# Patient Record
Sex: Female | Born: 1940 | Race: Black or African American | Hispanic: No | Smoking: Never smoker
Health system: Southern US, Community
[De-identification: ages and names within clinical notes are randomized; demographics above are authoritative.]

## PROBLEM LIST (undated history)

## (undated) DIAGNOSIS — I10 Essential (primary) hypertension: Secondary | ICD-10-CM

## (undated) DIAGNOSIS — F0390 Unspecified dementia without behavioral disturbance: Secondary | ICD-10-CM

## (undated) DIAGNOSIS — N189 Chronic kidney disease, unspecified: Secondary | ICD-10-CM

## (undated) DIAGNOSIS — Z87442 Personal history of urinary calculi: Secondary | ICD-10-CM

## (undated) DIAGNOSIS — M1990 Unspecified osteoarthritis, unspecified site: Secondary | ICD-10-CM

## (undated) HISTORY — PX: KNEE SURGERY: SHX244

---

## 2000-04-26 ENCOUNTER — Other Ambulatory Visit: Admission: RE | Admit: 2000-04-26 | Discharge: 2000-04-26 | Payer: Self-pay | Admitting: Family Medicine

## 2000-05-09 ENCOUNTER — Ambulatory Visit (HOSPITAL_COMMUNITY): Admission: RE | Admit: 2000-05-09 | Discharge: 2000-05-09 | Payer: Self-pay | Admitting: Family Medicine

## 2000-05-30 ENCOUNTER — Encounter: Admission: RE | Admit: 2000-05-30 | Discharge: 2000-05-30 | Payer: Self-pay | Admitting: Family Medicine

## 2000-05-30 ENCOUNTER — Encounter: Payer: Self-pay | Admitting: Family Medicine

## 2001-04-28 ENCOUNTER — Emergency Department (HOSPITAL_COMMUNITY): Admission: EM | Admit: 2001-04-28 | Discharge: 2001-04-28 | Payer: Self-pay

## 2001-04-28 ENCOUNTER — Encounter: Payer: Self-pay | Admitting: Emergency Medicine

## 2001-05-14 ENCOUNTER — Inpatient Hospital Stay (HOSPITAL_COMMUNITY): Admission: RE | Admit: 2001-05-14 | Discharge: 2001-05-17 | Payer: Self-pay | Admitting: Internal Medicine

## 2001-05-15 ENCOUNTER — Encounter: Payer: Self-pay | Admitting: Internal Medicine

## 2001-05-16 ENCOUNTER — Encounter: Payer: Self-pay | Admitting: Internal Medicine

## 2001-09-23 ENCOUNTER — Encounter: Admission: RE | Admit: 2001-09-23 | Discharge: 2001-09-23 | Payer: Self-pay | Admitting: Family Medicine

## 2001-09-23 ENCOUNTER — Encounter: Payer: Self-pay | Admitting: Family Medicine

## 2001-09-25 ENCOUNTER — Other Ambulatory Visit: Admission: RE | Admit: 2001-09-25 | Discharge: 2001-09-25 | Payer: Self-pay | Admitting: Family Medicine

## 2001-10-08 ENCOUNTER — Ambulatory Visit (HOSPITAL_COMMUNITY): Admission: RE | Admit: 2001-10-08 | Discharge: 2001-10-08 | Payer: Self-pay | Admitting: Family Medicine

## 2001-10-08 ENCOUNTER — Encounter: Payer: Self-pay | Admitting: Family Medicine

## 2002-06-29 ENCOUNTER — Encounter: Payer: Self-pay | Admitting: Emergency Medicine

## 2002-06-29 ENCOUNTER — Inpatient Hospital Stay (HOSPITAL_COMMUNITY): Admission: EM | Admit: 2002-06-29 | Discharge: 2002-07-02 | Payer: Self-pay | Admitting: Emergency Medicine

## 2007-03-08 ENCOUNTER — Ambulatory Visit: Payer: Self-pay | Admitting: Oncology

## 2007-03-27 LAB — CBC WITH DIFFERENTIAL/PLATELET
BASO%: 1.7 % (ref 0.0–2.0)
Eosinophils Absolute: 0 10*3/uL (ref 0.0–0.5)
MCV: 94.6 fL (ref 81.0–101.0)
MONO%: 11.9 % (ref 0.0–13.0)
NEUT#: 1.6 10*3/uL (ref 1.5–6.5)
RBC: 4.08 10*6/uL (ref 3.70–5.32)
RDW: 15 % — ABNORMAL HIGH (ref 11.3–14.5)
WBC: 3.2 10*3/uL — ABNORMAL LOW (ref 3.9–10.0)

## 2007-03-27 LAB — COMPREHENSIVE METABOLIC PANEL
AST: 17 U/L (ref 0–37)
Alkaline Phosphatase: 46 U/L (ref 39–117)
BUN: 36 mg/dL — ABNORMAL HIGH (ref 6–23)
Glucose, Bld: 95 mg/dL (ref 70–99)
Potassium: 4.4 mEq/L (ref 3.5–5.3)
Total Bilirubin: 0.7 mg/dL (ref 0.3–1.2)

## 2007-06-18 ENCOUNTER — Ambulatory Visit: Payer: Self-pay | Admitting: Oncology

## 2007-07-22 ENCOUNTER — Ambulatory Visit: Payer: Self-pay | Admitting: Oncology

## 2007-09-11 ENCOUNTER — Encounter: Admission: RE | Admit: 2007-09-11 | Discharge: 2007-09-11 | Payer: Self-pay | Admitting: Orthopedic Surgery

## 2007-10-10 ENCOUNTER — Inpatient Hospital Stay (HOSPITAL_COMMUNITY): Admission: RE | Admit: 2007-10-10 | Discharge: 2007-10-14 | Payer: Self-pay | Admitting: Orthopedic Surgery

## 2008-05-21 ENCOUNTER — Encounter: Admission: RE | Admit: 2008-05-21 | Discharge: 2008-05-21 | Payer: Self-pay | Admitting: Orthopedic Surgery

## 2009-04-28 ENCOUNTER — Encounter: Admission: RE | Admit: 2009-04-28 | Discharge: 2009-04-28 | Payer: Self-pay | Admitting: Orthopedic Surgery

## 2009-05-18 ENCOUNTER — Inpatient Hospital Stay (HOSPITAL_COMMUNITY): Admission: RE | Admit: 2009-05-18 | Discharge: 2009-05-23 | Payer: Self-pay | Admitting: Orthopedic Surgery

## 2009-05-22 ENCOUNTER — Ambulatory Visit: Payer: Self-pay | Admitting: Vascular Surgery

## 2009-05-22 ENCOUNTER — Encounter (INDEPENDENT_AMBULATORY_CARE_PROVIDER_SITE_OTHER): Payer: Self-pay | Admitting: Internal Medicine

## 2009-06-04 ENCOUNTER — Inpatient Hospital Stay (HOSPITAL_COMMUNITY): Admission: EM | Admit: 2009-06-04 | Discharge: 2009-06-07 | Payer: Self-pay | Admitting: Emergency Medicine

## 2009-06-05 ENCOUNTER — Encounter (INDEPENDENT_AMBULATORY_CARE_PROVIDER_SITE_OTHER): Payer: Self-pay | Admitting: Internal Medicine

## 2009-06-05 ENCOUNTER — Ambulatory Visit: Payer: Self-pay | Admitting: Vascular Surgery

## 2009-06-17 ENCOUNTER — Inpatient Hospital Stay (HOSPITAL_COMMUNITY): Admission: RE | Admit: 2009-06-17 | Discharge: 2009-06-25 | Payer: Self-pay | Admitting: Orthopedic Surgery

## 2009-06-21 ENCOUNTER — Ambulatory Visit: Payer: Self-pay | Admitting: Internal Medicine

## 2010-03-20 LAB — BODY FLUID CULTURE

## 2010-03-20 LAB — CBC
HCT: 27 % — ABNORMAL LOW (ref 36.0–46.0)
Hemoglobin: 10.6 g/dL — ABNORMAL LOW (ref 12.0–15.0)
Hemoglobin: 8.9 g/dL — ABNORMAL LOW (ref 12.0–15.0)
MCHC: 33.2 g/dL (ref 30.0–36.0)
MCV: 89.3 fL (ref 78.0–100.0)
MCV: 89.3 fL (ref 78.0–100.0)
Platelets: 206 10*3/uL (ref 150–400)
RBC: 3.02 MIL/uL — ABNORMAL LOW (ref 3.87–5.11)
RBC: 3.56 MIL/uL — ABNORMAL LOW (ref 3.87–5.11)
RDW: 21.1 % — ABNORMAL HIGH (ref 11.5–15.5)
WBC: 3.8 10*3/uL — ABNORMAL LOW (ref 4.0–10.5)

## 2010-03-20 LAB — COMPREHENSIVE METABOLIC PANEL WITH GFR
ALT: <8 U/L (ref 0–35)
AST: 17 U/L (ref 0–37)
Albumin: 2.9 g/dL — ABNORMAL LOW (ref 3.5–5.2)
Alkaline Phosphatase: 65 U/L (ref 39–117)
BUN: 18 mg/dL (ref 6–23)
CO2: 32 meq/L (ref 19–32)
Calcium: 8.5 mg/dL (ref 8.4–10.5)
Chloride: 97 meq/L (ref 96–112)
Creatinine, Ser: 1.37 mg/dL — ABNORMAL HIGH (ref 0.4–1.2)
GFR calc non Af Amer: 38 mL/min — ABNORMAL LOW
Glucose, Bld: 92 mg/dL (ref 70–99)
Potassium: 3.3 meq/L — ABNORMAL LOW (ref 3.5–5.1)
Sodium: 136 meq/L (ref 135–145)
Total Bilirubin: 0.7 mg/dL (ref 0.3–1.2)
Total Protein: 6.7 g/dL (ref 6.0–8.3)

## 2010-03-20 LAB — BASIC METABOLIC PANEL WITH GFR
BUN: 17 mg/dL (ref 6–23)
BUN: 17 mg/dL (ref 6–23)
CO2: 32 meq/L (ref 19–32)
CO2: 33 meq/L — ABNORMAL HIGH (ref 19–32)
Calcium: 7.7 mg/dL — ABNORMAL LOW (ref 8.4–10.5)
Calcium: 8.3 mg/dL — ABNORMAL LOW (ref 8.4–10.5)
Chloride: 96 meq/L (ref 96–112)
Chloride: 96 meq/L (ref 96–112)
Creatinine, Ser: 1.48 mg/dL — ABNORMAL HIGH (ref 0.4–1.2)
Creatinine, Ser: 1.49 mg/dL — ABNORMAL HIGH (ref 0.4–1.2)
GFR calc non Af Amer: 35 mL/min — ABNORMAL LOW
GFR calc non Af Amer: 35 mL/min — ABNORMAL LOW
Glucose, Bld: 100 mg/dL — ABNORMAL HIGH (ref 70–99)
Glucose, Bld: 93 mg/dL (ref 70–99)
Potassium: 3.4 meq/L — ABNORMAL LOW (ref 3.5–5.1)
Potassium: 3.4 meq/L — ABNORMAL LOW (ref 3.5–5.1)
Sodium: 133 meq/L — ABNORMAL LOW (ref 135–145)
Sodium: 138 meq/L (ref 135–145)

## 2010-03-20 LAB — PROTIME-INR
INR: 1.11 (ref 0.00–1.49)
INR: 1.15 (ref 0.00–1.49)
INR: 1.21 (ref 0.00–1.49)
INR: 1.4 (ref 0.00–1.49)
Prothrombin Time: 14.2 s (ref 11.6–15.2)
Prothrombin Time: 15.2 s (ref 11.6–15.2)
Prothrombin Time: 16.5 seconds — ABNORMAL HIGH (ref 11.6–15.2)
Prothrombin Time: 17 seconds — ABNORMAL HIGH (ref 11.6–15.2)
Prothrombin Time: 17.2 seconds — ABNORMAL HIGH (ref 11.6–15.2)

## 2010-03-20 LAB — GRAM STAIN

## 2010-03-20 LAB — APTT: aPTT: 31 s (ref 24–37)

## 2010-03-21 LAB — COMPREHENSIVE METABOLIC PANEL
ALT: 8 U/L (ref 0–35)
ALT: <8 U/L (ref 0–35)
AST: 20 U/L (ref 0–37)
AST: 22 U/L (ref 0–37)
Albumin: 2 g/dL — ABNORMAL LOW (ref 3.5–5.2)
BUN: 20 mg/dL (ref 6–23)
CO2: 23 mEq/L (ref 19–32)
CO2: 25 mEq/L (ref 19–32)
Chloride: 101 mEq/L (ref 96–112)
Chloride: 102 mEq/L (ref 96–112)
Creatinine, Ser: 1.5 mg/dL — ABNORMAL HIGH (ref 0.4–1.2)
Creatinine, Ser: 1.71 mg/dL — ABNORMAL HIGH (ref 0.4–1.2)
GFR calc Af Amer: 33 mL/min — ABNORMAL LOW (ref >60)
GFR calc Af Amer: 36 mL/min — ABNORMAL LOW (ref >60)
GFR calc non Af Amer: 27 mL/min — ABNORMAL LOW (ref >60)
GFR calc non Af Amer: 30 mL/min — ABNORMAL LOW (ref >60)
Glucose, Bld: 104 mg/dL — ABNORMAL HIGH (ref 70–99)
Sodium: 132 mEq/L — ABNORMAL LOW (ref 135–145)
Total Bilirubin: 1.1 mg/dL (ref 0.3–1.2)
Total Bilirubin: 1.2 mg/dL (ref 0.3–1.2)
Total Bilirubin: 1.8 mg/dL — ABNORMAL HIGH (ref 0.3–1.2)
Total Protein: 5 g/dL — ABNORMAL LOW (ref 6.0–8.3)

## 2010-03-21 LAB — DIFFERENTIAL
Basophils Absolute: 0 K/uL (ref 0.0–0.1)
Basophils Absolute: 0 10*3/uL (ref 0.0–0.1)
Basophils Absolute: 0 10*3/uL (ref 0.0–0.1)
Basophils Absolute: 0 10*3/uL (ref 0.0–0.1)
Basophils Relative: 1 % (ref 0–1)
Basophils Relative: 0 % (ref 0–1)
Eosinophils Absolute: 0.1 10*3/uL (ref 0.0–0.7)
Eosinophils Absolute: 0 10*3/uL (ref 0.0–0.7)
Eosinophils Absolute: 0 10*3/uL (ref 0.0–0.7)
Eosinophils Absolute: 0.1 10*3/uL (ref 0.0–0.7)
Eosinophils Relative: 2 % (ref 0–5)
Eosinophils Relative: 1 % (ref 0–5)
Eosinophils Relative: 2 % (ref 0–5)
Lymphocytes Relative: 21 % (ref 12–46)
Lymphocytes Relative: 13 % (ref 12–46)
Lymphocytes Relative: 7 % — ABNORMAL LOW (ref 12–46)
Lymphs Abs: 0.8 K/uL (ref 0.7–4.0)
Lymphs Abs: 0.4 10*3/uL — ABNORMAL LOW (ref 0.7–4.0)
Monocytes Absolute: 0.5 K/uL (ref 0.1–1.0)
Monocytes Absolute: 0.7 10*3/uL (ref 0.1–1.0)
Monocytes Relative: 13 % — ABNORMAL HIGH (ref 3–12)
Monocytes Relative: 15 % — ABNORMAL HIGH (ref 3–12)
Neutro Abs: 2.6 K/uL (ref 1.7–7.7)
Neutro Abs: 3.2 10*3/uL (ref 1.7–7.7)
Neutro Abs: 4.7 10*3/uL (ref 1.7–7.7)
Neutrophils Relative %: 64 % (ref 43–77)
Neutrophils Relative %: 79 % — ABNORMAL HIGH (ref 43–77)

## 2010-03-21 LAB — CARDIAC PANEL(CRET KIN+CKTOT+MB+TROPI)
CK, MB: 1.6 ng/mL (ref 0.3–4.0)
Relative Index: 1.1 (ref 0.0–2.5)
Relative Index: 1.3 (ref 0.0–2.5)
Total CK: 76 U/L (ref 7–177)
Troponin I: 0.01 ng/mL (ref 0.00–0.06)
Troponin I: 0.02 ng/mL (ref 0.00–0.06)

## 2010-03-21 LAB — CBC
HCT: 20.8 % — ABNORMAL LOW (ref 36.0–46.0)
HCT: 22.9 % — ABNORMAL LOW (ref 36.0–46.0)
HCT: 28 % — ABNORMAL LOW (ref 36.0–46.0)
HCT: 21.9 % — ABNORMAL LOW (ref 36.0–46.0)
HCT: 22.9 % — ABNORMAL LOW (ref 36.0–46.0)
HCT: 23 % — ABNORMAL LOW (ref 36.0–46.0)
HCT: 24.8 % — ABNORMAL LOW (ref 36.0–46.0)
Hemoglobin: 6.9 g/dL — CL (ref 12.0–15.0)
Hemoglobin: 7.5 g/dL — ABNORMAL LOW (ref 12.0–15.0)
Hemoglobin: 9.3 g/dL — ABNORMAL LOW (ref 12.0–15.0)
Hemoglobin: 7.9 g/dL — ABNORMAL LOW (ref 12.0–15.0)
MCHC: 33.3 g/dL (ref 30.0–36.0)
MCHC: 34 g/dL (ref 30.0–36.0)
MCHC: 34.5 g/dL (ref 30.0–36.0)
MCV: 96.8 fL (ref 78.0–100.0)
MCV: 92.3 fL (ref 78.0–100.0)
MCV: 93.6 fL (ref 78.0–100.0)
MCV: 94.8 fL (ref 78.0–100.0)
MCV: 95.5 fL (ref 78.0–100.0)
MCV: 96.1 fL (ref 78.0–100.0)
MCV: 97.6 fL (ref 78.0–100.0)
Platelets: 363 10*3/uL (ref 150–400)
Platelets: 106 10*3/uL — ABNORMAL LOW (ref 150–400)
Platelets: 83 10*3/uL — ABNORMAL LOW (ref 150–400)
Platelets: 87 10*3/uL — ABNORMAL LOW (ref 150–400)
Platelets: 88 10*3/uL — ABNORMAL LOW (ref 150–400)
Platelets: 93 10*3/uL — ABNORMAL LOW (ref 150–400)
RBC: 2.15 MIL/uL — ABNORMAL LOW (ref 3.87–5.11)
RBC: 2.53 MIL/uL — ABNORMAL LOW (ref 3.87–5.11)
RBC: 3.06 MIL/uL — ABNORMAL LOW (ref 3.87–5.11)
RBC: 2.41 MIL/uL — ABNORMAL LOW (ref 3.87–5.11)
RBC: 2.54 MIL/uL — ABNORMAL LOW (ref 3.87–5.11)
RDW: 20.1 % — ABNORMAL HIGH (ref 11.5–15.5)
RDW: 23.8 % — ABNORMAL HIGH (ref 11.5–15.5)
RDW: 15.3 % (ref 11.5–15.5)
RDW: 16 % — ABNORMAL HIGH (ref 11.5–15.5)
RDW: 17.2 % — ABNORMAL HIGH (ref 11.5–15.5)
WBC: 3.7 10*3/uL — ABNORMAL LOW (ref 4.0–10.5)
WBC: 4 K/uL (ref 4.0–10.5)
WBC: 4.2 10*3/uL (ref 4.0–10.5)
WBC: 5 10*3/uL (ref 4.0–10.5)
WBC: 5.3 10*3/uL (ref 4.0–10.5)
WBC: 5.9 10*3/uL (ref 4.0–10.5)

## 2010-03-21 LAB — BASIC METABOLIC PANEL
BUN: 20 mg/dL (ref 6–23)
BUN: 21 mg/dL (ref 6–23)
BUN: 22 mg/dL (ref 6–23)
BUN: 22 mg/dL (ref 6–23)
CO2: 27 mEq/L (ref 19–32)
CO2: 22 mEq/L (ref 19–32)
Calcium: 8.1 mg/dL — ABNORMAL LOW (ref 8.4–10.5)
Calcium: 8.3 mg/dL — ABNORMAL LOW (ref 8.4–10.5)
Chloride: 99 mEq/L (ref 96–112)
Chloride: 100 mEq/L (ref 96–112)
Chloride: 101 mEq/L (ref 96–112)
Creatinine, Ser: 1.27 mg/dL — ABNORMAL HIGH (ref 0.4–1.2)
Creatinine, Ser: 1.68 mg/dL — ABNORMAL HIGH (ref 0.4–1.2)
Creatinine, Ser: 1.79 mg/dL — ABNORMAL HIGH (ref 0.4–1.2)
GFR calc Af Amer: 49 mL/min — ABNORMAL LOW (ref >60)
GFR calc Af Amer: 51 mL/min — ABNORMAL LOW (ref >60)
GFR calc Af Amer: 34 mL/min — ABNORMAL LOW (ref >60)
GFR calc non Af Amer: 40 mL/min — ABNORMAL LOW (ref >60)
GFR calc non Af Amer: 28 mL/min — ABNORMAL LOW (ref >60)
Glucose, Bld: 108 mg/dL — ABNORMAL HIGH (ref 70–99)
Glucose, Bld: 85 mg/dL (ref 70–99)
Glucose, Bld: 108 mg/dL — ABNORMAL HIGH (ref 70–99)
Glucose, Bld: 144 mg/dL — ABNORMAL HIGH (ref 70–99)
Potassium: 3.6 mEq/L (ref 3.5–5.1)
Potassium: 4.8 mEq/L (ref 3.5–5.1)
Potassium: 4.8 mEq/L (ref 3.5–5.1)
Potassium: 5.9 mEq/L — ABNORMAL HIGH (ref 3.5–5.1)
Sodium: 135 mEq/L (ref 135–145)

## 2010-03-21 LAB — MAGNESIUM
Magnesium: 1.8 mg/dL (ref 1.5–2.5)
Magnesium: 1.8 mg/dL (ref 1.5–2.5)

## 2010-03-21 LAB — CROSSMATCH
ABO/RH(D): O POS
ABO/RH(D): O POS
Antibody Screen: NEGATIVE

## 2010-03-21 LAB — PROTIME-INR
INR: 2.24 — ABNORMAL HIGH (ref 0.00–1.49)
INR: 2.74 — ABNORMAL HIGH (ref 0.00–1.49)
INR: 3.54 — ABNORMAL HIGH (ref 0.00–1.49)
INR: 4.08 — ABNORMAL HIGH (ref 0.00–1.49)
INR: 2.03 — ABNORMAL HIGH (ref 0.00–1.49)
Prothrombin Time: 39.3 seconds — ABNORMAL HIGH (ref 11.6–15.2)
Prothrombin Time: 22.8 seconds — ABNORMAL HIGH (ref 11.6–15.2)

## 2010-03-21 LAB — SAMPLE TO BLOOD BANK

## 2010-03-21 LAB — BLOOD GAS, ARTERIAL
Bicarbonate: 21.4 mEq/L (ref 20.0–24.0)
FIO2: 0.21 %
Patient temperature: 98.6
pCO2 arterial: 42.2 mmHg (ref 35.0–45.0)
pH, Arterial: 7.326 — ABNORMAL LOW (ref 7.350–7.400)

## 2010-03-21 LAB — BASIC METABOLIC PANEL WITH GFR
Calcium: 8.4 mg/dL (ref 8.4–10.5)
GFR calc non Af Amer: 42 mL/min — ABNORMAL LOW (ref >60)
Potassium: 4 meq/L (ref 3.5–5.1)
Sodium: 134 meq/L — ABNORMAL LOW (ref 135–145)

## 2010-03-21 LAB — PREPARE RBC (CROSSMATCH)

## 2010-03-21 LAB — HEMOCCULT GUIAC POC 1CARD (OFFICE): Fecal Occult Bld: NEGATIVE

## 2010-03-21 LAB — HEMOGLOBIN AND HEMATOCRIT, BLOOD: Hemoglobin: 8 g/dL — ABNORMAL LOW (ref 12.0–15.0)

## 2010-03-22 LAB — CBC
HCT: 34.3 % — ABNORMAL LOW (ref 36.0–46.0)
Platelets: 115 10*3/uL — ABNORMAL LOW (ref 150–400)
WBC: 2.9 10*3/uL — ABNORMAL LOW (ref 4.0–10.5)

## 2010-03-22 LAB — TYPE AND SCREEN
ABO/RH(D): O POS
Antibody Screen: NEGATIVE

## 2010-03-22 LAB — URINE MICROSCOPIC-ADD ON

## 2010-03-22 LAB — COMPREHENSIVE METABOLIC PANEL
ALT: 16 U/L (ref 0–35)
Albumin: 3.4 g/dL — ABNORMAL LOW (ref 3.5–5.2)
Alkaline Phosphatase: 53 U/L (ref 39–117)
BUN: 24 mg/dL — ABNORMAL HIGH (ref 6–23)
Chloride: 108 mEq/L (ref 96–112)
Glucose, Bld: 97 mg/dL (ref 70–99)
Potassium: 3.9 mEq/L (ref 3.5–5.1)
Sodium: 140 mEq/L (ref 135–145)
Total Bilirubin: 1.3 mg/dL — ABNORMAL HIGH (ref 0.3–1.2)

## 2010-03-22 LAB — URINALYSIS, ROUTINE W REFLEX MICROSCOPIC
Bilirubin Urine: NEGATIVE
Glucose, UA: NEGATIVE mg/dL
Specific Gravity, Urine: 1.018 (ref 1.005–1.030)
Urobilinogen, UA: 0.2 mg/dL (ref 0.0–1.0)

## 2010-03-22 LAB — PROTIME-INR
INR: 0.98 (ref 0.00–1.49)
Prothrombin Time: 12.9 seconds (ref 11.6–15.2)

## 2010-05-06 ENCOUNTER — Inpatient Hospital Stay (HOSPITAL_COMMUNITY)
Admission: AD | Admit: 2010-05-06 | Discharge: 2010-05-11 | DRG: 488 | Disposition: A | Discharge status: Home Health | Payer: Medicare Other | Source: Ambulatory Visit | Attending: Orthopedic Surgery | Admitting: Orthopedic Surgery

## 2010-05-06 DIAGNOSIS — N289 Disorder of kidney and ureter, unspecified: Secondary | ICD-10-CM | POA: Diagnosis present

## 2010-05-06 DIAGNOSIS — T84039A Mechanical loosening of unspecified internal prosthetic joint, initial encounter: Secondary | ICD-10-CM | POA: Diagnosis present

## 2010-05-06 DIAGNOSIS — M25069 Hemarthrosis, unspecified knee: Secondary | ICD-10-CM | POA: Diagnosis present

## 2010-05-06 DIAGNOSIS — T8489XA Other specified complication of internal orthopedic prosthetic devices, implants and grafts, initial encounter: Principal | ICD-10-CM | POA: Diagnosis present

## 2010-05-06 DIAGNOSIS — Z96659 Presence of unspecified artificial knee joint: Secondary | ICD-10-CM

## 2010-05-06 DIAGNOSIS — H9190 Unspecified hearing loss, unspecified ear: Secondary | ICD-10-CM | POA: Diagnosis present

## 2010-05-06 DIAGNOSIS — M129 Arthropathy, unspecified: Secondary | ICD-10-CM | POA: Diagnosis present

## 2010-05-06 DIAGNOSIS — Y831 Surgical operation with implant of artificial internal device as the cause of abnormal reaction of the patient, or of later complication, without mention of misadventure at the time of the procedure: Secondary | ICD-10-CM | POA: Diagnosis present

## 2010-05-06 DIAGNOSIS — I10 Essential (primary) hypertension: Secondary | ICD-10-CM | POA: Diagnosis present

## 2010-05-06 LAB — CBC
Hemoglobin: 10.7 g/dL — ABNORMAL LOW (ref 12.0–15.0)
MCH: 31.1 pg (ref 26.0–34.0)
MCHC: 31.6 g/dL (ref 30.0–36.0)
Platelets: 186 10*3/uL (ref 150–400)
RDW: 16.1 % — ABNORMAL HIGH (ref 11.5–15.5)

## 2010-05-06 LAB — SEDIMENTATION RATE: Sed Rate: 20 mm/hr (ref 0–22)

## 2010-05-06 LAB — COMPREHENSIVE METABOLIC PANEL
ALT: 5 U/L (ref 0–35)
BUN: 24 mg/dL — ABNORMAL HIGH (ref 6–23)
CO2: 30 mEq/L (ref 19–32)
Calcium: 9.1 mg/dL (ref 8.4–10.5)
Creatinine, Ser: 1.41 mg/dL — ABNORMAL HIGH (ref 0.4–1.2)
GFR calc non Af Amer: 37 mL/min — ABNORMAL LOW (ref >60)
Glucose, Bld: 96 mg/dL (ref 70–99)
Sodium: 141 mEq/L (ref 135–145)
Total Protein: 7 g/dL (ref 6.0–8.3)

## 2010-05-06 LAB — URIC ACID: Uric Acid, Serum: 7.6 mg/dL — ABNORMAL HIGH (ref 2.4–7.0)

## 2010-05-06 LAB — PROTIME-INR
INR: 0.97 (ref 0.00–1.49)
INR: 0.97 (ref 0.00–1.49)
Prothrombin Time: 13.1 seconds (ref 11.6–15.2)

## 2010-05-07 ENCOUNTER — Inpatient Hospital Stay (HOSPITAL_COMMUNITY): Payer: Medicare Other

## 2010-05-07 DIAGNOSIS — M79609 Pain in unspecified limb: Secondary | ICD-10-CM

## 2010-05-07 LAB — SYNOVIAL CELL COUNT + DIFF, W/ CRYSTALS
Monocyte-Macrophage-Synovial Fluid: 2 % — ABNORMAL LOW (ref 50–90)
Neutrophil, Synovial: 70 % — ABNORMAL HIGH (ref 0–25)
WBC, Synovial: 2098 /mm3 — ABNORMAL HIGH (ref 0–200)

## 2010-05-09 ENCOUNTER — Inpatient Hospital Stay (HOSPITAL_COMMUNITY): Payer: Medicare Other

## 2010-05-09 LAB — PROTIME-INR: INR: 1.05 (ref 0.00–1.49)

## 2010-05-09 LAB — BASIC METABOLIC PANEL
Calcium: 8.4 mg/dL (ref 8.4–10.5)
Creatinine, Ser: 1.42 mg/dL — ABNORMAL HIGH (ref 0.4–1.2)
GFR calc Af Amer: 44 mL/min — ABNORMAL LOW (ref >60)
GFR calc non Af Amer: 37 mL/min — ABNORMAL LOW (ref >60)
Sodium: 140 mEq/L (ref 135–145)

## 2010-05-09 MED ORDER — TECHNETIUM TC 99M MEDRONATE IV KIT
25.0000 | PACK | Freq: Once | INTRAVENOUS | Status: AC | PRN
  Administered 2010-05-09: 25 via INTRAVENOUS

## 2010-05-10 ENCOUNTER — Inpatient Hospital Stay (HOSPITAL_COMMUNITY): Payer: Medicare Other

## 2010-05-10 LAB — BODY FLUID CULTURE: Culture: NO GROWTH

## 2010-05-10 LAB — PROTIME-INR
INR: 1.04 (ref 0.00–1.49)
Prothrombin Time: 13.8 seconds (ref 11.6–15.2)

## 2010-05-11 LAB — ANAEROBIC CULTURE

## 2010-05-11 LAB — CULTURE, ROUTINE-ABSCESS

## 2010-05-13 LAB — ANAEROBIC CULTURE

## 2010-05-17 NOTE — Op Note (Signed)
Sharon Ramos, Sharon Ramos            ACCOUNT NO.:  [id number]   MEDICAL RECORD NO.:  [id number]          PATIENT TYPE:  INP   LOCATION:  5041                         FACILITY:  [facility]   PHYSICIAN:  Myrtie Neither, MD      DATE OF BIRTH:  04-11-1940   DATE OF PROCEDURE:  10/10/2007  DATE OF DISCHARGE:                               OPERATIVE REPORT   PREOPERATIVE DIAGNOSIS:  Degenerative joint disease, left knee.   POSTOPERATIVE DIAGNOSIS:  Degenerative joint disease, left knee.   ANESTHESIA:  General.   PROCEDURE:  Left total knee arthroplasty, Biomet implant.  The patient  was taken to the operating room.  After giving the adequate preop  medication, general anesthesia was intubated.  Left lower extremity was  prepped with DuraPrep and draped in sterile manner.  Tourniquet and  Bovie was used for hemostasis.  Anterior midline incision was made over  the left knee.  Going through the skin and subcutaneous tissue, a  paramedian incision was made from about the capsule.  Patella reflected  laterally.  Osteophytes about the patella, femur, and tibia were  resected.  Soft tissue resection was done.  The patient had varus  deformity.  Medial soft-tissue release was done to bring the knee into a  neutral to slight valgus position.  With the knee in a flexed position,  10 mm of the tibial plateau surface was resected with the tibial guide  followed by reaming down the femoral canal and distal femoral jig put in  place.  An appropriate cut for the distal femur was done.  Sizing was  that of 72.5 mm.  An appropriate 72.5-mm jig was put in place.  Anterior  and posterior cuts and chamfer cuts were done.  Further soft tissue  resection about the femur was also done.  Multiple loose bodies were in  the joint and were removed.  Femoral component was press fitted and  found to be a good snug fit.  Attention was then turned back to the  tibia, which was sized at 75-mm and appropriate cutting jig for  the  tibial surface was done.  Trial for the tibia and femur were put in  place.  Range of motion was full extension, full flexion with good  medial and lateral stability with the 12-mm poly.  Sizing of the patella  was then done, which was a large 37 mm and the appropriate cutting jig  was put in place, and surfacing was done.  With all 3 components in  place, there was full extension, good flexion, good medial and lateral  stability, and no subluxation of the patella was noted.  Trial  components were removed.  Irrigation was then done with the same pulse  irrigator.  Methyl methacrylate was mixed.  The patella and the tibial  components were cemented and the femoral component was press fitted.  After methyl methacrylate had set an excess methyl methacrylate was  removed.  A 12-mm trial poly was then put back in place and again full  extension, full flexion, good medial lateral stability, and no  subluxation of the patella.  Final 12-mm poly was then put in place and  locked.  A tourniquet was let down.  Hemostasis obtained.  Further  irrigation was done.  Wound closure was then done with 0 Vicryl for the  fascia, 2-0 for the subcutaneous and skin staples for the skin.  Notation of the allergic skin reaction was noted along the incision from  the iodine drape.  Triple antibiotic ointment was applied to the  incision area.  Compressive dressing was applied.  The patient tolerated  procedure quite well in the recovery room in stable and satisfactory  condition.  The patient's blood loss approximately 250 mL.     Myrtie Neither, MD  Electronically Signed    AC/MEDQ  D:  10/10/2007  T:  10/10/2007  Job:  [id number]

## 2010-05-19 ENCOUNTER — Inpatient Hospital Stay (HOSPITAL_COMMUNITY)
Admission: AD | Admit: 2010-05-19 | Discharge: 2010-05-25 | DRG: 488 | Disposition: A | Discharge status: Home / Self Care | Payer: Medicare Other | Source: Ambulatory Visit | Attending: Orthopedic Surgery | Admitting: Orthopedic Surgery

## 2010-05-19 DIAGNOSIS — I252 Old myocardial infarction: Secondary | ICD-10-CM

## 2010-05-19 DIAGNOSIS — R233 Spontaneous ecchymoses: Secondary | ICD-10-CM | POA: Diagnosis present

## 2010-05-19 DIAGNOSIS — IMO0002 Reserved for concepts with insufficient information to code with codable children: Secondary | ICD-10-CM | POA: Diagnosis present

## 2010-05-19 DIAGNOSIS — M25069 Hemarthrosis, unspecified knee: Principal | ICD-10-CM | POA: Diagnosis present

## 2010-05-19 DIAGNOSIS — T45515A Adverse effect of anticoagulants, initial encounter: Secondary | ICD-10-CM | POA: Diagnosis present

## 2010-05-19 DIAGNOSIS — H9190 Unspecified hearing loss, unspecified ear: Secondary | ICD-10-CM | POA: Diagnosis present

## 2010-05-19 DIAGNOSIS — Y998 Other external cause status: Secondary | ICD-10-CM | POA: Diagnosis present

## 2010-05-19 DIAGNOSIS — B965 Pseudomonas (aeruginosa) (mallei) (pseudomallei) as the cause of diseases classified elsewhere: Secondary | ICD-10-CM | POA: Diagnosis present

## 2010-05-19 DIAGNOSIS — I10 Essential (primary) hypertension: Secondary | ICD-10-CM | POA: Diagnosis present

## 2010-05-19 DIAGNOSIS — D62 Acute posthemorrhagic anemia: Secondary | ICD-10-CM | POA: Diagnosis not present

## 2010-05-19 NOTE — Op Note (Signed)
  NAME:  Sharon Ramos, Sharon Ramos            ACCOUNT NO.:  [id number]  MEDICAL RECORD NO.:  [id number]           PATIENT TYPE:  I  LOCATION:  5005                         FACILITY:  [facility]  PHYSICIAN:  Myrtie Neither, MD      DATE OF BIRTH:  19-Nov-1940  DATE OF PROCEDURE:  05/08/2010 DATE OF DISCHARGE:                              OPERATIVE REPORT   PREOPERATIVE DIAGNOSIS:  Hemarthrosis, right knee, rule out infection of right knee.  POSTOPERATIVE DIAGNOSIS:  Hemarthrosis, right knee, rule out infection of right knee.  ANESTHESIA:  General.  PROCEDURE:  Arthrotomy, right knee and application of Hemovac drain, right knee.  The patient was taken to the operating room after given adequate preoperative medications, given general anesthesia and intubated.  Right lower extremity was prepped with DuraPrep and draped in sterile manner. Tourniquet and Bovie used for hemostasis.  Anterior midline incision made going through old previous scar going through the skin and subcutaneous tissue and medial paramedian incision made into the capsule.  Immediately after opening the capsule, large hematoma was evacuated from the knee extending suprapatellar pouch and no evidence of any purulent material.  The subcutaneous tissue also appeared to be bruised anteriorly over the knee.  Inspection did not reveal any particular source of bleeding.  Copious irrigation with a Simpulse irrigator was done.  Aerobic and anaerobic cultures were also done after adequate evacuation of hematoma and some debridement of the synovium. Hemostasis was obtained.  Further irrigation was then done.  Wound closure was then done with 0 Vicryl for the fascia, 2-0 for the subcutaneous, and skin staples for the skin.  Large Hemovac drain was placed into the knee.  Compressive dressing was applied.  The patient tolerated the procedure quite well, went to recovery room in stable and satisfactory condition.     Myrtie Neither,  MD     AC/MEDQ  D:  05/08/2010  T:  05/08/2010  Job:  [id number]  Electronically Signed by Myrtie Neither MD on 05/19/2010 03:24:09 PM

## 2010-05-19 NOTE — Discharge Summary (Signed)
NAME:  Sharon Ramos, WINDHORST            ACCOUNT NO.:  [id number]  MEDICAL RECORD NO.:  [id number]           PATIENT TYPE:  I  LOCATION:  5005                         FACILITY:  [facility]  PHYSICIAN:  Myrtie Neither, MD      DATE OF BIRTH:  09-06-1940  DATE OF ADMISSION:  05/06/2010 DATE OF DISCHARGE:  05/11/2010                              DISCHARGE SUMMARY   ADMITTING DIAGNOSES:  Painful right total knee implant, effusion, rule out infection, rule out loosening right total knee, venous insufficiency, rule out DVT, history of gouty arthropathy.  DISCHARGE DIAGNOSIS:  Hemarthrosis, right total knee, loosening right total knee, rule out infection in the right total knee.  COMPLICATIONS:  None.  INFECTIONS:  No growth, right knee.  PROCEDURES:  Arthrocentesis, right knee; arthrotomy, right knee; gram stain cultures, aerobic and anaerobic done.  PICC line placed in the right arm for home IV antibiotics.  PERTINENT HISTORY:  This is a 70 year old female, deaf patient, who underwent right total knee arthroplasty last year.  The patient recently developed gouty arthropathy involving the right knee with pain, swelling, and effusion.  The patient was treated with Decadron with colchicine, allopurinol, the patient's symptoms subsided.  The patient had a uric acid of 9.7 two to three weeks ago.  The patient returned to the office on May 06, 2010 with severe onset of pain, swelling, inability to weightbear within a 24-hour period.  The patient was admitted to the hospital for extensive leg swelling, pain, swelling of the right total knee, for ruling out of infection right knee, loosening right total knee, rule out DVT right lower extremity.  Pertinent physical was that of the right knee of +3 effusion, markedly with tender, limited range of motion, no increased warmth or redness, tender right lower leg, trace Homans' test, swelling all the way down to the dorsal of the foot.  HOSPITAL COURSE:   The patient was admitted to hospital and underwent arthrocentesis, had 60 mL of dark non-coagulated blood, this was sent for cultures and sensitivity, Gram stain, and uric acid crystals.  The patient's laboratory revealed a uric acid of 7.6, which is down from 9.7.  Labs on her fluid revealed an elevated white blood cell count of 2098, no crystals, no organisms.  The patient's INR was 0.97, sed rate was 20.  CBC:  White blood cell count was 6.7, H and H was 10.7 and 33.9.  The patient had previous CT scan of the right knee, which did not show any evidence of loosening.  Venous Doppler revealed no evidence of DVT.  The patient was taken to the operating room on May 08, 2010, and underwent arthrotomy.  The patient had evacuation of large hematoma from the knee.  No evidence of any purulent material.  The patient had debridement, irrigation, and placement of Hemovac wound.  Cultures again with aerobic and anaerobic and gram stain.  The patients wound was closed with the use of Hemovac.  Postop course pain became uncontrolled. The patient remained afebrile.  The patient was started on IV vancomycin postoperatively.  The patient was also started on prophylactic Coumadin and started with physical therapy,  weightbearing as tolerated on the right knee.  The patient's bone scan was read as loosened total knee arthroplasty, possible infection.  Specimen and cultures did not show any growth so for.  Pain is under control.  Wound is healing quite well due to the fact that did not show what bacterium would be retreating if any.  We will discharge the patient on PICC line with continued vancomycin and also we will use rifampin presently considering 3 weeks until final cultures are seen.  The patient will be discharged on Coumadin 5 mg daily, Percocet one q.8 p.r.n., Feosol 325 mg b.i.d.  The patient's daughter Milda Smart was instructed as to care the mother.  Home health has been arranged for IV antibiotics  as well as care of the PICC line and checking of INR as well as her Coumadin dose. The patient being discharged in stable and satisfactory condition.     Myrtie Neither, MD     AC/MEDQ  D:  05/11/2010  T:  05/12/2010  Job:  [id number]  Electronically Signed by Myrtie Neither MD on 05/19/2010 03:24:04 PM

## 2010-05-20 LAB — COMPREHENSIVE METABOLIC PANEL
AST: 12 U/L (ref 0–37)
Albumin: 3 g/dL — ABNORMAL LOW (ref 3.5–5.2)
BUN: 30 mg/dL — ABNORMAL HIGH (ref 6–23)
Calcium: 8.4 mg/dL (ref 8.4–10.5)
Creatinine, Ser: 1.63 mg/dL — ABNORMAL HIGH (ref 0.4–1.2)
GFR calc Af Amer: 38 mL/min — ABNORMAL LOW (ref >60)
Total Bilirubin: 0.2 mg/dL — ABNORMAL LOW (ref 0.3–1.2)
Total Protein: 6 g/dL (ref 6.0–8.3)

## 2010-05-20 LAB — URINALYSIS, ROUTINE W REFLEX MICROSCOPIC
Glucose, UA: NEGATIVE mg/dL
Protein, ur: NEGATIVE mg/dL
Specific Gravity, Urine: 1.011 (ref 1.005–1.030)
pH: 6 (ref 5.0–8.0)

## 2010-05-20 LAB — CBC
HCT: 23.8 % — ABNORMAL LOW (ref 36.0–46.0)
MCH: 30.8 pg (ref 26.0–34.0)
MCV: 94.1 fL (ref 78.0–100.0)
Platelets: 164 10*3/uL (ref 150–400)
RBC: 2.53 MIL/uL — ABNORMAL LOW (ref 3.87–5.11)
RDW: 16.1 % — ABNORMAL HIGH (ref 11.5–15.5)
WBC: 3.4 10*3/uL — ABNORMAL LOW (ref 4.0–10.5)

## 2010-05-20 LAB — PROTIME-INR: INR: 1.08 (ref 0.00–1.49)

## 2010-05-20 LAB — HEMOGLOBIN AND HEMATOCRIT, BLOOD: Hemoglobin: 9 g/dL — ABNORMAL LOW (ref 12.0–15.0)

## 2010-05-20 LAB — SURGICAL PCR SCREEN
MRSA, PCR: NEGATIVE
Staphylococcus aureus: NEGATIVE

## 2010-05-20 LAB — APTT: aPTT: 30 seconds (ref 24–37)

## 2010-05-20 NOTE — Discharge Summary (Signed)
NAME:  Sharon Ramos, Sharon Ramos                      ACCOUNT NO.:  [id number]   MEDICAL RECORD NO.:  [id number]                   PATIENT TYPE:  INP   LOCATION:  0363                                 FACILITY:  [facility]   PHYSICIAN:  Lilyan Punt. Sydnee Levans, M.D.             DATE OF BIRTH:  January 09, 1940   DATE OF ADMISSION:  06/28/2002  DATE OF DISCHARGE:  07/02/2002                                 DISCHARGE SUMMARY   DISCHARGE DIAGNOSES:  1. Pyuria - urinary culture subsequently grew Escherichia coli.  2. Renal failure, acute, secondary to dehydration, hypertension, and     presumed thiazide diuretic treatment,  in the setting of urinary tract     infection.  3. Anemia.  4. Hypertension.  5. Diarrhea and vomiting, resolved.  6. Deafness.  7. Sciatica.   PROCEDURE/DATE/FINDINGS:  1. CT of the abdomen on June 29, 2002, indications of ARF, hypertension,     vomiting and diarrhea.  The findings include cholelithiasis, small     hepatic cyst and a 1.0 cm low-density lesion in the lateral aspect of the     left kidney which was nonspecific, which may be followed up as an     outpatient with an ultrasound for further characterization.  2. A CT of the pelvis on June 29, 2002:  Impression was no significant     abnormality.  3. A chest x-ray on June 29, 2002:  Mild to moderate cardiomegaly, with     prominence of interstitial pulmonary markings likely secondary to     pulmonary edema and hypertension, though there is no evidence of frank     edema or pleural effusion, nor is there evidence of an acute pulmonary     infiltrate.   HISTORY OF PRESENT ILLNESS:  This is a 70 year old black female who normally  sees Dr. Darl Pikes Drinkard at [facility].  She has a history of hypertension,  treated with thiazide-type diuretics per available records.  She presented  to [facility] with low back pain, a constellation of  subjective fevers, a diminished appetite, increased vomiting and  diarrhea of  two days' duration.  There was also mild dysuria and passage of foul-  smelling urine.  See also the admit note by Dr. Hortencia Pilar.   LABORATORY DATA:  The admit labs are notable for a white blood cell count of  13.3, hemoglobin 12.2, hematocrit 34.9.  BMET showed a sodium of 130,  potassium 2.6, elevated BUN at 7.4, creatinine 4.3.  A urinalysis showed  pyuria and subsequently grew out E. coli.   HOSPITAL COURSE:  She was admitted for further evaluation and treatment.  She was empirically begun on renally-based IV Cipro for the treatment of  uropathogens.  She was given IV rehydration.  A CT of the kidneys with the  findings as noted above.  Her back pain was reproducible on palpation, and  seeming more consistent with sciatica.  Hypokalemia was corrected with IV  fluids.  Over the ensuing days in the hospital she appeared to respond to IV  hydration and antibiotics.  She had a low-grade fever which resolved.  The  diarrhea resolved.  The renal status improved gradually, and at the present  time her creatinine is 2.8, BUN 51, potassium 3.8, sodium 136.   FOLLOW UP:  She will have further followup through [facility], as well as  nephrology.  She will continue Cipro for an additional three days in the outpatient  setting.    DISCHARGE MEDICATIONS:  1. Cipro 500 mg b.i.d. for three additional days.  2. Antihypertensive - This was discontinued this hospitalization because of     her renal status.  She understands the need for further followup.                                               Lilyan Punt Sydnee Levans, M.D.    KCS/MEDQ  D:  07/02/2002  T:  07/02/2002  Job:  [id number]   cc:   [facility]

## 2010-05-20 NOTE — H&P (Signed)
[place]. [facility]  Patient:    Sharon Ramos, Sharon Ramos Visit Number: [id number] MRN: [id number]          Service Type: MED Location: [phone] Attending Physician:  Dolores Patty Dictated by:   Titus Dubin. Alwyn Ren, M.D. [facility] Admit Date:  05/14/2001   CC:         Sharon Ramos. Audria Nine, M.D., [facility]   History and Physical  DATE OF BIRTH:  11-18-1940  HISTORY OF PRESENT ILLNESS:  Sharon Ramos is a 70 year old African American deaf female with calf pain and dramatic pedal edema.  She had fallen and injured her ankle and was placed on naproxen, apparently. The injury apparently was approximately two weeks prior to admission; those records are not in the [facility] chart.  She now presents with progressive edema which she attributes to the nonsteroidal anti-inflammatory agent.  In actuality, she has not taken the naproxen for one week.  The original injury was apparently on April 26th.  There was no evidence of acute fracture but there was periosteal elevation associated with the medial malleolus.  An underlying neoplastic process could not be excluded.  An MRI was recommended.  There is no record of such being completed.  Degenerative changes were also noted.  In the clinic, she was noted to have 2+ edema of the feet and marked tenderness of the left calf superiorly, suggesting a positive Homans.  She was admitted for probable deep venous thrombosis.  PAST MEDICAL HISTORY:  Past medical history includes seven pregnancies and six deliveries and one lost pregnancy.  SOCIAL HISTORY:  She does not drink or smoke.  She has a supportive family; her granddaughter signs for her.  FAMILY HISTORY:  Her mother died of cancer and father died of myocardial infarction.  ALLERGIES:  There are no known drug allergies.  MEDICATIONS:  She is on no medicines at present.  REVIEW OF SYSTEMS:  Review of systems is otherwise negative.   She had been on a diuretic in the past according to the [facility] notes.  PHYSICAL EXAMINATION:  VITAL SIGNS:  She is 5 feet 6 inches and weighs 268 pounds.  Temperature was 97, pulse was 82 and respiratory rate 20.  Blood pressure was 146/82 on the left and 140/80 on the right.  HEENT:  Arteriolar narrowing is noted.  Tympanic membranes are dull but there are no otic canal obstructions noted.  Dental hygiene is noted.  She is deaf, as noted.  LUNGS:  Breath sounds are decreased but there is no increased work of breathing.  An S4 is noted.  She moves slowly with a shuffling-type gait and uses a quad cane.  ABDOMEN:  Protuberant with doughy consistency.  EXTREMITIES:  Posterior tibial pulses are decreased related to the dramatic pedal edema.  The dorsalis pedis pulses are palpable but reduced.  As noted, she is exquisitely tender in the left superomedial calf.  Ambulation was markedly limited and she could not get on the exam table.  NEUROLOGIC:  There are no localizing neurologic signs, although there is slight facial asymmetry of the nasolabial folds.  GENITOURINARY AND BREASTS:  Exams are deferred as they are not germane to this admission.  ASSESSMENT AND PLAN:  She is now admitted because of calf pain in the context of a previous musculoskeletal injury with dramatic edema.  Although she attributes the edema to the nonsteroidal anti-inflammatory agent, she has not taken the medicine for a week, so this would be unlikely.  Other issues include the congenital deafness and minimal hypertension.  Lovenox will be initiated pending the results of the CT of the legs or venous Doppler.  She is a patient at [facility].  I was serving as a Teacher, music the night of admission.  Because of her deafness and the fact that her granddaughter was not able to accompany her to the emergency room, I admitted her to my service.  At discharge, she will return to the care of  the [facility] physicians.  I believe that simply sending this lady to the emergency room would delay treatment for potentially health- or life-threatening condition.  Sodium restriction and diuretics will be prescribed for the edema.  Renal function will be monitored. Dictated by:   Titus Dubin. Alwyn Ren, M.D. [facility] Attending Physician:  Dolores Patty DD:  05/15/01 TD:  05/16/01 Job: [id number]

## 2010-05-20 NOTE — Discharge Summary (Signed)
   NAME:  Sharon Ramos, Sharon Ramos                      ACCOUNT NO.:  [id number]   MEDICAL RECORD NO.:  [id number]                   PATIENT TYPE:  INP   LOCATION:  0363                                 FACILITY:  [facility]   PHYSICIAN:  Lilyan Punt. Sydnee Levans, M.D.             DATE OF BIRTH:  06/13/1940   DATE OF ADMISSION:  06/28/2002  DATE OF DISCHARGE:                                 DISCHARGE SUMMARY   ADDENDUM:   PERTINENT LABORATORIES:  On 07/01/2002 CBC revealed a hemoglobin of 10.7,  hematocrit 31.3, white blood cell count 10.2.  Iron studies: B12 greater  than 1000, folate 8.  Complement C3 normal at 121.  ANA pending.  E. coli  sensitivities pan-sensitive including to ciprofloxacin.  ESR 88.  Total CK 9  (low).  BMET 07/02/2002: Sodium 136, potassium 3.8, chloride 112, CO2 19,  BUN 51, creatinine 2.8, glucose 97.                                               Lilyan Punt Sydnee Levans, M.D.    KCS/MEDQ  D:  07/02/2002  T:  07/02/2002  Job:  [id number]

## 2010-05-20 NOTE — Discharge Summary (Signed)
NAMEMARRION, Sharon Ramos            ACCOUNT NO.:  [id number]   MEDICAL RECORD NO.:  [id number]          PATIENT TYPE:  INP   LOCATION:  5041                         FACILITY:  [facility]   PHYSICIAN:  Myrtie Neither, MD      DATE OF BIRTH:  04-19-1940   DATE OF ADMISSION:  10/10/2007  DATE OF DISCHARGE:  10/14/2007                               DISCHARGE SUMMARY   ADMITTING DIAGNOSES:  Degenerative arthropathy, left knee.  Deaf  patient.   DISCHARGE DIAGNOSES:  Degenerative arthropathy, left knee.  Deaf  patient.   COMPLICATION:  None.   INFECTIONS:  None.   OPERATION:  Left total knee arthroplasty done on October 10, 2006.   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  The  patient had been treated with anti-inflammatories, use of cane, and warm  compresses.  The patient's condition worsened with progressive  disability, pain, giving way, and catching.   PERTINENT PHYSICAL EXAMINATION:  The left knee, genu valgum, crepitus  medial and laterally, +2 effusion, range of motion good but limited,  negative Homans test.   X-rays revealed loss of joint space medial and laterally, sclerosis with  osteophytes.   HOSPITAL COURSE:  The patient underwent preop laboratories; CBC, EKG,  chest x-ray, PT/PTT, platelet count, CMET, UA.  The patient's labs were  found to be stable enough to undergo surgery.  The patient underwent  left total knee arthroplasty, tolerated the procedure quite well.  Postop course was fairly benign.  Started with partial weightbearing on  the left side with the use of walker.  The patient had OT/PT and case  management evaluation.  The patient progressed with pain under control,  partial weightbearing on the left side with the use of walker, Coumadin  therapy, pre and postop prophylactic antibiotics.  The patient  progressed well enough to be discharged home.  Home health and PT  arranged.  Given Percocet 1-2 q. 4 p.r.n. pain, Coumadin daily.  INR  home health nurse and PT.  The patient is to return to the office in 1  week.  The patient was discharged in stable and satisfactory condition.      Myrtie Neither, MD  Electronically Signed     AC/MEDQ  D:  12/04/2007  T:  12/04/2007  Job:  [id number]

## 2010-05-20 NOTE — Discharge Summary (Signed)
[place]. [facility]  Patient:    LIADAN, GUIZAR Visit Number: [id number] MRN: [id number]          Service Type: MED Location: [phone] Attending Physician:  Dolores Patty Dictated by:   Cornell Barman, P.A. Admit Date:  05/14/2001 Discharge Date: 05/17/2001   CC:         Moshe Salisbury. Audria Nine, M.D., [facility]   Discharge Summary  DISCHARGE DIAGNOSES: 1. Right lower extremity swelling. 2. Tenosynovitis. 3. Periosteitis. 4. Hypertension.  BRIEF ADMISSION HISTORY:  The patient is a 70 year old African American deaf female who presented with calf pain and right lower extremity edema.  She describes falling on April 24, 2001.  At that time, she was placed on Naprosyn.  She did go to [facility] for plain films.  These revealed no fracture but apparent periosteal elevation associated with medial malleolus.  There was a question of a neoplastic process.  The patient was admitted for further evaluation.  PAST MEDICAL HISTORY:  This is unremarkable except for congenital deafness.  HOSPITAL COURSE:  #1 - RIGHT LOWER EXTREMITY EDEMA:  The patient was admitted to rule out DVT. She was empirically started on Lovenox.  Lower extremity venous Dopplers were negative for DVT.  Lovenox was decreased to DVT prophylactic dose.  The Coumadin was not initiated.  We did proceed with an MRI of the right lower extremity as she did have a question of a neoplastic process.  The MRI revealed extensive tenosynovitis of the medial aspect of the right ankle with periosteitis of the medial malleolus. There was a question of infection; however, the patient has remained afebrile. The patients white count is normal and the swelling has improved. There was also no significant heat or warmth or erythema associated with the swelling.  More likely, this is all inflammatory secondary to her trauma.  #2 -  HYPERTENSION:  This was borderline  controlled. She is not currently on any medication and will defer antihypertensive initiation to her primary care physician.  LABS AT DISCHARGE:  BMET was normal.  Coags were normal.  Hemoglobin was 10.7.  MEDICATIONS AT DISCHARGE:  None.  FOLLOW-UP:  The patient will follow up with Moshe Salisbury. Audria Nine, M.D., at [facility] as needed. Dictated by:   Cornell Barman, P.A. Attending Physician:  Dolores Patty DD:  05/17/01 TD:  05/20/01 Job: [id number]

## 2010-05-21 LAB — BASIC METABOLIC PANEL
BUN: 22 mg/dL (ref 6–23)
Creatinine, Ser: 1.34 mg/dL — ABNORMAL HIGH (ref 0.4–1.2)
GFR calc non Af Amer: 39 mL/min — ABNORMAL LOW (ref >60)
Glucose, Bld: 92 mg/dL (ref 70–99)
Potassium: 3.6 mEq/L (ref 3.5–5.1)

## 2010-05-21 LAB — TYPE AND SCREEN: Unit division: 0

## 2010-05-23 NOTE — Op Note (Signed)
  NAME:  Sharon Ramos, Sharon Ramos            ACCOUNT NO.:  [id number]  MEDICAL RECORD NO.:  [id number]           PATIENT TYPE:  LOCATION:                                 FACILITY:  PHYSICIAN:  Myrtie Neither, MD      DATE OF BIRTH:  [birth year]  DATE OF PROCEDURE:  05/22/2010 DATE OF DISCHARGE:                              OPERATIVE REPORT   PREOPERATIVE DIAGNOSIS:  Hemarthrosis, left total knee.  POSTOPERATIVE DIAGNOSIS:  Hemarthrosis, left total knee.  ANESTHESIA:  General.  PROCEDURE:  Incision and drainage and placement of Hemovac drain, and coagulation of bleeders, cultures and sensitivities were also done.  SURGEON:  Myrtie Neither, MD  The patient was taken to the operating room after given adequate preop medications, given general anesthesia and intubated.  Left lower extremity was scrubbed with Betadine scrub and painted with Betadine solution and draped in sterile manner.  Incision was made through previous old scar going through the skin and subcutaneous tissue.  Large hematoma was felt underneath the subcutaneous tissue outside the capsule.  This was completely evacuated and irrigated and debrided area. A medial paramedian incision was made through the previous scar into the capsule and hematoma was also found intracapsular.  The volume of bleeding was found to be greater in the subcutaneous.  Copious and abundant irrigation was done.  Simpulse irrigator was used after evacuation of the hematoma and cultures for sensitivity and Gram stain were done.  Hemostasis obtained with use of Bovie.  A large amount of the subcutaneous tissue had been separated from the fascia, and the implant itself looked intact.  No purulent material, no sign of any infection.  Capsule was reapproximated with 0 Vicryl.  Separate subcutaneous sutures were used #2 Vicryl to reapproximate the subcutaneous tissue back down to the fascia.  After this was done, Hemovac drain was placed into the wound, 2-0  Vicryl was used for the subcutaneous again, followed by skin staples for the skin.  Bulky compressive dressing was applied.  The patient tolerated the procedure quite well and went to recovery room in stable and satisfactory condition.     Myrtie Neither, MD     AC/MEDQ  D:  05/22/2010  T:  05/23/2010  Job:  [id number]  Electronically Signed by Myrtie Neither MD on 05/23/2010 03:12:21 PM

## 2010-05-24 DIAGNOSIS — T8450XA Infection and inflammatory reaction due to unspecified internal joint prosthesis, initial encounter: Secondary | ICD-10-CM

## 2010-05-24 LAB — BASIC METABOLIC PANEL
BUN: 21 mg/dL (ref 6–23)
CO2: 27 mEq/L (ref 19–32)
Chloride: 101 mEq/L (ref 96–112)
Creatinine, Ser: 2.01 mg/dL — ABNORMAL HIGH (ref 0.4–1.2)

## 2010-05-24 LAB — CBC
Hemoglobin: 8.3 g/dL — ABNORMAL LOW (ref 12.0–15.0)
MCH: 29.6 pg (ref 26.0–34.0)
MCV: 93.2 fL (ref 78.0–100.0)
RBC: 2.8 MIL/uL — ABNORMAL LOW (ref 3.87–5.11)

## 2010-05-25 LAB — WOUND CULTURE: Culture: NO GROWTH

## 2010-05-25 LAB — CROSSMATCH: Unit division: 0

## 2010-05-25 LAB — HEMOGLOBIN AND HEMATOCRIT, BLOOD: Hemoglobin: 10.1 g/dL — ABNORMAL LOW (ref 12.0–15.0)

## 2010-05-27 LAB — ANAEROBIC CULTURE

## 2010-05-27 NOTE — Consult Note (Signed)
NAME:  Sharon Ramos, Sharon Ramos NO.:  [id number]  MEDICAL RECORD NO.:  [id number]           PATIENT TYPE:  LOCATION:                                 FACILITY:  PHYSICIAN:  Gardiner Barefoot, MD    DATE OF BIRTH:  07/21/1940  DATE OF CONSULTATION: DATE OF DISCHARGE:                                CONSULTATION   CONSULTING PHYSICIAN:  Myrtie Neither, MD.  REASON FOR CONSULTATION:  Infection.  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 Montez Morita, with hemarthrosis earlier this month.  The patient was taken to the operating room and had debridement of a large hematoma of the knee, though it did not show any purulence.  At that time, cultures were sent and the patient was started on antibiotics but cultures did remain negative. The patient was then brought back to the operating room on May 21 for drain placement and cultures again sent.  All cultures, however, from this month have remained negative with only a very few neutrophils and some mononuclear cells.  Initial cultures were done prior to antibiotics.  The patient also approximately 1 year ago had similar episode of hemarthrosis and at that time did have cultures that grew both Pseudomonas that was pansensitive and Enterobacter cloacae, resistant only to cefazolin, trimethoprim, and cefoxitin.  The patient at that time had been on vancomycin.  Between that to that time it does appear that the patient otherwise had done relatively well.  The patient through writing on paper does report that she has had no fever or chills or any other associated problems.  She did state she has head pain though that has improved.  Otherwise history is unremarkable from the patient.  PAST MEDICAL HISTORY: 1. Bilateral TKA. 2. Hypertension. 3. History of hemarthrosis.  MEDICATIONS: 1.  Vancomycin. 2. Rifampin 300 mg b.i.d.  ALLERGIES:  No known drug allergies.  SOCIAL HISTORY:  The patient does deny alcohol, tobacco, or drug use.  FAMILY HISTORY:  The patient does indicate that there is arthritis in family members.  REVIEW OF SYSTEMS:  A 12-point review of systems was obtained and is negative except as per the history of present illness.  PHYSICAL EXAMINATION:  VITAL SIGNS:  Temperature is 98.3 and the patient has remained afebrile during the hospitalization, pulse is 83, respirations 16, blood pressure is 134/50. GENERAL:  The patient is awake, alert, and appears in no acute distress. CARDIOVASCULAR:  Regular rate and rhythm with no murmurs, rubs, or gallops. LUNGS:  Clear to auscultation bilaterally. ABDOMEN:  Soft, nontender, nondistended with positive bowel sounds and no hepatosplenomegaly. EXTREMITIES:  His right knee is wrapped.  LABORATORY DATA:  Show negative cultures from the fluid, a vancomycin trough is 27, creatinine 2.1 with the baseline appears closer to approximately 1.4, WBC is 4.3, CRP is 2.4, and ESR is 30.  Recent bone scan does show loosening of the right knee prosthesis.  ASSESSMENT:  A 70 year old female with hemarthrosis and concern for infection.  Hemarthroses.  At this time cultures  are negative and it does not appear, other than the bone scan, there is any particular evidence for infection.  She has had infection, though she is certainly at risk for infection having had the positive cultures in the past.  It does not appear though that there is any concern for infection at the site of the arthroplasty but more superficial.  This is particularly true in light of the hematoma with negative cultures.  The patient is scheduled to be on 2 weeks of vancomycin and I agree, although I would hold on the rifampin as there is no indication with the negative staph cultures to include rifampin.  I would in place of that use ciprofloxacin p.o.  based on her history of Gram negative rods at the site.  If there is any concern that there was more extensive involvement of infection based on perioperative view, she would likely need a longer course of antibiotics and potentially there are more definitive surgical management.     Gardiner Barefoot, MD     RWC/MEDQ  D:  05/24/2010  T:  05/25/2010  Job:  [id number]  Electronically Signed by Staci Righter MD on 05/27/2010 11:31:10 AM

## 2010-06-03 NOTE — Consult Note (Signed)
  NAME:  Sharon Ramos, KINKER            ACCOUNT NO.:  [id number]  MEDICAL RECORD NO.:  [id number]           PATIENT TYPE:  I  LOCATION:  5025                         FACILITY:  [facility]  PHYSICIAN:  Myrtie Neither, MD      DATE OF BIRTH:  Feb 25, 1940  DATE OF CONSULTATION: DATE OF DISCHARGE:  05/25/2010                        DISCHARGE SUMMARY   ADDENDUM  ADMITTING DIAGNOSIS:  Hemarthrosis of right total knee and possible infection.  DISCHARGE DIAGNOSIS:  Hemarthrosis of right total knee and possible infection.  OPERATION:  Arthrotomy with evacuation of hematoma, culture and sensitivity and Gram stain of right total knee on May 23, 2010.  The patient was found to have acute blood loss anemia, treated with 2 units of packed cells.  The patient also had increased in her creatinine level and infectious disease as well as pharmaceutical consultation was done.  Infectious Disease is recommending discontinuing rifampin as well as vancomycin, observation, and 2 weeks' use of Cipro 750 mg b.i.d.  The patient will be discharged with Cipro 750 mg b.i.d.  Discontinue rifampin, discontinue vancomycin, discontinue PICC line.  Home health and physical therapy at home and Percocet 1-2 q.4 p.r.n. for pain and to return to the office in 1 week.  The patient is being discharged in stable and satisfactory condition.     Myrtie Neither, MD     AC/MEDQ  D:  05/25/2010  T:  05/25/2010  Job:  [id number]  Electronically Signed by Myrtie Neither MD on 06/03/2010 02:27:19 PM
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
## 2010-10-03 LAB — CBC
HCT: 27 — ABNORMAL LOW
HCT: 37.9
Hemoglobin: 12.7
MCHC: 33.4
MCHC: 33.9
MCHC: 34
MCV: 96.2
MCV: 96.8
MCV: 97.2
Platelets: 103 — ABNORMAL LOW
RBC: 2.64 — ABNORMAL LOW
RBC: 3.9
RDW: 14.1
WBC: 2.9 — ABNORMAL LOW
WBC: 5.1

## 2010-10-03 LAB — COMPREHENSIVE METABOLIC PANEL
AST: 23
BUN: 42 — ABNORMAL HIGH
CO2: 28
Calcium: 9.3
Chloride: 104
Creatinine, Ser: 1.69 — ABNORMAL HIGH
GFR calc non Af Amer: 30 — ABNORMAL LOW
Glucose, Bld: 66 — ABNORMAL LOW
Total Bilirubin: 0.8

## 2010-10-03 LAB — PROTIME-INR
INR: 0.9
INR: 1.7 — ABNORMAL HIGH
Prothrombin Time: 12.7
Prothrombin Time: 14.6
Prothrombin Time: 20.6 — ABNORMAL HIGH

## 2010-10-03 LAB — URINALYSIS, ROUTINE W REFLEX MICROSCOPIC
Hgb urine dipstick: NEGATIVE
Nitrite: NEGATIVE
Protein, ur: NEGATIVE
Specific Gravity, Urine: 1.016
Urobilinogen, UA: 1

## 2010-10-03 LAB — APTT: aPTT: 23 — ABNORMAL LOW

## 2010-10-03 LAB — TYPE AND SCREEN: ABO/RH(D): O POS

## 2010-12-13 ENCOUNTER — Ambulatory Visit
Admission: RE | Admit: 2010-12-13 | Discharge: 2010-12-13 | Disposition: A | Discharge status: Home / Self Care | Payer: Medicare Other | Source: Ambulatory Visit | Attending: Orthopedic Surgery | Admitting: Orthopedic Surgery

## 2010-12-13 ENCOUNTER — Other Ambulatory Visit: Payer: Self-pay | Admitting: Orthopedic Surgery

## 2010-12-13 DIAGNOSIS — R52 Pain, unspecified: Secondary | ICD-10-CM

## 2012-12-12 ENCOUNTER — Encounter (HOSPITAL_COMMUNITY): Payer: Self-pay | Admitting: Emergency Medicine

## 2012-12-12 ENCOUNTER — Emergency Department (HOSPITAL_COMMUNITY)
Admission: EM | Admit: 2012-12-12 | Discharge: 2012-12-12 | Disposition: A | Discharge status: Home / Self Care | Payer: Medicare Other | Attending: Emergency Medicine | Admitting: Emergency Medicine

## 2012-12-12 ENCOUNTER — Emergency Department (HOSPITAL_COMMUNITY): Payer: Medicare Other

## 2012-12-12 DIAGNOSIS — M25461 Effusion, right knee: Secondary | ICD-10-CM

## 2012-12-12 DIAGNOSIS — M25469 Effusion, unspecified knee: Secondary | ICD-10-CM | POA: Insufficient documentation

## 2012-12-12 DIAGNOSIS — M25569 Pain in unspecified knee: Secondary | ICD-10-CM | POA: Insufficient documentation

## 2012-12-12 DIAGNOSIS — I10 Essential (primary) hypertension: Secondary | ICD-10-CM | POA: Insufficient documentation

## 2012-12-12 DIAGNOSIS — Z9889 Other specified postprocedural states: Secondary | ICD-10-CM | POA: Insufficient documentation

## 2012-12-12 DIAGNOSIS — M25561 Pain in right knee: Secondary | ICD-10-CM

## 2012-12-12 HISTORY — DX: Essential (primary) hypertension: I10

## 2012-12-12 MED ORDER — OXYCODONE-ACETAMINOPHEN 5-325 MG PO TABS
1.0000 | ORAL_TABLET | Freq: Once | ORAL | Status: AC
  Administered 2012-12-12: 1 via ORAL
  Filled 2012-12-12: qty 1

## 2012-12-12 MED ORDER — OXYCODONE-ACETAMINOPHEN 5-325 MG PO TABS: 1.0000 | ORAL_TABLET | Freq: Four times a day (QID) | ORAL | Status: DC | PRN

## 2012-12-12 NOTE — ED Notes (Addendum)
Per EMS, Pt. Has pain and swelling in R knee since Thanksgiving. Pt came in today because she could no longer walk.  Pt. Has hx of multiple R Knee replacements.  A&Ox4. NAD noted. Pt. Is deaf.

## 2012-12-12 NOTE — ED Provider Notes (Signed)
72 year old female with a history of bilateral total knee arthroplasty performed over the last several years presents with a complaint of right knee pain. The patient and her family member are unclear on the exact timing of the onset of her symptoms including swelling and pain stating that it may have been months since it started, however she feels like the pain is increased recently. They deny redness or fevers.  On my exam the patient is obese, does not appear to be in distress, has scarring consistent with a bilateral total knee arthroplasty, has swelling of the right knee compared to the left knee but no redness warmth or fullness. She is able to straight leg raise on the right but has decreased range of motion of the knee secondary to pain and swelling.  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 Much less likely to be gout, very unlikely to be a septic arthritis . The patient's orthopedist is Dr. Montez Morita.  The patient had some difficulty ambulating, arthrocentesis was performed by myself to further decompress the patient's knee with successful removal of approximately 50 cc of bloody fluid. Bandage applied, Ace wrap applied, patient discharged home with family, will attempt to arrange home health care.  ARTHOCENTESIS Performed by: Vida Roller Consent: Verbal consent obtained. Risks and benefits: risks, benefits and alternatives were discussed Consent given by: patient Required items: required blood products, implants, devices, and special equipment available Patient identity confirmed: verbally with patient Time out: Immediately prior to procedure a "time out" was called to verify the correct patient, procedure, equipment, support staff and site/side marked as required. Indications: Swollen knee right  Joint: Right knee Local anesthesia used: 1% lidocaine  Preparation: Patient was prepped and  draped in the usual sterile fashion. Aspirate appearance: Bloody  Aspirate amount: 50 ml Patient tolerance: Patient tolerated the procedure well with no immediate complications.      Medical screening examination/treatment/procedure(s) were conducted as a shared visit with non-physician practitioner(s) and myself.  I personally evaluated the patient during the encounter.  Clinical Impression: Hemarthrosis right knee      Vida Roller, MD 12/12/12 [phone]

## 2012-12-12 NOTE — ED Provider Notes (Signed)
Medical screening examination/treatment/procedure(s) were conducted as a shared visit with non-physician practitioner(s) and myself.  I personally evaluated the patient during the encounter  Please see my separate respective documentation pertaining to this patient encounter   Vida Roller, MD 12/12/12 [phone]

## 2012-12-12 NOTE — ED Provider Notes (Signed)
CSN: [id number]     Arrival date & time 12/12/12  0245 History   First MD Initiated Contact with Patient 12/12/12 0251     Chief Complaint  Patient presents with  . Joint Swelling   (Consider location/radiation/quality/duration/timing/severity/associated sxs/prior Treatment) HPI 72 yo female with hx of bilaterally TKA, presents with RIght Knee swelling and pain since just before Thanksgiving. Patient is deaf, Daughter is at bedside and provides a limited hx. Daughter communicates with patient with sign language and relates the message. Daughter states patient's right knee has been swollen like it is today for many years. Patient relates that she had a fall in the past, date is uncertain, but appears to be years prior. Patient has recently been more active since July this year after her husband passed away. Patient now complaining of pain in the Right Knee. Denies radiation. Takes oxycodone for pain which seems to provide some relief. Patient denies fever, chills, new injury. Denies dyspnea, and Chest pain.  Past Medical History  Diagnosis Date  . Hypertension    Past Surgical History  Procedure Laterality Date  . Knee surgery     No family history on file. History  Substance Use Topics  . Smoking status: Never Smoker   . Smokeless tobacco: Not on file  . Alcohol Use: 12.6 oz/week    21 Cans of beer per week   OB History   Grav Para Term Preterm Abortions TAB SAB Ect Mult Living                 Review of Systems  Cardiovascular: Negative for leg swelling.  Musculoskeletal: Positive for gait problem (Difficulty bearing weight) and joint swelling (RIGHT KNEE).  All other systems reviewed and are negative.    Allergies  Review of patient's allergies indicates no known allergies.  Home Medications   Current Outpatient Rx  Name  Route  Sig  Dispense  Refill  . oxyCODONE-acetaminophen (PERCOCET/ROXICET) 5-325 MG per tablet   Oral   Take 1 tablet by mouth every 6 (six) hours  as needed for moderate pain or severe pain.   20 tablet   0    BP 129/61  Pulse 84  Temp(Src) 98.4 F (36.9 C) (Oral)  Resp 16  SpO2 98% Physical Exam  Nursing note and vitals reviewed. Constitutional: She appears well-developed and well-nourished. No distress.  HENT:  Head: Normocephalic and atraumatic.  Cardiovascular: Normal rate and regular rhythm.  Exam reveals no gallop and no friction rub.   No murmur heard. Pulmonary/Chest: Effort normal. No respiratory distress. She has no wheezes. She has rales (Bilateral Bibasilar rales. ).  Musculoskeletal: Normal range of motion. She exhibits no edema.       Right knee: She exhibits swelling and effusion. She exhibits no erythema and no bony tenderness. Tenderness found. Medial joint line tenderness noted.       Left knee: She exhibits no swelling and no effusion. No tenderness found.  Neurological: She is alert.  Skin: Skin is warm and dry. She is not diaphoretic.  Psychiatric: She has a normal mood and affect. Her behavior is normal.    ED Course  Procedures (including critical care time) Labs Review Labs Reviewed - No data to display Imaging Review Dg Knee Complete 4 Views Right  12/12/2012   CLINICAL DATA:  Right knee pain and swelling for several days.  EXAM: RIGHT KNEE - COMPLETE 4+ VIEW  COMPARISON:  Right knee radiographs performed 04/28/2009  FINDINGS: There is no evidence of  fracture or dislocation. The patient's knee arthroplasty is grossly unremarkable in appearance; there is no evidence of loosening. A very large knee joint effusion is noted, with resultant rotation of the patella. There is associated bowing of the quadriceps and patellar tendons.  A osseous fragment posterior to the joint space likely reflects a small degenerative loose body. A fabella is noted. No additional soft tissue abnormalities are seen.  IMPRESSION: 1. No evidence of fracture or dislocation. 2. Very large knee joint effusion noted, with associated  rotation of the patella and bowing of the quadriceps and patellar tendons.   Electronically Signed   By: Roanna Raider M.D.   On: 12/12/2012 04:24    EKG Interpretation   None       MDM   1. Knee effusion, right   2. Knee pain, right    No signs of infection, No discoloration or warmth overlying RIGHT knee. Xray shows joint effusion with no bony abnormalities. Patient not seen since 2012 according to records. Advised follow up with Orthopedic surgeon, Dr. Cathren Harsh, within the next 2 days.  Patient underwent arthrocentesis of RIGHT knee by Dr. Eber Hong. Aspiration of joint space reveals gross blood, approx 50 cc drained. Patient tolerated procedure well. Compression bandage applied.  Patient's daughter concerned for patient's ability to ambulate secondary to pain. Patient has wheelchair and walker at home. Advised use of walker and wheelchair. Recommend avoiding use of cane or crutch to reduce risk of fall. Follow up with ortho ASAP. Orders placed for home health evaluation and ambulatory services. Discussed this plan with the patient. Patient agrees with plan. Daughter expresses plan to stay with patient tonight to ensure she has adequate assistance. Patient discharged home with instructions to return if symptoms should worsen.   Meds given in ED:  Medications  oxyCODONE-acetaminophen (PERCOCET/ROXICET) 5-325 MG per tablet 1 tablet (1 tablet Oral Given 12/12/12 0402)    Discharge Medication List as of 12/12/2012  4:45 AM    START taking these medications   Details  oxyCODONE-acetaminophen (PERCOCET) 5-325 MG per tablet Take 1 tablet by mouth every 6 (six) hours as needed., Starting 12/12/2012, Until Discontinued, Print           Rudene Anda, PA-C 12/12/12 1807

## 2012-12-12 NOTE — ED Notes (Signed)
Bed: WR60 Expected date:  Expected time:  Means of arrival:  Comments: EMS 72yo F , rt knee pain, swelling

## 2012-12-15 ENCOUNTER — Emergency Department (HOSPITAL_COMMUNITY)
Admission: EM | Admit: 2012-12-15 | Discharge: 2012-12-15 | Disposition: A | Discharge status: Home / Self Care | Payer: Medicare Other | Attending: Emergency Medicine | Admitting: Emergency Medicine

## 2012-12-15 ENCOUNTER — Encounter (HOSPITAL_COMMUNITY): Payer: Self-pay | Admitting: Emergency Medicine

## 2012-12-15 DIAGNOSIS — H9190 Unspecified hearing loss, unspecified ear: Secondary | ICD-10-CM | POA: Insufficient documentation

## 2012-12-15 DIAGNOSIS — M2500 Hemarthrosis, unspecified joint: Secondary | ICD-10-CM

## 2012-12-15 DIAGNOSIS — Z79899 Other long term (current) drug therapy: Secondary | ICD-10-CM | POA: Insufficient documentation

## 2012-12-15 DIAGNOSIS — M25069 Hemarthrosis, unspecified knee: Secondary | ICD-10-CM | POA: Insufficient documentation

## 2012-12-15 DIAGNOSIS — I10 Essential (primary) hypertension: Secondary | ICD-10-CM | POA: Insufficient documentation

## 2012-12-15 DIAGNOSIS — Z96659 Presence of unspecified artificial knee joint: Secondary | ICD-10-CM | POA: Insufficient documentation

## 2012-12-15 MED ORDER — LIDOCAINE-EPINEPHRINE 2 %-1:100000 IJ SOLN
20.0000 mL | Freq: Once | INTRAMUSCULAR | Status: AC
  Administered 2012-12-15: 20 mL
  Filled 2012-12-15: qty 1

## 2012-12-15 MED ORDER — OXYCODONE-ACETAMINOPHEN 5-325 MG PO TABS
1.0000 | ORAL_TABLET | Freq: Once | ORAL | Status: AC
  Administered 2012-12-15: 1 via ORAL
  Filled 2012-12-15: qty 1

## 2012-12-15 MED ORDER — LIDOCAINE HCL 2 % IJ SOLN: 10.0000 mL | Freq: Once | INTRAMUSCULAR | Status: DC

## 2012-12-15 NOTE — ED Provider Notes (Signed)
CSN: [id number]     Arrival date & time 12/15/12  0139 History   First MD Initiated Contact with Patient 12/15/12 0240     Chief Complaint  Patient presents with  . Knee Pain   (Consider location/radiation/quality/duration/timing/severity/associated sxs/prior Treatment) Patient is a 72 y.o. female presenting with knee pain. The history is provided by the patient. The history is limited by a language barrier (deaf). A language interpreter was used.  Knee Pain Location:  Knee Time since incident:  2 hours (severe pain started 2 hours ago) Injury: no   Knee location:  R knee Pain details:    Quality:  Sharp, throbbing, shooting and pressure   Radiates to:  Does not radiate   Severity:  Severe   Onset quality:  Gradual   Duration: has had intermittent right knee pain and swelling that became abruptly worse 2 hours ago.   Timing:  Constant   Progression:  Unchanged Chronicity:  Recurrent Prior injury to area:  No Relieved by:  None tried Worsened by:  Activity and bearing weight Ineffective treatments: percocet. Associated symptoms: stiffness and swelling   Associated symptoms: no fever and no tingling   Risk factors comment:  Hx of bilateral TKA and seen 2 days ago with aspiration of hemarthrosis   Past Medical History  Diagnosis Date  . Hypertension    Past Surgical History  Procedure Laterality Date  . Knee surgery     History reviewed. No pertinent family history. History  Substance Use Topics  . Smoking status: Never Smoker   . Smokeless tobacco: Not on file  . Alcohol Use: 12.6 oz/week    21 Cans of beer per week   OB History   Grav Para Term Preterm Abortions TAB SAB Ect Mult Living                 Review of Systems  Constitutional: Negative for fever.  Musculoskeletal: Positive for stiffness.  All other systems reviewed and are negative.    Allergies  Review of patient's allergies indicates no known allergies.  Home Medications   Current Outpatient  Rx  Name  Route  Sig  Dispense  Refill  . amLODipine (NORVASC) 5 MG tablet   Oral   Take 5 mg by mouth daily.         Marland Kitchen oxyCODONE-acetaminophen (PERCOCET/ROXICET) 5-325 MG per tablet   Oral   Take 1 tablet by mouth every 6 (six) hours as needed for moderate pain or severe pain.   20 tablet   0    BP 163/72  Pulse 105  Temp(Src) 98.2 F (36.8 C) (Oral)  Resp 20  Ht 5\' 9"  (1.753 m)  Wt 213 lb (96.616 kg)  BMI 31.44 kg/m2  SpO2 100% Physical Exam  Nursing note and vitals reviewed. Constitutional: She is oriented to person, place, and time. She appears well-developed and well-nourished.  Appears uncomfortable  HENT:  Head: Normocephalic and atraumatic.  Eyes: EOM are normal. Pupils are equal, round, and reactive to light.  Cardiovascular: Normal rate and intact distal pulses.   Pulmonary/Chest: Effort normal.  Musculoskeletal:       Right knee: She exhibits decreased range of motion, swelling and effusion. She exhibits no erythema.       Legs: Neurological: She is alert and oriented to person, place, and time.  Skin: Skin is warm and dry.  Psychiatric: She has a normal mood and affect. Her behavior is normal.    ED Course  ARTHOCENTESIS Date/Time: 12/15/2012 6:54  AM Performed by: Gwyneth Sprout Authorized by: Gwyneth Sprout Consent: Verbal consent obtained. Risks and benefits: risks, benefits and alternatives were discussed Consent given by: patient Patient understanding: patient states understanding of the procedure being performed Patient consent: the patient's understanding of the procedure matches consent given Patient identity confirmed: verbally with patient Time out: Immediately prior to procedure a "time out" was called to verify the correct patient, procedure, equipment, support staff and site/side marked as required. Indications: joint swelling and pain  Body area: knee Joint: right knee Local anesthesia used: yes Anesthesia: local  infiltration Local anesthetic: lidocaine 1% with epinephrine Anesthetic total: 7 ml Patient sedated: no Preparation: Patient was prepped and draped in the usual sterile fashion. Needle gauge: 18 G Approach: medial Aspirate: bloody Aspirate amount: 70 ml Patient tolerance: Patient tolerated the procedure well with no immediate complications.   (including critical care time) Labs Review Labs Reviewed - No data to display Imaging Review No results found.  EKG Interpretation   None       MDM   1. Hemarthrosis    Patient presenting due to severe knee pain that started approximately 2 hours ago. Patient had a history of a large hemarthrosis that was drained 2 days ago and was doing okay until this evening. She has been walking with a cane and has been keeping her leg wrapped. However she took the oxycodone without relief. Patient has a prior history of knee replacement with complications in the past. Patient takes no anticoagulation has no signs of vascular compromise or infection at this time. Evidence of significant effusion on exam without any history of trauma however due to patient's weight of 213 and walking on the leg could have caused recurrent hemarthrosis.  Bedside joint aspiration performed with with of blood removed.  Pt instructed she can take 2percocet at a time as needed and recommended using her wheelchair most of the time and elevated the knee and keeping it wrapped.  Pt to see Dr. Montez Morita on Monday.    Gwyneth Sprout, MD 12/15/12 [phone]
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
## 2013-05-21 ENCOUNTER — Ambulatory Visit
Admission: RE | Admit: 2013-05-21 | Discharge: 2013-05-21 | Disposition: A | Discharge status: Home / Self Care | Payer: Medicare Other | Source: Ambulatory Visit | Attending: Orthopedic Surgery | Admitting: Orthopedic Surgery

## 2013-05-21 ENCOUNTER — Other Ambulatory Visit: Payer: Self-pay | Admitting: Orthopedic Surgery

## 2013-05-21 DIAGNOSIS — S92323A Displaced fracture of second metatarsal bone, unspecified foot, initial encounter for closed fracture: Secondary | ICD-10-CM

## 2013-10-08 ENCOUNTER — Encounter (HOSPITAL_COMMUNITY): Payer: Self-pay | Admitting: Emergency Medicine

## 2013-10-08 ENCOUNTER — Emergency Department (HOSPITAL_COMMUNITY): Payer: No Typology Code available for payment source

## 2013-10-08 ENCOUNTER — Emergency Department (HOSPITAL_COMMUNITY)
Admission: EM | Admit: 2013-10-08 | Discharge: 2013-10-08 | Disposition: A | Discharge status: Home / Self Care | Payer: No Typology Code available for payment source | Attending: Emergency Medicine | Admitting: Emergency Medicine

## 2013-10-08 DIAGNOSIS — I10 Essential (primary) hypertension: Secondary | ICD-10-CM | POA: Insufficient documentation

## 2013-10-08 DIAGNOSIS — Y9389 Activity, other specified: Secondary | ICD-10-CM | POA: Diagnosis not present

## 2013-10-08 DIAGNOSIS — T07XXXA Unspecified multiple injuries, initial encounter: Secondary | ICD-10-CM

## 2013-10-08 DIAGNOSIS — S2090XA Unspecified superficial injury of unspecified parts of thorax, initial encounter: Secondary | ICD-10-CM | POA: Insufficient documentation

## 2013-10-08 DIAGNOSIS — S8991XA Unspecified injury of right lower leg, initial encounter: Secondary | ICD-10-CM | POA: Diagnosis not present

## 2013-10-08 DIAGNOSIS — S199XXA Unspecified injury of neck, initial encounter: Secondary | ICD-10-CM | POA: Diagnosis present

## 2013-10-08 DIAGNOSIS — S20219A Contusion of unspecified front wall of thorax, initial encounter: Secondary | ICD-10-CM | POA: Insufficient documentation

## 2013-10-08 DIAGNOSIS — Z9889 Other specified postprocedural states: Secondary | ICD-10-CM | POA: Insufficient documentation

## 2013-10-08 DIAGNOSIS — Y92410 Unspecified street and highway as the place of occurrence of the external cause: Secondary | ICD-10-CM | POA: Diagnosis not present

## 2013-10-08 DIAGNOSIS — S1093XA Contusion of unspecified part of neck, initial encounter: Secondary | ICD-10-CM | POA: Diagnosis not present

## 2013-10-08 LAB — BASIC METABOLIC PANEL
ANION GAP: 11 (ref 5–15)
BUN: 38 mg/dL — ABNORMAL HIGH (ref 6–23)
CALCIUM: 8.9 mg/dL (ref 8.4–10.5)
CO2: 24 mEq/L (ref 19–32)
CREATININE: 1.56 mg/dL — AB (ref 0.50–1.10)
Chloride: 105 mEq/L (ref 96–112)
GFR, EST AFRICAN AMERICAN: 37 mL/min — AB (ref >90)
GFR, EST NON AFRICAN AMERICAN: 32 mL/min — AB (ref >90)
Glucose, Bld: 114 mg/dL — ABNORMAL HIGH (ref 70–99)
Potassium: 5.3 mEq/L (ref 3.7–5.3)
Sodium: 140 mEq/L (ref 137–147)

## 2013-10-08 LAB — I-STAT TROPONIN, ED: Troponin i, poc: 0.01 ng/mL (ref 0.00–0.08)

## 2013-10-08 LAB — CBC
HEMATOCRIT: 36.3 % (ref 36.0–46.0)
Hemoglobin: 12 g/dL (ref 12.0–15.0)
MCH: 29.1 pg (ref 26.0–34.0)
MCHC: 33.1 g/dL (ref 30.0–36.0)
MCV: 87.9 fL (ref 78.0–100.0)
PLATELETS: 141 10*3/uL — AB (ref 150–400)
RBC: 4.13 MIL/uL (ref 3.87–5.11)
RDW: 15.7 % — AB (ref 11.5–15.5)
WBC: 3.2 10*3/uL — AB (ref 4.0–10.5)

## 2013-10-08 LAB — PROTIME-INR
INR: 0.99 (ref 0.00–1.49)
Prothrombin Time: 13.1 seconds (ref 11.6–15.2)

## 2013-10-08 LAB — TROPONIN I: Troponin I: <0.3 ng/mL (ref <0.30)

## 2013-10-08 MED ORDER — FENTANYL CITRATE 0.05 MG/ML IJ SOLN
50.0000 ug | Freq: Once | INTRAMUSCULAR | Status: AC
  Administered 2013-10-08: 50 ug via INTRAVENOUS
  Filled 2013-10-08: qty 2

## 2013-10-08 MED ORDER — ONDANSETRON HCL 4 MG/2ML IJ SOLN
4.0000 mg | Freq: Once | INTRAMUSCULAR | Status: AC
  Administered 2013-10-08: 4 mg via INTRAVENOUS
  Filled 2013-10-08: qty 2
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
## 2013-10-08 NOTE — ED Notes (Signed)
Dr. Rancour at bedside. 

## 2013-10-08 NOTE — ED Notes (Signed)
Pt ambulated in hallway with cane. 

## 2013-10-08 NOTE — Discharge Instructions (Signed)
Contusion Your x-rays and CT scans are negative for serious traumatic injury. Followup with your doctor. Take your pain medication as prescribed. Return to the ED if you develop new or worsening symptoms. A contusion is a deep bruise. Contusions are the result of an injury that caused bleeding under the skin. The contusion may turn blue, purple, or yellow. Minor injuries will give you a painless contusion, but more severe contusions may stay painful and swollen for a few weeks.  CAUSES  A contusion is usually caused by a blow, trauma, or direct force to an area of the body. SYMPTOMS   Swelling and redness of the injured area.  Bruising of the injured area.  Tenderness and soreness of the injured area.  Pain. DIAGNOSIS  The diagnosis can be made by taking a history and physical exam. An X-ray, CT scan, or MRI may be needed to determine if there were any associated injuries, such as fractures. TREATMENT  Specific treatment will depend on what area of the body was injured. In general, the best treatment for a contusion is resting, icing, elevating, and applying cold compresses to the injured area. Over-the-counter medicines may also be recommended for pain control. Ask your caregiver what the best treatment is for your contusion. HOME CARE INSTRUCTIONS   Put ice on the injured area.  Put ice in a plastic bag.  Place a towel between your skin and the bag.  Leave the ice on for 15-20 minutes, 3-4 times a day, or as directed by your health care provider.  Only take over-the-counter or prescription medicines for pain, discomfort, or fever as directed by your caregiver. Your caregiver may recommend avoiding anti-inflammatory medicines (aspirin, ibuprofen, and naproxen) for 48 hours because these medicines may increase bruising.  Rest the injured area.  If possible, elevate the injured area to reduce swelling. SEEK IMMEDIATE MEDICAL CARE IF:   You have increased bruising or swelling.  You  have pain that is getting worse.  Your swelling or pain is not relieved with medicines. MAKE SURE YOU:   Understand these instructions.  Will watch your condition.  Will get help right away if you are not doing well or get worse. Document Released: 09/28/2004 Document Revised: 12/24/2012 Document Reviewed: 10/24/2010 [facility] Patient Information 2015 [place], [place]. This information is not intended to replace advice given to you by your health care provider. Make sure you discuss any questions you have with your health care provider.

## 2013-11-20 ENCOUNTER — Ambulatory Visit
Admission: RE | Admit: 2013-11-20 | Discharge: 2013-11-20 | Disposition: A | Discharge status: Home / Self Care | Payer: Medicare Other | Source: Ambulatory Visit | Attending: Orthopedic Surgery | Admitting: Orthopedic Surgery

## 2013-11-20 ENCOUNTER — Other Ambulatory Visit: Payer: Self-pay | Admitting: Orthopedic Surgery

## 2013-11-20 DIAGNOSIS — S161XXA Strain of muscle, fascia and tendon at neck level, initial encounter: Secondary | ICD-10-CM

## 2014-03-11 DIAGNOSIS — H04123 Dry eye syndrome of bilateral lacrimal glands: Secondary | ICD-10-CM | POA: Diagnosis not present

## 2014-03-11 DIAGNOSIS — H10413 Chronic giant papillary conjunctivitis, bilateral: Secondary | ICD-10-CM | POA: Diagnosis not present

## 2014-03-11 DIAGNOSIS — H2513 Age-related nuclear cataract, bilateral: Secondary | ICD-10-CM | POA: Diagnosis not present

## 2014-03-11 DIAGNOSIS — H40013 Open angle with borderline findings, low risk, bilateral: Secondary | ICD-10-CM | POA: Diagnosis not present

## 2014-09-16 DIAGNOSIS — G5600 Carpal tunnel syndrome, unspecified upper limb: Secondary | ICD-10-CM | POA: Diagnosis not present

## 2014-10-29 DIAGNOSIS — N189 Chronic kidney disease, unspecified: Secondary | ICD-10-CM | POA: Diagnosis not present

## 2014-10-29 DIAGNOSIS — R7309 Other abnormal glucose: Secondary | ICD-10-CM | POA: Diagnosis not present

## 2014-10-29 DIAGNOSIS — M1A3391 Chronic gout due to renal impairment, unspecified wrist, with tophus (tophi): Secondary | ICD-10-CM | POA: Diagnosis not present

## 2014-10-29 DIAGNOSIS — I10 Essential (primary) hypertension: Secondary | ICD-10-CM | POA: Diagnosis not present

## 2014-10-29 DIAGNOSIS — N3281 Overactive bladder: Secondary | ICD-10-CM | POA: Diagnosis not present

## 2014-10-29 DIAGNOSIS — Z23 Encounter for immunization: Secondary | ICD-10-CM | POA: Diagnosis not present

## 2014-10-30 DIAGNOSIS — M10042 Idiopathic gout, left hand: Secondary | ICD-10-CM | POA: Diagnosis not present

## 2014-11-04 DIAGNOSIS — D638 Anemia in other chronic diseases classified elsewhere: Secondary | ICD-10-CM | POA: Diagnosis not present

## 2014-11-04 DIAGNOSIS — N2581 Secondary hyperparathyroidism of renal origin: Secondary | ICD-10-CM | POA: Diagnosis not present

## 2014-11-04 DIAGNOSIS — I129 Hypertensive chronic kidney disease with stage 1 through stage 4 chronic kidney disease, or unspecified chronic kidney disease: Secondary | ICD-10-CM | POA: Diagnosis not present

## 2014-11-04 DIAGNOSIS — R32 Unspecified urinary incontinence: Secondary | ICD-10-CM | POA: Diagnosis not present

## 2014-11-04 DIAGNOSIS — N183 Chronic kidney disease, stage 3 (moderate): Secondary | ICD-10-CM | POA: Diagnosis not present

## 2014-11-19 DIAGNOSIS — N189 Chronic kidney disease, unspecified: Secondary | ICD-10-CM | POA: Diagnosis not present

## 2014-11-19 DIAGNOSIS — Z6841 Body Mass Index (BMI) 40.0 and over, adult: Secondary | ICD-10-CM | POA: Diagnosis not present

## 2014-11-19 DIAGNOSIS — M1000 Idiopathic gout, unspecified site: Secondary | ICD-10-CM | POA: Diagnosis not present

## 2014-11-19 DIAGNOSIS — Z0000 Encounter for general adult medical examination without abnormal findings: Secondary | ICD-10-CM | POA: Diagnosis not present

## 2014-11-19 DIAGNOSIS — I10 Essential (primary) hypertension: Secondary | ICD-10-CM | POA: Diagnosis not present

## 2015-02-17 DIAGNOSIS — R600 Localized edema: Secondary | ICD-10-CM | POA: Diagnosis not present

## 2015-02-17 DIAGNOSIS — M1000 Idiopathic gout, unspecified site: Secondary | ICD-10-CM | POA: Diagnosis not present

## 2015-02-17 DIAGNOSIS — I10 Essential (primary) hypertension: Secondary | ICD-10-CM | POA: Diagnosis not present

## 2015-02-17 DIAGNOSIS — M1A3391 Chronic gout due to renal impairment, unspecified wrist, with tophus (tophi): Secondary | ICD-10-CM | POA: Diagnosis not present

## 2015-02-17 DIAGNOSIS — N189 Chronic kidney disease, unspecified: Secondary | ICD-10-CM | POA: Diagnosis not present

## 2015-03-03 DIAGNOSIS — Z0000 Encounter for general adult medical examination without abnormal findings: Secondary | ICD-10-CM | POA: Diagnosis not present

## 2015-03-03 DIAGNOSIS — I10 Essential (primary) hypertension: Secondary | ICD-10-CM | POA: Diagnosis not present

## 2015-03-03 DIAGNOSIS — M1A3391 Chronic gout due to renal impairment, unspecified wrist, with tophus (tophi): Secondary | ICD-10-CM | POA: Diagnosis not present

## 2015-03-03 DIAGNOSIS — N189 Chronic kidney disease, unspecified: Secondary | ICD-10-CM | POA: Diagnosis not present

## 2015-06-07 DIAGNOSIS — I10 Essential (primary) hypertension: Secondary | ICD-10-CM | POA: Diagnosis not present

## 2015-06-07 DIAGNOSIS — N189 Chronic kidney disease, unspecified: Secondary | ICD-10-CM | POA: Diagnosis not present

## 2015-06-07 DIAGNOSIS — N3281 Overactive bladder: Secondary | ICD-10-CM | POA: Diagnosis not present

## 2015-06-07 DIAGNOSIS — M1A3391 Chronic gout due to renal impairment, unspecified wrist, with tophus (tophi): Secondary | ICD-10-CM | POA: Diagnosis not present

## 2015-06-10 DIAGNOSIS — Z79891 Long term (current) use of opiate analgesic: Secondary | ICD-10-CM | POA: Diagnosis not present

## 2015-06-10 DIAGNOSIS — Z9181 History of falling: Secondary | ICD-10-CM | POA: Diagnosis not present

## 2015-06-10 DIAGNOSIS — N3281 Overactive bladder: Secondary | ICD-10-CM | POA: Diagnosis not present

## 2015-06-10 DIAGNOSIS — R600 Localized edema: Secondary | ICD-10-CM | POA: Diagnosis not present

## 2015-06-10 DIAGNOSIS — I10 Essential (primary) hypertension: Secondary | ICD-10-CM | POA: Diagnosis not present

## 2015-06-10 DIAGNOSIS — Z792 Long term (current) use of antibiotics: Secondary | ICD-10-CM | POA: Diagnosis not present

## 2015-06-10 DIAGNOSIS — H9193 Unspecified hearing loss, bilateral: Secondary | ICD-10-CM | POA: Diagnosis not present

## 2015-06-10 DIAGNOSIS — Z96653 Presence of artificial knee joint, bilateral: Secondary | ICD-10-CM | POA: Diagnosis not present

## 2015-06-10 DIAGNOSIS — M109 Gout, unspecified: Secondary | ICD-10-CM | POA: Diagnosis not present

## 2015-06-10 DIAGNOSIS — M170 Bilateral primary osteoarthritis of knee: Secondary | ICD-10-CM | POA: Diagnosis not present

## 2015-06-15 DIAGNOSIS — Z96653 Presence of artificial knee joint, bilateral: Secondary | ICD-10-CM | POA: Diagnosis not present

## 2015-06-15 DIAGNOSIS — Z792 Long term (current) use of antibiotics: Secondary | ICD-10-CM | POA: Diagnosis not present

## 2015-06-15 DIAGNOSIS — N3281 Overactive bladder: Secondary | ICD-10-CM | POA: Diagnosis not present

## 2015-06-15 DIAGNOSIS — R600 Localized edema: Secondary | ICD-10-CM | POA: Diagnosis not present

## 2015-06-15 DIAGNOSIS — H9193 Unspecified hearing loss, bilateral: Secondary | ICD-10-CM | POA: Diagnosis not present

## 2015-06-15 DIAGNOSIS — Z79891 Long term (current) use of opiate analgesic: Secondary | ICD-10-CM | POA: Diagnosis not present

## 2015-06-15 DIAGNOSIS — Z9181 History of falling: Secondary | ICD-10-CM | POA: Diagnosis not present

## 2015-06-15 DIAGNOSIS — I10 Essential (primary) hypertension: Secondary | ICD-10-CM | POA: Diagnosis not present

## 2015-06-15 DIAGNOSIS — M170 Bilateral primary osteoarthritis of knee: Secondary | ICD-10-CM | POA: Diagnosis not present

## 2015-06-15 DIAGNOSIS — M109 Gout, unspecified: Secondary | ICD-10-CM | POA: Diagnosis not present

## 2015-06-17 DIAGNOSIS — Z96653 Presence of artificial knee joint, bilateral: Secondary | ICD-10-CM | POA: Diagnosis not present

## 2015-06-17 DIAGNOSIS — I10 Essential (primary) hypertension: Secondary | ICD-10-CM | POA: Diagnosis not present

## 2015-06-17 DIAGNOSIS — R600 Localized edema: Secondary | ICD-10-CM | POA: Diagnosis not present

## 2015-06-17 DIAGNOSIS — H9193 Unspecified hearing loss, bilateral: Secondary | ICD-10-CM | POA: Diagnosis not present

## 2015-06-17 DIAGNOSIS — Z79891 Long term (current) use of opiate analgesic: Secondary | ICD-10-CM | POA: Diagnosis not present

## 2015-06-17 DIAGNOSIS — Z9181 History of falling: Secondary | ICD-10-CM | POA: Diagnosis not present

## 2015-06-17 DIAGNOSIS — M109 Gout, unspecified: Secondary | ICD-10-CM | POA: Diagnosis not present

## 2015-06-17 DIAGNOSIS — M170 Bilateral primary osteoarthritis of knee: Secondary | ICD-10-CM | POA: Diagnosis not present

## 2015-06-17 DIAGNOSIS — N3281 Overactive bladder: Secondary | ICD-10-CM | POA: Diagnosis not present

## 2015-06-17 DIAGNOSIS — Z792 Long term (current) use of antibiotics: Secondary | ICD-10-CM | POA: Diagnosis not present

## 2015-06-21 DIAGNOSIS — Z9181 History of falling: Secondary | ICD-10-CM | POA: Diagnosis not present

## 2015-06-21 DIAGNOSIS — Z792 Long term (current) use of antibiotics: Secondary | ICD-10-CM | POA: Diagnosis not present

## 2015-06-21 DIAGNOSIS — Z96653 Presence of artificial knee joint, bilateral: Secondary | ICD-10-CM | POA: Diagnosis not present

## 2015-06-21 DIAGNOSIS — M109 Gout, unspecified: Secondary | ICD-10-CM | POA: Diagnosis not present

## 2015-06-21 DIAGNOSIS — I10 Essential (primary) hypertension: Secondary | ICD-10-CM | POA: Diagnosis not present

## 2015-06-21 DIAGNOSIS — R600 Localized edema: Secondary | ICD-10-CM | POA: Diagnosis not present

## 2015-06-21 DIAGNOSIS — M170 Bilateral primary osteoarthritis of knee: Secondary | ICD-10-CM | POA: Diagnosis not present

## 2015-06-21 DIAGNOSIS — H9193 Unspecified hearing loss, bilateral: Secondary | ICD-10-CM | POA: Diagnosis not present

## 2015-06-21 DIAGNOSIS — N3281 Overactive bladder: Secondary | ICD-10-CM | POA: Diagnosis not present

## 2015-06-21 DIAGNOSIS — Z79891 Long term (current) use of opiate analgesic: Secondary | ICD-10-CM | POA: Diagnosis not present

## 2015-06-23 DIAGNOSIS — M109 Gout, unspecified: Secondary | ICD-10-CM | POA: Diagnosis not present

## 2015-06-23 DIAGNOSIS — H9193 Unspecified hearing loss, bilateral: Secondary | ICD-10-CM | POA: Diagnosis not present

## 2015-06-23 DIAGNOSIS — Z792 Long term (current) use of antibiotics: Secondary | ICD-10-CM | POA: Diagnosis not present

## 2015-06-23 DIAGNOSIS — N3281 Overactive bladder: Secondary | ICD-10-CM | POA: Diagnosis not present

## 2015-06-23 DIAGNOSIS — Z9181 History of falling: Secondary | ICD-10-CM | POA: Diagnosis not present

## 2015-06-23 DIAGNOSIS — Z79891 Long term (current) use of opiate analgesic: Secondary | ICD-10-CM | POA: Diagnosis not present

## 2015-06-23 DIAGNOSIS — M170 Bilateral primary osteoarthritis of knee: Secondary | ICD-10-CM | POA: Diagnosis not present

## 2015-06-23 DIAGNOSIS — I10 Essential (primary) hypertension: Secondary | ICD-10-CM | POA: Diagnosis not present

## 2015-06-23 DIAGNOSIS — Z96653 Presence of artificial knee joint, bilateral: Secondary | ICD-10-CM | POA: Diagnosis not present

## 2015-06-23 DIAGNOSIS — R600 Localized edema: Secondary | ICD-10-CM | POA: Diagnosis not present

## 2015-06-28 DIAGNOSIS — M109 Gout, unspecified: Secondary | ICD-10-CM | POA: Diagnosis not present

## 2015-06-28 DIAGNOSIS — H9193 Unspecified hearing loss, bilateral: Secondary | ICD-10-CM | POA: Diagnosis not present

## 2015-06-28 DIAGNOSIS — Z79891 Long term (current) use of opiate analgesic: Secondary | ICD-10-CM | POA: Diagnosis not present

## 2015-06-28 DIAGNOSIS — Z9181 History of falling: Secondary | ICD-10-CM | POA: Diagnosis not present

## 2015-06-28 DIAGNOSIS — R600 Localized edema: Secondary | ICD-10-CM | POA: Diagnosis not present

## 2015-06-28 DIAGNOSIS — I10 Essential (primary) hypertension: Secondary | ICD-10-CM | POA: Diagnosis not present

## 2015-06-28 DIAGNOSIS — Z792 Long term (current) use of antibiotics: Secondary | ICD-10-CM | POA: Diagnosis not present

## 2015-06-28 DIAGNOSIS — M170 Bilateral primary osteoarthritis of knee: Secondary | ICD-10-CM | POA: Diagnosis not present

## 2015-06-28 DIAGNOSIS — Z96653 Presence of artificial knee joint, bilateral: Secondary | ICD-10-CM | POA: Diagnosis not present

## 2015-06-28 DIAGNOSIS — N3281 Overactive bladder: Secondary | ICD-10-CM | POA: Diagnosis not present

## 2015-06-30 DIAGNOSIS — M109 Gout, unspecified: Secondary | ICD-10-CM | POA: Diagnosis not present

## 2015-06-30 DIAGNOSIS — M170 Bilateral primary osteoarthritis of knee: Secondary | ICD-10-CM | POA: Diagnosis not present

## 2015-06-30 DIAGNOSIS — I10 Essential (primary) hypertension: Secondary | ICD-10-CM | POA: Diagnosis not present

## 2015-06-30 DIAGNOSIS — Z792 Long term (current) use of antibiotics: Secondary | ICD-10-CM | POA: Diagnosis not present

## 2015-06-30 DIAGNOSIS — Z79891 Long term (current) use of opiate analgesic: Secondary | ICD-10-CM | POA: Diagnosis not present

## 2015-06-30 DIAGNOSIS — R600 Localized edema: Secondary | ICD-10-CM | POA: Diagnosis not present

## 2015-06-30 DIAGNOSIS — Z9181 History of falling: Secondary | ICD-10-CM | POA: Diagnosis not present

## 2015-06-30 DIAGNOSIS — N3281 Overactive bladder: Secondary | ICD-10-CM | POA: Diagnosis not present

## 2015-06-30 DIAGNOSIS — Z96653 Presence of artificial knee joint, bilateral: Secondary | ICD-10-CM | POA: Diagnosis not present

## 2015-06-30 DIAGNOSIS — H9193 Unspecified hearing loss, bilateral: Secondary | ICD-10-CM | POA: Diagnosis not present

## 2015-07-05 DIAGNOSIS — R600 Localized edema: Secondary | ICD-10-CM | POA: Diagnosis not present

## 2015-07-05 DIAGNOSIS — Z79891 Long term (current) use of opiate analgesic: Secondary | ICD-10-CM | POA: Diagnosis not present

## 2015-07-05 DIAGNOSIS — H9193 Unspecified hearing loss, bilateral: Secondary | ICD-10-CM | POA: Diagnosis not present

## 2015-07-05 DIAGNOSIS — N3281 Overactive bladder: Secondary | ICD-10-CM | POA: Diagnosis not present

## 2015-07-05 DIAGNOSIS — Z792 Long term (current) use of antibiotics: Secondary | ICD-10-CM | POA: Diagnosis not present

## 2015-07-05 DIAGNOSIS — Z9181 History of falling: Secondary | ICD-10-CM | POA: Diagnosis not present

## 2015-07-05 DIAGNOSIS — M109 Gout, unspecified: Secondary | ICD-10-CM | POA: Diagnosis not present

## 2015-07-05 DIAGNOSIS — Z96653 Presence of artificial knee joint, bilateral: Secondary | ICD-10-CM | POA: Diagnosis not present

## 2015-07-05 DIAGNOSIS — I10 Essential (primary) hypertension: Secondary | ICD-10-CM | POA: Diagnosis not present

## 2015-07-05 DIAGNOSIS — M170 Bilateral primary osteoarthritis of knee: Secondary | ICD-10-CM | POA: Diagnosis not present

## 2015-07-09 DIAGNOSIS — Z792 Long term (current) use of antibiotics: Secondary | ICD-10-CM | POA: Diagnosis not present

## 2015-07-09 DIAGNOSIS — Z9181 History of falling: Secondary | ICD-10-CM | POA: Diagnosis not present

## 2015-07-09 DIAGNOSIS — N3281 Overactive bladder: Secondary | ICD-10-CM | POA: Diagnosis not present

## 2015-07-09 DIAGNOSIS — M109 Gout, unspecified: Secondary | ICD-10-CM | POA: Diagnosis not present

## 2015-07-09 DIAGNOSIS — Z96653 Presence of artificial knee joint, bilateral: Secondary | ICD-10-CM | POA: Diagnosis not present

## 2015-07-09 DIAGNOSIS — R600 Localized edema: Secondary | ICD-10-CM | POA: Diagnosis not present

## 2015-07-09 DIAGNOSIS — I10 Essential (primary) hypertension: Secondary | ICD-10-CM | POA: Diagnosis not present

## 2015-07-09 DIAGNOSIS — M170 Bilateral primary osteoarthritis of knee: Secondary | ICD-10-CM | POA: Diagnosis not present

## 2015-07-09 DIAGNOSIS — H9193 Unspecified hearing loss, bilateral: Secondary | ICD-10-CM | POA: Diagnosis not present

## 2015-07-09 DIAGNOSIS — Z79891 Long term (current) use of opiate analgesic: Secondary | ICD-10-CM | POA: Diagnosis not present

## 2015-07-12 DIAGNOSIS — M109 Gout, unspecified: Secondary | ICD-10-CM | POA: Diagnosis not present

## 2015-07-12 DIAGNOSIS — I10 Essential (primary) hypertension: Secondary | ICD-10-CM | POA: Diagnosis not present

## 2015-07-12 DIAGNOSIS — M170 Bilateral primary osteoarthritis of knee: Secondary | ICD-10-CM | POA: Diagnosis not present

## 2015-07-12 DIAGNOSIS — H9193 Unspecified hearing loss, bilateral: Secondary | ICD-10-CM | POA: Diagnosis not present

## 2015-07-12 DIAGNOSIS — Z79891 Long term (current) use of opiate analgesic: Secondary | ICD-10-CM | POA: Diagnosis not present

## 2015-07-12 DIAGNOSIS — Z9181 History of falling: Secondary | ICD-10-CM | POA: Diagnosis not present

## 2015-07-12 DIAGNOSIS — R600 Localized edema: Secondary | ICD-10-CM | POA: Diagnosis not present

## 2015-07-12 DIAGNOSIS — Z96653 Presence of artificial knee joint, bilateral: Secondary | ICD-10-CM | POA: Diagnosis not present

## 2015-07-12 DIAGNOSIS — Z792 Long term (current) use of antibiotics: Secondary | ICD-10-CM | POA: Diagnosis not present

## 2015-07-12 DIAGNOSIS — N3281 Overactive bladder: Secondary | ICD-10-CM | POA: Diagnosis not present

## 2015-07-13 DIAGNOSIS — M109 Gout, unspecified: Secondary | ICD-10-CM | POA: Diagnosis not present

## 2015-07-13 DIAGNOSIS — M170 Bilateral primary osteoarthritis of knee: Secondary | ICD-10-CM | POA: Diagnosis not present

## 2015-07-13 DIAGNOSIS — Z9181 History of falling: Secondary | ICD-10-CM | POA: Diagnosis not present

## 2015-07-13 DIAGNOSIS — Z79891 Long term (current) use of opiate analgesic: Secondary | ICD-10-CM | POA: Diagnosis not present

## 2015-07-13 DIAGNOSIS — H9193 Unspecified hearing loss, bilateral: Secondary | ICD-10-CM | POA: Diagnosis not present

## 2015-07-13 DIAGNOSIS — Z96653 Presence of artificial knee joint, bilateral: Secondary | ICD-10-CM | POA: Diagnosis not present

## 2015-07-13 DIAGNOSIS — R600 Localized edema: Secondary | ICD-10-CM | POA: Diagnosis not present

## 2015-07-13 DIAGNOSIS — Z792 Long term (current) use of antibiotics: Secondary | ICD-10-CM | POA: Diagnosis not present

## 2015-07-13 DIAGNOSIS — N3281 Overactive bladder: Secondary | ICD-10-CM | POA: Diagnosis not present

## 2015-07-13 DIAGNOSIS — I10 Essential (primary) hypertension: Secondary | ICD-10-CM | POA: Diagnosis not present

## 2015-07-20 DIAGNOSIS — Z79891 Long term (current) use of opiate analgesic: Secondary | ICD-10-CM | POA: Diagnosis not present

## 2015-07-20 DIAGNOSIS — R600 Localized edema: Secondary | ICD-10-CM | POA: Diagnosis not present

## 2015-07-20 DIAGNOSIS — M170 Bilateral primary osteoarthritis of knee: Secondary | ICD-10-CM | POA: Diagnosis not present

## 2015-07-20 DIAGNOSIS — N3281 Overactive bladder: Secondary | ICD-10-CM | POA: Diagnosis not present

## 2015-07-20 DIAGNOSIS — M109 Gout, unspecified: Secondary | ICD-10-CM | POA: Diagnosis not present

## 2015-07-20 DIAGNOSIS — Z96653 Presence of artificial knee joint, bilateral: Secondary | ICD-10-CM | POA: Diagnosis not present

## 2015-07-20 DIAGNOSIS — I10 Essential (primary) hypertension: Secondary | ICD-10-CM | POA: Diagnosis not present

## 2015-07-20 DIAGNOSIS — Z9181 History of falling: Secondary | ICD-10-CM | POA: Diagnosis not present

## 2015-07-20 DIAGNOSIS — H9193 Unspecified hearing loss, bilateral: Secondary | ICD-10-CM | POA: Diagnosis not present

## 2015-07-20 DIAGNOSIS — Z792 Long term (current) use of antibiotics: Secondary | ICD-10-CM | POA: Diagnosis not present

## 2015-07-22 DIAGNOSIS — M170 Bilateral primary osteoarthritis of knee: Secondary | ICD-10-CM | POA: Diagnosis not present

## 2015-07-22 DIAGNOSIS — Z79891 Long term (current) use of opiate analgesic: Secondary | ICD-10-CM | POA: Diagnosis not present

## 2015-07-22 DIAGNOSIS — Z96653 Presence of artificial knee joint, bilateral: Secondary | ICD-10-CM | POA: Diagnosis not present

## 2015-07-22 DIAGNOSIS — I10 Essential (primary) hypertension: Secondary | ICD-10-CM | POA: Diagnosis not present

## 2015-07-22 DIAGNOSIS — M109 Gout, unspecified: Secondary | ICD-10-CM | POA: Diagnosis not present

## 2015-07-22 DIAGNOSIS — Z9181 History of falling: Secondary | ICD-10-CM | POA: Diagnosis not present

## 2015-07-22 DIAGNOSIS — H9193 Unspecified hearing loss, bilateral: Secondary | ICD-10-CM | POA: Diagnosis not present

## 2015-07-22 DIAGNOSIS — N3281 Overactive bladder: Secondary | ICD-10-CM | POA: Diagnosis not present

## 2015-07-22 DIAGNOSIS — Z792 Long term (current) use of antibiotics: Secondary | ICD-10-CM | POA: Diagnosis not present

## 2015-07-22 DIAGNOSIS — R600 Localized edema: Secondary | ICD-10-CM | POA: Diagnosis not present

## 2015-07-26 DIAGNOSIS — I10 Essential (primary) hypertension: Secondary | ICD-10-CM | POA: Diagnosis not present

## 2015-07-26 DIAGNOSIS — Z96653 Presence of artificial knee joint, bilateral: Secondary | ICD-10-CM | POA: Diagnosis not present

## 2015-07-26 DIAGNOSIS — N3281 Overactive bladder: Secondary | ICD-10-CM | POA: Diagnosis not present

## 2015-07-26 DIAGNOSIS — M109 Gout, unspecified: Secondary | ICD-10-CM | POA: Diagnosis not present

## 2015-07-26 DIAGNOSIS — H9193 Unspecified hearing loss, bilateral: Secondary | ICD-10-CM | POA: Diagnosis not present

## 2015-07-26 DIAGNOSIS — M170 Bilateral primary osteoarthritis of knee: Secondary | ICD-10-CM | POA: Diagnosis not present

## 2015-07-26 DIAGNOSIS — Z79891 Long term (current) use of opiate analgesic: Secondary | ICD-10-CM | POA: Diagnosis not present

## 2015-07-26 DIAGNOSIS — Z792 Long term (current) use of antibiotics: Secondary | ICD-10-CM | POA: Diagnosis not present

## 2015-07-26 DIAGNOSIS — R600 Localized edema: Secondary | ICD-10-CM | POA: Diagnosis not present

## 2015-07-26 DIAGNOSIS — Z9181 History of falling: Secondary | ICD-10-CM | POA: Diagnosis not present

## 2015-07-30 DIAGNOSIS — M109 Gout, unspecified: Secondary | ICD-10-CM | POA: Diagnosis not present

## 2015-07-30 DIAGNOSIS — Z79891 Long term (current) use of opiate analgesic: Secondary | ICD-10-CM | POA: Diagnosis not present

## 2015-07-30 DIAGNOSIS — Z96653 Presence of artificial knee joint, bilateral: Secondary | ICD-10-CM | POA: Diagnosis not present

## 2015-07-30 DIAGNOSIS — R600 Localized edema: Secondary | ICD-10-CM | POA: Diagnosis not present

## 2015-07-30 DIAGNOSIS — I10 Essential (primary) hypertension: Secondary | ICD-10-CM | POA: Diagnosis not present

## 2015-07-30 DIAGNOSIS — H9193 Unspecified hearing loss, bilateral: Secondary | ICD-10-CM | POA: Diagnosis not present

## 2015-07-30 DIAGNOSIS — M170 Bilateral primary osteoarthritis of knee: Secondary | ICD-10-CM | POA: Diagnosis not present

## 2015-07-30 DIAGNOSIS — Z792 Long term (current) use of antibiotics: Secondary | ICD-10-CM | POA: Diagnosis not present

## 2015-07-30 DIAGNOSIS — Z9181 History of falling: Secondary | ICD-10-CM | POA: Diagnosis not present

## 2015-07-30 DIAGNOSIS — N3281 Overactive bladder: Secondary | ICD-10-CM | POA: Diagnosis not present

## 2015-08-03 DIAGNOSIS — M109 Gout, unspecified: Secondary | ICD-10-CM | POA: Diagnosis not present

## 2015-08-03 DIAGNOSIS — H9193 Unspecified hearing loss, bilateral: Secondary | ICD-10-CM | POA: Diagnosis not present

## 2015-08-03 DIAGNOSIS — I10 Essential (primary) hypertension: Secondary | ICD-10-CM | POA: Diagnosis not present

## 2015-08-03 DIAGNOSIS — R600 Localized edema: Secondary | ICD-10-CM | POA: Diagnosis not present

## 2015-08-03 DIAGNOSIS — Z79891 Long term (current) use of opiate analgesic: Secondary | ICD-10-CM | POA: Diagnosis not present

## 2015-08-03 DIAGNOSIS — Z96653 Presence of artificial knee joint, bilateral: Secondary | ICD-10-CM | POA: Diagnosis not present

## 2015-08-03 DIAGNOSIS — M170 Bilateral primary osteoarthritis of knee: Secondary | ICD-10-CM | POA: Diagnosis not present

## 2015-08-03 DIAGNOSIS — N3281 Overactive bladder: Secondary | ICD-10-CM | POA: Diagnosis not present

## 2015-08-03 DIAGNOSIS — Z792 Long term (current) use of antibiotics: Secondary | ICD-10-CM | POA: Diagnosis not present

## 2015-08-03 DIAGNOSIS — Z9181 History of falling: Secondary | ICD-10-CM | POA: Diagnosis not present

## 2015-08-05 DIAGNOSIS — Z79891 Long term (current) use of opiate analgesic: Secondary | ICD-10-CM | POA: Diagnosis not present

## 2015-08-05 DIAGNOSIS — M109 Gout, unspecified: Secondary | ICD-10-CM | POA: Diagnosis not present

## 2015-08-05 DIAGNOSIS — Z96653 Presence of artificial knee joint, bilateral: Secondary | ICD-10-CM | POA: Diagnosis not present

## 2015-08-05 DIAGNOSIS — I10 Essential (primary) hypertension: Secondary | ICD-10-CM | POA: Diagnosis not present

## 2015-08-05 DIAGNOSIS — Z792 Long term (current) use of antibiotics: Secondary | ICD-10-CM | POA: Diagnosis not present

## 2015-08-05 DIAGNOSIS — H9193 Unspecified hearing loss, bilateral: Secondary | ICD-10-CM | POA: Diagnosis not present

## 2015-08-05 DIAGNOSIS — Z9181 History of falling: Secondary | ICD-10-CM | POA: Diagnosis not present

## 2015-08-05 DIAGNOSIS — M170 Bilateral primary osteoarthritis of knee: Secondary | ICD-10-CM | POA: Diagnosis not present

## 2015-08-05 DIAGNOSIS — N3281 Overactive bladder: Secondary | ICD-10-CM | POA: Diagnosis not present

## 2015-08-05 DIAGNOSIS — R600 Localized edema: Secondary | ICD-10-CM | POA: Diagnosis not present

## 2015-09-07 DIAGNOSIS — M1A3391 Chronic gout due to renal impairment, unspecified wrist, with tophus (tophi): Secondary | ICD-10-CM | POA: Diagnosis not present

## 2015-09-07 DIAGNOSIS — N189 Chronic kidney disease, unspecified: Secondary | ICD-10-CM | POA: Diagnosis not present

## 2015-09-07 DIAGNOSIS — I10 Essential (primary) hypertension: Secondary | ICD-10-CM | POA: Diagnosis not present

## 2015-09-07 DIAGNOSIS — N3281 Overactive bladder: Secondary | ICD-10-CM | POA: Diagnosis not present

## 2015-09-23 DIAGNOSIS — N2581 Secondary hyperparathyroidism of renal origin: Secondary | ICD-10-CM | POA: Diagnosis not present

## 2015-09-23 DIAGNOSIS — D638 Anemia in other chronic diseases classified elsewhere: Secondary | ICD-10-CM | POA: Diagnosis not present

## 2015-09-23 DIAGNOSIS — N183 Chronic kidney disease, stage 3 (moderate): Secondary | ICD-10-CM | POA: Diagnosis not present

## 2015-09-23 DIAGNOSIS — M1990 Unspecified osteoarthritis, unspecified site: Secondary | ICD-10-CM | POA: Diagnosis not present

## 2015-09-23 DIAGNOSIS — I129 Hypertensive chronic kidney disease with stage 1 through stage 4 chronic kidney disease, or unspecified chronic kidney disease: Secondary | ICD-10-CM | POA: Diagnosis not present

## 2015-09-30 DIAGNOSIS — L84 Corns and callosities: Secondary | ICD-10-CM | POA: Diagnosis not present

## 2015-09-30 DIAGNOSIS — L602 Onychogryphosis: Secondary | ICD-10-CM | POA: Diagnosis not present

## 2015-09-30 DIAGNOSIS — M2042 Other hammer toe(s) (acquired), left foot: Secondary | ICD-10-CM | POA: Diagnosis not present
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
## 2015-10-25 DIAGNOSIS — M2042 Other hammer toe(s) (acquired), left foot: Secondary | ICD-10-CM | POA: Diagnosis not present

## 2015-11-22 DIAGNOSIS — L84 Corns and callosities: Secondary | ICD-10-CM | POA: Diagnosis not present

## 2015-11-22 DIAGNOSIS — L602 Onychogryphosis: Secondary | ICD-10-CM | POA: Diagnosis not present

## 2016-01-13 DIAGNOSIS — Z23 Encounter for immunization: Secondary | ICD-10-CM | POA: Diagnosis not present

## 2016-01-13 DIAGNOSIS — N3281 Overactive bladder: Secondary | ICD-10-CM | POA: Diagnosis not present

## 2016-01-13 DIAGNOSIS — N189 Chronic kidney disease, unspecified: Secondary | ICD-10-CM | POA: Diagnosis not present

## 2016-01-13 DIAGNOSIS — I10 Essential (primary) hypertension: Secondary | ICD-10-CM | POA: Diagnosis not present

## 2016-01-13 DIAGNOSIS — M1000 Idiopathic gout, unspecified site: Secondary | ICD-10-CM | POA: Diagnosis not present

## 2016-04-27 DIAGNOSIS — N189 Chronic kidney disease, unspecified: Secondary | ICD-10-CM | POA: Diagnosis not present

## 2016-04-27 DIAGNOSIS — R7309 Other abnormal glucose: Secondary | ICD-10-CM | POA: Diagnosis not present

## 2016-04-27 DIAGNOSIS — I10 Essential (primary) hypertension: Secondary | ICD-10-CM | POA: Diagnosis not present

## 2016-04-27 DIAGNOSIS — M1000 Idiopathic gout, unspecified site: Secondary | ICD-10-CM | POA: Diagnosis not present

## 2016-04-27 DIAGNOSIS — Z0000 Encounter for general adult medical examination without abnormal findings: Secondary | ICD-10-CM | POA: Diagnosis not present

## 2016-05-11 DIAGNOSIS — I129 Hypertensive chronic kidney disease with stage 1 through stage 4 chronic kidney disease, or unspecified chronic kidney disease: Secondary | ICD-10-CM | POA: Diagnosis not present

## 2016-05-11 DIAGNOSIS — N183 Chronic kidney disease, stage 3 (moderate): Secondary | ICD-10-CM | POA: Diagnosis not present

## 2016-05-11 DIAGNOSIS — D638 Anemia in other chronic diseases classified elsewhere: Secondary | ICD-10-CM | POA: Diagnosis not present

## 2016-05-11 DIAGNOSIS — N2581 Secondary hyperparathyroidism of renal origin: Secondary | ICD-10-CM | POA: Diagnosis not present

## 2016-05-11 DIAGNOSIS — M109 Gout, unspecified: Secondary | ICD-10-CM | POA: Diagnosis not present

## 2016-08-02 DIAGNOSIS — H2513 Age-related nuclear cataract, bilateral: Secondary | ICD-10-CM | POA: Diagnosis not present

## 2016-08-02 DIAGNOSIS — H401122 Primary open-angle glaucoma, left eye, moderate stage: Secondary | ICD-10-CM | POA: Diagnosis not present

## 2016-08-02 DIAGNOSIS — H401111 Primary open-angle glaucoma, right eye, mild stage: Secondary | ICD-10-CM | POA: Diagnosis not present

## 2016-08-04 DIAGNOSIS — M1A3391 Chronic gout due to renal impairment, unspecified wrist, with tophus (tophi): Secondary | ICD-10-CM | POA: Diagnosis not present

## 2016-08-04 DIAGNOSIS — I10 Essential (primary) hypertension: Secondary | ICD-10-CM | POA: Diagnosis not present

## 2016-08-04 DIAGNOSIS — N189 Chronic kidney disease, unspecified: Secondary | ICD-10-CM | POA: Diagnosis not present

## 2016-11-22 DIAGNOSIS — H2513 Age-related nuclear cataract, bilateral: Secondary | ICD-10-CM | POA: Diagnosis not present

## 2016-11-22 DIAGNOSIS — H401132 Primary open-angle glaucoma, bilateral, moderate stage: Secondary | ICD-10-CM | POA: Diagnosis not present

## 2017-01-22 DIAGNOSIS — M1000 Idiopathic gout, unspecified site: Secondary | ICD-10-CM | POA: Diagnosis not present

## 2017-01-22 DIAGNOSIS — N189 Chronic kidney disease, unspecified: Secondary | ICD-10-CM | POA: Diagnosis not present

## 2017-01-22 DIAGNOSIS — I10 Essential (primary) hypertension: Secondary | ICD-10-CM | POA: Diagnosis not present

## 2017-01-22 DIAGNOSIS — E118 Type 2 diabetes mellitus with unspecified complications: Secondary | ICD-10-CM | POA: Diagnosis not present

## 2017-02-07 DIAGNOSIS — M109 Gout, unspecified: Secondary | ICD-10-CM | POA: Diagnosis not present

## 2017-02-07 DIAGNOSIS — I129 Hypertensive chronic kidney disease with stage 1 through stage 4 chronic kidney disease, or unspecified chronic kidney disease: Secondary | ICD-10-CM | POA: Diagnosis not present

## 2017-02-07 DIAGNOSIS — M1990 Unspecified osteoarthritis, unspecified site: Secondary | ICD-10-CM | POA: Diagnosis not present

## 2017-02-07 DIAGNOSIS — N2581 Secondary hyperparathyroidism of renal origin: Secondary | ICD-10-CM | POA: Diagnosis not present

## 2017-02-07 DIAGNOSIS — D638 Anemia in other chronic diseases classified elsewhere: Secondary | ICD-10-CM | POA: Diagnosis not present

## 2017-02-07 DIAGNOSIS — N390 Urinary tract infection, site not specified: Secondary | ICD-10-CM | POA: Diagnosis not present

## 2017-02-07 DIAGNOSIS — N183 Chronic kidney disease, stage 3 (moderate): Secondary | ICD-10-CM | POA: Diagnosis not present

## 2017-03-21 DIAGNOSIS — H401111 Primary open-angle glaucoma, right eye, mild stage: Secondary | ICD-10-CM | POA: Diagnosis not present

## 2017-03-21 DIAGNOSIS — H401122 Primary open-angle glaucoma, left eye, moderate stage: Secondary | ICD-10-CM | POA: Diagnosis not present

## 2017-03-21 DIAGNOSIS — H2513 Age-related nuclear cataract, bilateral: Secondary | ICD-10-CM | POA: Diagnosis not present

## 2017-05-24 ENCOUNTER — Emergency Department (HOSPITAL_COMMUNITY): Payer: Medicare Other

## 2017-05-24 ENCOUNTER — Emergency Department (HOSPITAL_COMMUNITY)
Admission: EM | Admit: 2017-05-24 | Discharge: 2017-05-24 | Disposition: A | Discharge status: Home / Self Care | Payer: Medicare Other | Attending: Emergency Medicine | Admitting: Emergency Medicine

## 2017-05-24 ENCOUNTER — Encounter (HOSPITAL_COMMUNITY): Payer: Self-pay | Admitting: Emergency Medicine

## 2017-05-24 DIAGNOSIS — Y999 Unspecified external cause status: Secondary | ICD-10-CM | POA: Insufficient documentation

## 2017-05-24 DIAGNOSIS — R51 Headache: Secondary | ICD-10-CM | POA: Diagnosis not present

## 2017-05-24 DIAGNOSIS — I10 Essential (primary) hypertension: Secondary | ICD-10-CM | POA: Diagnosis not present

## 2017-05-24 DIAGNOSIS — Y929 Unspecified place or not applicable: Secondary | ICD-10-CM | POA: Diagnosis not present

## 2017-05-24 DIAGNOSIS — S0990XA Unspecified injury of head, initial encounter: Secondary | ICD-10-CM

## 2017-05-24 DIAGNOSIS — S0083XA Contusion of other part of head, initial encounter: Secondary | ICD-10-CM

## 2017-05-24 DIAGNOSIS — S0993XA Unspecified injury of face, initial encounter: Secondary | ICD-10-CM | POA: Diagnosis not present

## 2017-05-24 DIAGNOSIS — Y9301 Activity, walking, marching and hiking: Secondary | ICD-10-CM | POA: Insufficient documentation

## 2017-05-24 DIAGNOSIS — Z79899 Other long term (current) drug therapy: Secondary | ICD-10-CM | POA: Insufficient documentation

## 2017-05-24 DIAGNOSIS — W19XXXA Unspecified fall, initial encounter: Secondary | ICD-10-CM

## 2017-05-24 DIAGNOSIS — W0110XA Fall on same level from slipping, tripping and stumbling with subsequent striking against unspecified object, initial encounter: Secondary | ICD-10-CM | POA: Diagnosis not present

## 2017-05-24 MED ORDER — ACETAMINOPHEN 325 MG PO TABS
650.0000 mg | ORAL_TABLET | Freq: Once | ORAL | Status: AC
Start: 2017-05-24 — End: 2017-05-24
  Administered 2017-05-24: 650 mg via ORAL
  Filled 2017-05-24: qty 2

## 2017-05-24 NOTE — ED Provider Notes (Signed)
[facility] EMERGENCY DEPARTMENT Provider Note   CSN: [id number] Arrival date & time: 05/24/17  0808     History   Chief Complaint Chief Complaint  Patient presents with  . Fall    HPI Sharon Ramos is a 77 y.o. female.  HPI  77 year old female presents with fall and head injury.  History is taken through her daughter who does sign language to interpret as the patient is deaf.  The patient remembers getting up and then tried to grab onto a railing and missed and hit the dresser.  Did not lose consciousness.  Has pain over her right forehead and her right cheek which is significantly swollen.  Has not taken anything for the pain.  Sometime in the middle the night she fell and hit the [employer].  She is not on blood thinners.  Has some mild right-sided neck pain but no midline pain.  No weakness or numbness in her extremities.  No chest pain or shortness of breath.  No preceding dizziness.  Past Medical History:  Diagnosis Date  . Hypertension     There are no active problems to display for this patient.   Past Surgical History:  Procedure Laterality Date  . KNEE SURGERY       OB History   None      Home Medications    Prior to Admission medications   Medication Sig Start Date End Date Taking? Authorizing Provider  furosemide (LASIX) 20 MG tablet Take 20 mg by mouth daily.    [provider]  oxyCODONE-acetaminophen (PERCOCET/ROXICET) 5-325 MG per tablet Take 1 tablet by mouth every 6 (six) hours as needed for moderate pain or severe pain. 12/12/12   Cristobal Goldmann, PA-C    Family History No family history on file.  Social History Social History   Tobacco Use  . Smoking status: Never Smoker  Substance Use Topics  . Alcohol use: Yes    Alcohol/week: 12.6 oz    Types: 21 Cans of beer per week  . Drug use: No     Allergies   Patient has no known allergies.   Review of Systems Review of Systems  Unable to perform ROS: Other      Physical Exam Updated Vital Signs BP (!) 167/77 (BP Location: Right Arm)   Pulse 69   Temp 97.6 F (36.4 C) (Oral)   Resp 17   SpO2 100%   Physical Exam  Constitutional: She appears well-developed and well-nourished. No distress.  HENT:  Head: Normocephalic. Head is with contusion.    Right Ear: External ear normal.  Left Ear: External ear normal.  Nose: Nose normal.  Eyes: EOM are normal. Right eye exhibits no discharge. Left eye exhibits no discharge.  Neck: Normal range of motion. Neck supple. Muscular tenderness present. No spinous process tenderness present. Normal range of motion present.    Cardiovascular: Normal rate, regular rhythm and normal heart sounds.  Pulmonary/Chest: Effort normal and breath sounds normal.  Abdominal: Soft. There is no tenderness.  Neurological: She is alert.  Awake, alert. CN 3-12 grossly intact. 5/5 strength in all 4 extremities. Grossly normal sensation. Normal finger to nose.   Skin: Skin is warm and dry. She is not diaphoretic.  Nursing note and vitals reviewed.    ED Treatments / Results  Labs (all labs ordered are listed, but only abnormal results are displayed) Labs Reviewed - No data to display  EKG None  Radiology Ct Head Wo Contrast  Result  Date: 05/24/2017 CLINICAL DATA:  Recent fall with right facial pain, initial encounter EXAM: CT HEAD WITHOUT CONTRAST CT MAXILLOFACIAL WITHOUT CONTRAST TECHNIQUE: Multidetector CT imaging of the head and maxillofacial structures were performed using the standard protocol without intravenous contrast. Multiplanar CT image reconstructions of the maxillofacial structures were also generated. COMPARISON:  10/07/2015 FINDINGS: CT HEAD FINDINGS Brain: No evidence of acute infarction, hemorrhage, hydrocephalus, extra-axial collection or mass lesion/mass effect. Vascular: No hyperdense vessel or unexpected calcification. Skull: Normal. Negative for fracture or focal lesion. Other: None. CT  MAXILLOFACIAL FINDINGS Osseous: Degenerative changes of the visualized cervical spine are seen. No acute bony abnormality is noted. Orbits: Negative. No traumatic or inflammatory finding. Sinuses: Clear. Soft tissues: Considerable soft tissue swelling is noted over the right cheek and extending superiorly around the inferior orbital rim. A focal hematoma is noted subcutaneously just anterior and lateral to the right maxillary sinus which measures 2.6 x 1.8 cm. No other significant soft tissue abnormality is noted. IMPRESSION: CT of the head: No acute intracranial abnormality noted. CT of the maxillofacial bones: Significant soft tissue injury with a focal hematoma in the region of the right cheek as described. No acute bony abnormality is noted. Electronically Signed   By: Alcide Clever M.D.   On: 05/24/2017 09:49   Ct Maxillofacial Wo Contrast  Result Date: 05/24/2017 CLINICAL DATA:  Recent fall with right facial pain, initial encounter EXAM: CT HEAD WITHOUT CONTRAST CT MAXILLOFACIAL WITHOUT CONTRAST TECHNIQUE: Multidetector CT imaging of the head and maxillofacial structures were performed using the standard protocol without intravenous contrast. Multiplanar CT image reconstructions of the maxillofacial structures were also generated. COMPARISON:  10/07/2015 FINDINGS: CT HEAD FINDINGS Brain: No evidence of acute infarction, hemorrhage, hydrocephalus, extra-axial collection or mass lesion/mass effect. Vascular: No hyperdense vessel or unexpected calcification. Skull: Normal. Negative for fracture or focal lesion. Other: None. CT MAXILLOFACIAL FINDINGS Osseous: Degenerative changes of the visualized cervical spine are seen. No acute bony abnormality is noted. Orbits: Negative. No traumatic or inflammatory finding. Sinuses: Clear. Soft tissues: Considerable soft tissue swelling is noted over the right cheek and extending superiorly around the inferior orbital rim. A focal hematoma is noted subcutaneously just  anterior and lateral to the right maxillary sinus which measures 2.6 x 1.8 cm. No other significant soft tissue abnormality is noted. IMPRESSION: CT of the head: No acute intracranial abnormality noted. CT of the maxillofacial bones: Significant soft tissue injury with a focal hematoma in the region of the right cheek as described. No acute bony abnormality is noted. Electronically Signed   By: Alcide Clever M.D.   On: 05/24/2017 09:49    Procedures Procedures (including critical care time)  Medications Ordered in ED Medications  acetaminophen (TYLENOL) tablet 650 mg (650 mg Oral Given 05/24/17 1139)     Initial Impression / Assessment and Plan / ED Course  I have reviewed the triage vital signs and the nursing notes.  Pertinent labs & imaging results that were available during my care of the patient were reviewed by me and considered in my medical decision making (see chart for details).     Patient presents after mechanical fall.  Her CT head and face are unremarkable.  She has some lateral neck pain but no midline or paraspinal neck tenderness or pain.  I think that is muscular pain.  She has an impressive hematoma over her right cheek but no underlying fracture.  She is not on blood thinners.  We discussed return precautions for possible delayed head  injury but she otherwise appears stable for discharge home with p.o. pain control and ice.  Final Clinical Impressions(s) / ED Diagnoses   Final diagnoses:  Fall, initial encounter  Contusion of face, initial encounter  Minor head injury without loss of consciousness, initial encounter    ED Discharge Orders    None       Pricilla Loveless, MD 05/24/17 1222

## 2017-05-24 NOTE — ED Notes (Signed)
Pt is hearing impaired. Pt's daughter is present.

## 2017-05-24 NOTE — ED Triage Notes (Signed)
Patient to ED following mechanical fall this morning with R sided facial and head swelling. No LOC, A&O to baseline, neuro intact. PERRL.

## 2017-05-24 NOTE — Discharge Instructions (Signed)
If your headache worsens or you develop vomiting, confusion, or other new or sick concerning symptoms then return to the ER for evaluation.  Take Tylenol for your pain and apply ice to the swelling/hematoma on your face.

## 2017-06-18 DIAGNOSIS — M1000 Idiopathic gout, unspecified site: Secondary | ICD-10-CM | POA: Diagnosis not present

## 2017-06-18 DIAGNOSIS — N189 Chronic kidney disease, unspecified: Secondary | ICD-10-CM | POA: Diagnosis not present

## 2017-06-18 DIAGNOSIS — I10 Essential (primary) hypertension: Secondary | ICD-10-CM | POA: Diagnosis not present

## 2017-06-18 DIAGNOSIS — M130 Polyarthritis, unspecified: Secondary | ICD-10-CM | POA: Diagnosis not present

## 2017-07-31 DIAGNOSIS — H401122 Primary open-angle glaucoma, left eye, moderate stage: Secondary | ICD-10-CM | POA: Diagnosis not present

## 2017-07-31 DIAGNOSIS — H25813 Combined forms of age-related cataract, bilateral: Secondary | ICD-10-CM | POA: Diagnosis not present

## 2017-07-31 DIAGNOSIS — H401111 Primary open-angle glaucoma, right eye, mild stage: Secondary | ICD-10-CM | POA: Diagnosis not present

## 2017-08-22 DIAGNOSIS — N189 Chronic kidney disease, unspecified: Secondary | ICD-10-CM | POA: Diagnosis not present

## 2017-08-22 DIAGNOSIS — M1000 Idiopathic gout, unspecified site: Secondary | ICD-10-CM | POA: Diagnosis not present

## 2017-08-22 DIAGNOSIS — M130 Polyarthritis, unspecified: Secondary | ICD-10-CM | POA: Diagnosis not present

## 2017-08-22 DIAGNOSIS — I10 Essential (primary) hypertension: Secondary | ICD-10-CM | POA: Diagnosis not present

## 2017-08-27 DIAGNOSIS — M130 Polyarthritis, unspecified: Secondary | ICD-10-CM | POA: Diagnosis not present

## 2017-08-27 DIAGNOSIS — L03119 Cellulitis of unspecified part of limb: Secondary | ICD-10-CM | POA: Diagnosis not present

## 2017-08-27 DIAGNOSIS — M1000 Idiopathic gout, unspecified site: Secondary | ICD-10-CM | POA: Diagnosis not present

## 2017-09-13 ENCOUNTER — Ambulatory Visit (INDEPENDENT_AMBULATORY_CARE_PROVIDER_SITE_OTHER): Payer: Self-pay | Admitting: Orthopedic Surgery

## 2017-09-27 ENCOUNTER — Ambulatory Visit (INDEPENDENT_AMBULATORY_CARE_PROVIDER_SITE_OTHER): Payer: Medicare Other | Admitting: Orthopedic Surgery

## 2017-09-27 ENCOUNTER — Ambulatory Visit (INDEPENDENT_AMBULATORY_CARE_PROVIDER_SITE_OTHER): Payer: Self-pay

## 2017-09-27 ENCOUNTER — Encounter (INDEPENDENT_AMBULATORY_CARE_PROVIDER_SITE_OTHER): Payer: Self-pay | Admitting: Orthopedic Surgery

## 2017-09-27 DIAGNOSIS — M79672 Pain in left foot: Secondary | ICD-10-CM

## 2017-09-27 DIAGNOSIS — M2142 Flat foot [pes planus] (acquired), left foot: Secondary | ICD-10-CM

## 2017-09-27 DIAGNOSIS — R29898 Other symptoms and signs involving the musculoskeletal system: Secondary | ICD-10-CM

## 2017-09-27 NOTE — Progress Notes (Signed)
Office Visit Note   Patient: Sharon Ramos           Date of Birth: December 06, 1940           MRN: [id number] Visit Date: 09/27/2017              Requested by: Renaye Rakers, MD [address] [place], [place] [address] PCP: System, Pcp Not In  Chief Complaint  Patient presents with  . Right Foot - Pain      HPI: Patient is a 77 year old woman who presents complaining of pain across her midfoot.  Patient states she has had symptoms for years she states her foot feels weak she states she does not have good stability.  Patient is seen with an interpreter she is deaf.  Past medical history is updated patient does have a history of alcoholism arthritis bladder problems blood clots glaucoma hypertension kidney disease and sleep apnea.  She states she is on the [place].  Assessment & Plan: Visit Diagnoses:  1. Pain in left foot   2. Weakness of left foot     Plan: Recommend patient use a stiff soled running or walking shoe to decrease pressure across the midfoot recommended Achilles stretching to unload the forefoot.  Follow-Up Instructions: Return if symptoms worsen or fail to improve.   Ortho Exam  Patient is alert, oriented, no adenopathy, well-dressed, normal affect, normal respiratory effort. Examination patient has no subtalar motion of the left limited range of motion of the ankle on the left with dorsiflexion about 20 degrees short of neutral due to her Achilles contracture.  There are no plantar ulcers.  Patient has pain reproduced with palpation across the midfoot.  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  Patient is ambulating in a wheelchair.  Imaging: No results found. No images are attached to the encounter.  Labs: Lab Results  Component Value Date   ESRSEDRATE 30 (H) 05/21/2010   ESRSEDRATE 20 05/06/2010   CRP 2.4 (H) 05/21/2010   LABURIC 7.6 (H) 05/06/2010   REPTSTATUS 05/25/2010 FINAL 05/22/2010   REPTSTATUS  05/27/2010 FINAL 05/22/2010   GRAMSTAIN  05/22/2010    RARE WBC PRESENT, PREDOMINANTLY PMN NO SQUAMOUS EPITHELIAL CELLS SEEN NO ORGANISMS SEEN   GRAMSTAIN  05/22/2010    RARE WBC PRESENT, PREDOMINANTLY PMN NO SQUAMOUS EPITHELIAL CELLS SEEN NO ORGANISMS SEEN   CULT NO GROWTH 2 DAYS 05/22/2010   CULT NO ANAEROBES ISOLATED 05/22/2010   LABORGA PSEUDOMONAS AERUGINOSA 06/17/2009   LABORGA ENTEROBACTER CLOACAE 06/17/2009     Lab Results  Component Value Date   ALBUMIN 3.0 (L) 05/20/2010   ALBUMIN 3.9 05/06/2010   ALBUMIN 2.9 (L) 06/17/2009   LABURIC 7.6 (H) 05/06/2010    There is no height or weight on file to calculate BMI.  Orders:  Orders Placed This Encounter  Procedures  . XR Foot Complete Left   No orders of the defined types were placed in this encounter.    Procedures: No procedures performed  Clinical Data: No additional findings.  ROS:  All other systems negative, except as noted in the HPI. Review of Systems  Objective: Vital Signs: There were no vitals taken for this visit.  Specialty Comments:  No specialty comments available.  PMFS History: There are no active problems to display for this patient.  Past Medical History:  Diagnosis Date  . Hypertension     History reviewed. No pertinent family history.  Past Surgical History:  Procedure  Laterality Date  . KNEE SURGERY     Social History   Occupational History  . Not on file  Tobacco Use  . Smoking status: Never Smoker  Substance and Sexual Activity  . Alcohol use: Yes    Alcohol/week: 21.0 standard drinks    Types: 21 Cans of beer per week  . Drug use: No  . Sexual activity: Not on file

## 2017-12-10 DIAGNOSIS — N3281 Overactive bladder: Secondary | ICD-10-CM | POA: Diagnosis not present

## 2017-12-10 DIAGNOSIS — R600 Localized edema: Secondary | ICD-10-CM | POA: Diagnosis not present

## 2017-12-10 DIAGNOSIS — M130 Polyarthritis, unspecified: Secondary | ICD-10-CM | POA: Diagnosis not present

## 2017-12-10 DIAGNOSIS — N189 Chronic kidney disease, unspecified: Secondary | ICD-10-CM | POA: Diagnosis not present

## 2017-12-11 DIAGNOSIS — M1000 Idiopathic gout, unspecified site: Secondary | ICD-10-CM | POA: Diagnosis not present

## 2017-12-11 DIAGNOSIS — M130 Polyarthritis, unspecified: Secondary | ICD-10-CM | POA: Diagnosis not present

## 2017-12-11 DIAGNOSIS — N189 Chronic kidney disease, unspecified: Secondary | ICD-10-CM | POA: Diagnosis not present

## 2017-12-11 DIAGNOSIS — I10 Essential (primary) hypertension: Secondary | ICD-10-CM | POA: Diagnosis not present

## 2017-12-24 DIAGNOSIS — H401111 Primary open-angle glaucoma, right eye, mild stage: Secondary | ICD-10-CM | POA: Diagnosis not present

## 2017-12-24 DIAGNOSIS — H401123 Primary open-angle glaucoma, left eye, severe stage: Secondary | ICD-10-CM | POA: Diagnosis not present

## 2018-02-01 DIAGNOSIS — M109 Gout, unspecified: Secondary | ICD-10-CM | POA: Diagnosis not present

## 2018-02-01 DIAGNOSIS — N183 Chronic kidney disease, stage 3 (moderate): Secondary | ICD-10-CM | POA: Diagnosis not present

## 2018-02-01 DIAGNOSIS — N2581 Secondary hyperparathyroidism of renal origin: Secondary | ICD-10-CM | POA: Diagnosis not present

## 2018-02-01 DIAGNOSIS — D638 Anemia in other chronic diseases classified elsewhere: Secondary | ICD-10-CM | POA: Diagnosis not present

## 2018-02-01 DIAGNOSIS — I129 Hypertensive chronic kidney disease with stage 1 through stage 4 chronic kidney disease, or unspecified chronic kidney disease: Secondary | ICD-10-CM | POA: Diagnosis not present

## 2018-02-05 DIAGNOSIS — H04123 Dry eye syndrome of bilateral lacrimal glands: Secondary | ICD-10-CM | POA: Diagnosis not present

## 2018-02-05 DIAGNOSIS — H401111 Primary open-angle glaucoma, right eye, mild stage: Secondary | ICD-10-CM | POA: Diagnosis not present

## 2018-02-05 DIAGNOSIS — H2513 Age-related nuclear cataract, bilateral: Secondary | ICD-10-CM | POA: Diagnosis not present

## 2018-02-05 DIAGNOSIS — H1045 Other chronic allergic conjunctivitis: Secondary | ICD-10-CM | POA: Diagnosis not present

## 2018-02-05 DIAGNOSIS — H401123 Primary open-angle glaucoma, left eye, severe stage: Secondary | ICD-10-CM | POA: Diagnosis not present

## 2018-04-11 DIAGNOSIS — R600 Localized edema: Secondary | ICD-10-CM | POA: Diagnosis not present

## 2018-04-11 DIAGNOSIS — Z0000 Encounter for general adult medical examination without abnormal findings: Secondary | ICD-10-CM | POA: Diagnosis not present

## 2018-09-16 DIAGNOSIS — M109 Gout, unspecified: Secondary | ICD-10-CM | POA: Diagnosis not present

## 2018-09-16 DIAGNOSIS — N183 Chronic kidney disease, stage 3 (moderate): Secondary | ICD-10-CM | POA: Diagnosis not present

## 2018-09-16 DIAGNOSIS — N2581 Secondary hyperparathyroidism of renal origin: Secondary | ICD-10-CM | POA: Diagnosis not present

## 2018-09-16 DIAGNOSIS — D638 Anemia in other chronic diseases classified elsewhere: Secondary | ICD-10-CM | POA: Diagnosis not present

## 2018-09-16 DIAGNOSIS — Z23 Encounter for immunization: Secondary | ICD-10-CM | POA: Diagnosis not present

## 2018-09-16 DIAGNOSIS — I129 Hypertensive chronic kidney disease with stage 1 through stage 4 chronic kidney disease, or unspecified chronic kidney disease: Secondary | ICD-10-CM | POA: Diagnosis not present

## 2018-09-23 ENCOUNTER — Other Ambulatory Visit: Payer: Self-pay | Admitting: Family Medicine

## 2018-09-23 ENCOUNTER — Other Ambulatory Visit: Payer: Self-pay

## 2018-09-23 ENCOUNTER — Ambulatory Visit
Admission: RE | Admit: 2018-09-23 | Discharge: 2018-09-23 | Disposition: A | Discharge status: Home / Self Care | Payer: Medicare Other | Source: Ambulatory Visit | Attending: Family Medicine | Admitting: Family Medicine

## 2018-09-23 DIAGNOSIS — M130 Polyarthritis, unspecified: Secondary | ICD-10-CM

## 2018-09-23 DIAGNOSIS — M25511 Pain in right shoulder: Secondary | ICD-10-CM | POA: Diagnosis not present

## 2018-09-23 DIAGNOSIS — I10 Essential (primary) hypertension: Secondary | ICD-10-CM | POA: Diagnosis not present

## 2018-09-23 DIAGNOSIS — N3281 Overactive bladder: Secondary | ICD-10-CM | POA: Diagnosis not present

## 2019-01-23 DIAGNOSIS — M25569 Pain in unspecified knee: Secondary | ICD-10-CM | POA: Diagnosis not present

## 2019-01-28 ENCOUNTER — Ambulatory Visit: Payer: Medicare Other | Admitting: Podiatry

## 2019-01-30 ENCOUNTER — Other Ambulatory Visit: Payer: Self-pay

## 2019-01-30 ENCOUNTER — Encounter: Payer: Self-pay | Admitting: Podiatry

## 2019-01-30 ENCOUNTER — Ambulatory Visit: Payer: Medicare Other | Admitting: Podiatry

## 2019-01-30 DIAGNOSIS — M216X1 Other acquired deformities of right foot: Secondary | ICD-10-CM

## 2019-01-30 DIAGNOSIS — M216X9 Other acquired deformities of unspecified foot: Secondary | ICD-10-CM

## 2019-01-30 DIAGNOSIS — M79672 Pain in left foot: Secondary | ICD-10-CM | POA: Diagnosis not present

## 2019-01-30 DIAGNOSIS — M79671 Pain in right foot: Secondary | ICD-10-CM

## 2019-01-30 DIAGNOSIS — M216X2 Other acquired deformities of left foot: Secondary | ICD-10-CM

## 2019-01-30 DIAGNOSIS — Q828 Other specified congenital malformations of skin: Secondary | ICD-10-CM | POA: Diagnosis not present

## 2019-02-04 NOTE — Progress Notes (Signed)
Subjective:   Patient ID: Sharon Ramos, female   DOB: 79 y.o.   MRN: [id number]   HPI 79 year old female presents the office with concerns of painful calluses to the balls of both of her feet with the left side worse than the right.  This been ongoing issue for her and she said no significant treatment other than trying over-the-counter products which have not been helping.  Denies any redness or drainage and swelling.  She presents with her daughter as the patient is deaf the daughter is translating.   Review of Systems  All other systems reviewed and are negative.  Past Medical History:  Diagnosis Date  . Hypertension     Past Surgical History:  Procedure Laterality Date  . KNEE SURGERY      No current outpatient medications on file.  No Known Allergies       Objective:  Physical Exam  General: AAO x3, NAD  Dermatological: Hyperkeratotic lesion left foot submetatarsal 1, 5 in the right foot submetatarsal 1.  Upon debridement there is no underlying ulceration, drainage or any signs of infection.  There is no open lesions.  Vascular: Dorsalis Pedis artery and Posterior Tibial artery pedal pulses are 2/4 bilateral with immedate capillary fill time. There is no pain with calf compression, swelling, warmth, erythema.  Chronic swelling bilateral ankles  Neruologic: Grossly intact via light touch bilateral. Protective threshold with Semmes Wienstein monofilament intact to all pedal sites bilateral.   Musculoskeletal: Prominence the metatarsal head plantarly.  Muscular strength 5/5 in all groups tested bilateral.  Gait: Unassisted, Nonantalgic.       Assessment:   79 year old female with preulcerative calluses    Plan:  -Treatment options discussed including all alternatives, risks, and complications -Etiology of symptoms were discussed -Sharply debrided hyperkeratotic lesions x3 without any complications or bleeding.  Discussed moisturizer to the area daily.   Dispensed offloading pads for now but also discussed other options including inserts to take pressure off the calluses and then will proceed with this.  They were seen today by [facility] measurements.  Return in about 3 months (around 04/30/2019) for callus trim .  She will come in sooner to pick up orthotics with.  Vivi Barrack DPM

## 2019-03-14 ENCOUNTER — Ambulatory Visit: Payer: Medicare Other | Attending: Internal Medicine

## 2019-03-14 DIAGNOSIS — Z23 Encounter for immunization: Secondary | ICD-10-CM

## 2019-03-14 NOTE — Progress Notes (Signed)
   Covid-19 Vaccination Clinic  Name:  Sharon Ramos    MRN: [id number] DOB: 05-25-1940  03/14/2019  Sharon Ramos was observed post Covid-19 immunization for 15 minutes without incident. She was provided with Vaccine Information Sheet and instruction to access the V-Safe system.   Sharon Ramos was instructed to call 911 with any severe reactions post vaccine: Marland Kitchen Difficulty breathing  . Swelling of face and throat  . A fast heartbeat  . A bad rash all over body  . Dizziness and weakness   Immunizations Administered    Name Date Dose VIS Date Route   Pfizer COVID-19 Vaccine 03/14/2019  8:36 AM 0.3 mL 12/13/2018 Intramuscular   Manufacturer: [employer], [employer]   Lot: [id number]   NDC: [address]-5

## 2019-04-07 ENCOUNTER — Ambulatory Visit: Payer: Medicare Other | Attending: Internal Medicine

## 2019-04-07 DIAGNOSIS — Z23 Encounter for immunization: Secondary | ICD-10-CM

## 2019-04-07 NOTE — Progress Notes (Signed)
   Covid-19 Vaccination Clinic  Name:  Sharon Ramos    MRN: [id number] DOB: 05/14/1940  04/07/2019  [name] was observed post Covid-19 immunization for 15 minutes without incident. She was provided with Vaccine Information Sheet and instruction to access the V-Safe system.   [name] was instructed to call 911 with any severe reactions post vaccine: Marland Kitchen Difficulty breathing  . Swelling of face and throat  . A fast heartbeat  . A bad rash all over body  . Dizziness and weakness   Immunizations Administered    Name Date Dose VIS Date Route   Pfizer COVID-19 Vaccine 04/07/2019  1:54 PM 0.3 mL 12/13/2018 Intramuscular   Manufacturer: [employer], [employer]   Lot: [id number]   NDC: [address]-9

## 2019-04-10 DIAGNOSIS — H04123 Dry eye syndrome of bilateral lacrimal glands: Secondary | ICD-10-CM | POA: Diagnosis not present

## 2019-04-10 DIAGNOSIS — H2513 Age-related nuclear cataract, bilateral: Secondary | ICD-10-CM | POA: Diagnosis not present

## 2019-04-10 DIAGNOSIS — H401111 Primary open-angle glaucoma, right eye, mild stage: Secondary | ICD-10-CM | POA: Diagnosis not present

## 2019-04-10 DIAGNOSIS — H401123 Primary open-angle glaucoma, left eye, severe stage: Secondary | ICD-10-CM | POA: Diagnosis not present

## 2019-04-30 DIAGNOSIS — H401111 Primary open-angle glaucoma, right eye, mild stage: Secondary | ICD-10-CM | POA: Diagnosis not present

## 2019-04-30 DIAGNOSIS — H2513 Age-related nuclear cataract, bilateral: Secondary | ICD-10-CM | POA: Diagnosis not present

## 2019-04-30 DIAGNOSIS — H401123 Primary open-angle glaucoma, left eye, severe stage: Secondary | ICD-10-CM | POA: Diagnosis not present

## 2019-04-30 DIAGNOSIS — H04123 Dry eye syndrome of bilateral lacrimal glands: Secondary | ICD-10-CM | POA: Diagnosis not present

## 2019-05-05 DIAGNOSIS — D638 Anemia in other chronic diseases classified elsewhere: Secondary | ICD-10-CM | POA: Diagnosis present

## 2019-05-05 DIAGNOSIS — N184 Chronic kidney disease, stage 4 (severe): Secondary | ICD-10-CM | POA: Diagnosis present

## 2019-05-05 DIAGNOSIS — R4701 Aphasia: Secondary | ICD-10-CM | POA: Diagnosis present

## 2019-05-05 DIAGNOSIS — N3281 Overactive bladder: Secondary | ICD-10-CM | POA: Insufficient documentation

## 2020-01-15 DIAGNOSIS — H9190 Unspecified hearing loss, unspecified ear: Secondary | ICD-10-CM | POA: Insufficient documentation

## 2020-05-31 DIAGNOSIS — F22 Delusional disorders: Secondary | ICD-10-CM | POA: Insufficient documentation

## 2020-05-31 DIAGNOSIS — F411 Generalized anxiety disorder: Secondary | ICD-10-CM | POA: Diagnosis present

## 2020-07-24 ENCOUNTER — Emergency Department (HOSPITAL_COMMUNITY): Payer: Medicare Other

## 2020-07-24 ENCOUNTER — Emergency Department (HOSPITAL_COMMUNITY)
Admission: EM | Admit: 2020-07-24 | Discharge: 2020-07-24 | Disposition: A | Discharge status: Home / Self Care | Payer: Medicare Other | Attending: Emergency Medicine | Admitting: Emergency Medicine

## 2020-07-24 ENCOUNTER — Other Ambulatory Visit: Payer: Self-pay

## 2020-07-24 DIAGNOSIS — W19XXXA Unspecified fall, initial encounter: Secondary | ICD-10-CM

## 2020-07-24 DIAGNOSIS — M25561 Pain in right knee: Secondary | ICD-10-CM | POA: Diagnosis not present

## 2020-07-24 DIAGNOSIS — Y92009 Unspecified place in unspecified non-institutional (private) residence as the place of occurrence of the external cause: Secondary | ICD-10-CM | POA: Diagnosis not present

## 2020-07-24 DIAGNOSIS — W01198A Fall on same level from slipping, tripping and stumbling with subsequent striking against other object, initial encounter: Secondary | ICD-10-CM | POA: Diagnosis not present

## 2020-07-24 DIAGNOSIS — I10 Essential (primary) hypertension: Secondary | ICD-10-CM | POA: Diagnosis not present

## 2020-07-24 DIAGNOSIS — S0181XA Laceration without foreign body of other part of head, initial encounter: Secondary | ICD-10-CM | POA: Insufficient documentation

## 2020-07-24 DIAGNOSIS — M25511 Pain in right shoulder: Secondary | ICD-10-CM | POA: Insufficient documentation

## 2020-07-24 DIAGNOSIS — Y9301 Activity, walking, marching and hiking: Secondary | ICD-10-CM | POA: Insufficient documentation

## 2020-07-24 DIAGNOSIS — S0990XA Unspecified injury of head, initial encounter: Secondary | ICD-10-CM | POA: Diagnosis present

## 2020-07-24 MED ORDER — LIDOCAINE HCL (PF) 1 % IJ SOLN
10.0000 mL | Freq: Once | INTRAMUSCULAR | Status: AC
  Administered 2020-07-24: 10 mL
  Filled 2020-07-24: qty 10

## 2020-07-24 MED ORDER — ACETAMINOPHEN 500 MG PO TABS
1000.0000 mg | ORAL_TABLET | Freq: Once | ORAL | Status: AC
  Administered 2020-07-24: 1000 mg via ORAL
  Filled 2020-07-24: qty 2
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
## 2020-07-24 NOTE — ED Provider Notes (Signed)
[facility] EMERGENCY DEPARTMENT Provider Note   CSN: [id number] Arrival date & time: 07/24/20  0865     History Chief Complaint  Patient presents with   [name]    Sharon Ramos is a 80 y.o. female with pertinent past medical history of hypertension that presents to the emerge department today for mechanical fall.  Patient speaks sign language, however wanting daughter to translate for her who was in the room.  States that today she was try to get up from her bed stand, states that she placed her hand on the dresser to get up which she normally does, however her hand slipped and she fell on her right side.  Has a large laceration noted to her forehead on the right side, complaining of right shoulder pain and right knee pain.  Did not lose consciousness.  Is not any blood thinners.  Patient states that she is fairly healthy, lives alone at home and walks with a walker.  Has not taken anything for the pain. Was in her normal health prior to this.  Denies any headache, vision changes, nausea, vomiting, neck pain, back pain, abdominal pain.  Denies any chest pain or shortness of breath. Up to date on Tetanus.   HPI     Past Medical History:  Diagnosis Date   Hypertension     There are no problems to display for this patient.   Past Surgical History:  Procedure Laterality Date   KNEE SURGERY       OB History   No obstetric history on file.     No family history on file.  Social History   Tobacco Use   Smoking status: Never   Smokeless tobacco: Never  Substance Use Topics   Alcohol use: Yes    Alcohol/week: 21.0 standard drinks    Types: 21 Cans of beer per week   Drug use: No    Home Medications Prior to Admission medications   Not on File    Allergies    Patient has no known allergies.  Review of Systems   Review of Systems  Constitutional:  Negative for chills, diaphoresis, fatigue and fever.  HENT:  Negative for congestion, sore throat  and trouble swallowing.   Eyes:  Negative for pain and visual disturbance.  Respiratory:  Negative for cough, shortness of breath and wheezing.   Cardiovascular:  Negative for chest pain, palpitations and leg swelling.  Gastrointestinal:  Negative for abdominal distention, abdominal pain, diarrhea, nausea and vomiting.  Genitourinary:  Negative for difficulty urinating.  Musculoskeletal:  Positive for arthralgias. Negative for back pain, neck pain and neck stiffness.  Skin:  Positive for wound. Negative for pallor.  Neurological:  Negative for dizziness, speech difficulty, weakness and headaches.  Psychiatric/Behavioral:  Negative for confusion.    Physical Exam Updated Vital Signs BP (!) 162/100   Pulse 70   Temp 97.7 F (36.5 C) (Oral)   Resp 16   SpO2 99%   Physical Exam Constitutional:      General: She is not in acute distress.    Appearance: Normal appearance. She is not ill-appearing, toxic-appearing or diaphoretic.  HENT:     Head: Normocephalic.     Comments: Large laceration to right forehead, about 20 cm long.  Bleeding controlled.  No arterial bleed.  No hematomas to scalp.  No other lacerations.    Mouth/Throat:     Mouth: Mucous membranes are moist.     Pharynx: Oropharynx is clear.  Eyes:  Extraocular Movements: Extraocular movements intact.     Pupils: Pupils are equal, round, and reactive to light.     Comments: Right eye swollen shut, however when able to open eyelid, vision is normal.  Able to follow EOMs.  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  Cardiovascular:     Rate and Rhythm: Normal rate and regular rhythm.  Pulmonary:     Effort: Pulmonary effort is normal. No respiratory distress.     Breath sounds: Normal breath sounds. No wheezing.     Comments: No tenderness to chest, no feel chest, no rib pain. Abdominal:     General: Abdomen is flat. There is no distension.     Tenderness: There is no  abdominal tenderness.     Comments: No abdominal tenderness or ecchymosis.  Musculoskeletal:        General: No swelling, tenderness, deformity or signs of injury. Normal range of motion.     Cervical back: Normal range of motion. No rigidity or tenderness.     Comments: Normal range of motion to all extremities. No cervical, thoracic or lumbar midline tenderness.  Normal range of motion to upper extremities including right shoulder.  Good strength and sensation.  Good finger strength.  No tenderness to shoulder on right side.  Radial pulse 2+ bilaterally.  Lower extremities bilaterally with good strength to knees and feet.  DP pulses 1+ bilaterally.  Lymphadenopathy:     Cervical: No cervical adenopathy.  Skin:    General: Skin is warm and dry.     Capillary Refill: Capillary refill takes less than 2 seconds.  Neurological:     General: No focal deficit present.     Mental Status: She is alert and oriented to person, place, and time.     Cranial Nerves: No cranial nerve deficit.     Sensory: No sensory deficit.     Motor: No weakness.     Coordination: Coordination normal.     Comments: Alert. Clear speech. No facial droop. CNIII-XII grossly intact. Bilateral upper and lower extremities' sensation grossly intact. 5/5 symmetric strength with grip strength and with plantar and dorsi flexion bilaterally. Normal finger to nose bilaterally. Negative pronator drift. Gait is steady and intact    Psychiatric:        Mood and Affect: Mood normal.        Behavior: Behavior normal.        Thought Content: Thought content normal.        Judgment: Judgment normal.    ED Results / Procedures / Treatments   Labs (all labs ordered are listed, but only abnormal results are displayed) Labs Reviewed - No data to display  EKG None  Radiology DG Shoulder Right  Result Date: 07/24/2020 CLINICAL DATA:  Fall, right shoulder pain EXAM: RIGHT SHOULDER - 2+ VIEW COMPARISON:  None. FINDINGS: No fracture.  No glenohumeral dislocation. No evidence of acromioclavicular separation. Moderate right acromioclavicular joint osteoarthritis. Mild right glenohumeral joint osteoarthritis. No suspicious focal osseous lesions. No radiopaque foreign bodies. No pathologic soft tissue calcifications. IMPRESSION: No fracture or malalignment. 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 Electronically Signed   By: Delbert Phenix M.D.   On: 07/24/2020 08:17   CT Head Wo Contrast  Result Date: 07/24/2020 CLINICAL DATA:  Fall.  Head trauma, minor.  Head laceration. EXAM: CT HEAD WITHOUT CONTRAST CT CERVICAL SPINE WITHOUT CONTRAST TECHNIQUE: Multidetector CT imaging of the head  and cervical spine was performed following the standard protocol without intravenous contrast. Multiplanar CT image reconstructions of the cervical spine were also generated. COMPARISON:  05/24/2017 head CT. 10/08/2013 head and cervical spine CT. FINDINGS: CT HEAD FINDINGS Brain: No evidence of parenchymal hemorrhage or extra-axial fluid collection. No mass lesion, mass effect, or midline shift. No CT evidence of acute infarction. Cerebral volume is age appropriate. No ventriculomegaly. Vascular: No acute abnormality. Skull: No evidence of calvarial fracture. Small to moderate right frontal scalp contusion and laceration. Sinuses/Orbits: No fluid levels. Symmetric mucoperiosteal thickening in the maxillary sinuses. Other:  The mastoid air cells are unopacified. CT CERVICAL SPINE FINDINGS Alignment: Mild straightening of the cervical spine. No facet subluxation. Dens is well positioned between the lateral masses of C1. Skull base and vertebrae: No acute fracture. No primary bone lesion or focal pathologic process. Soft tissues and spinal canal: No prevertebral edema. No visible canal hematoma. Disc levels: Moderate multilevel cervical degenerative disc disease, most prominent at C6-7. Moderate bilateral facet arthropathy. No significant  degenerative foraminal stenosis. Upper chest: No acute abnormality. Other: Visualized mastoid air cells appear clear. Hypodense 1.4 cm left thyroid nodule. Not clinically significant; no follow-up imaging recommended (ref: J Am Coll Radiol. 2015 Feb;12(2): 143-50). No pathologically enlarged cervical nodes. IMPRESSION: 1. Small to moderate right frontal scalp contusion and laceration. 2. No evidence of acute intracranial abnormality. No evidence of calvarial fracture. 3. Chronic appearing bilateral maxillary sinusitis. 4. No cervical spine fracture or subluxation. 5. Moderate multilevel degenerative changes in the cervical spine as detailed. Electronically Signed   By: Delbert Phenix M.D.   On: 07/24/2020 08:52   CT Cervical Spine Wo Contrast  Result Date: 07/24/2020 CLINICAL DATA:  Fall.  Head trauma, minor.  Head laceration. EXAM: CT HEAD WITHOUT CONTRAST CT CERVICAL SPINE WITHOUT CONTRAST TECHNIQUE: Multidetector CT imaging of the head and cervical spine was performed following the standard protocol without intravenous contrast. Multiplanar CT image reconstructions of the cervical spine were also generated. COMPARISON:  05/24/2017 head CT. 10/08/2013 head and cervical spine CT. FINDINGS: CT HEAD FINDINGS Brain: No evidence of parenchymal hemorrhage or extra-axial fluid collection. No mass lesion, mass effect, or midline shift. No CT evidence of acute infarction. Cerebral volume is age appropriate. No ventriculomegaly. Vascular: No acute abnormality. Skull: No evidence of calvarial fracture. Small to moderate right frontal scalp contusion and laceration. Sinuses/Orbits: No fluid levels. Symmetric mucoperiosteal thickening in the maxillary sinuses. Other:  The mastoid air cells are unopacified. CT CERVICAL SPINE FINDINGS Alignment: Mild straightening of the cervical spine. No facet subluxation. Dens is well positioned between the lateral masses of C1. Skull base and vertebrae: No acute fracture. No primary bone  lesion or focal pathologic process. Soft tissues and spinal canal: No prevertebral edema. No visible canal hematoma. Disc levels: Moderate multilevel cervical degenerative disc disease, most prominent at C6-7. Moderate bilateral facet arthropathy. No significant degenerative foraminal stenosis. Upper chest: No acute abnormality. Other: Visualized mastoid air cells appear clear. Hypodense 1.4 cm left thyroid nodule. Not clinically significant; no follow-up imaging recommended (ref: J Am Coll Radiol. 2015 Feb;12(2): 143-50). No pathologically enlarged cervical nodes. IMPRESSION: 1. Small to moderate right frontal scalp contusion and laceration. 2. No evidence of acute intracranial abnormality. No evidence of calvarial fracture. 3. Chronic appearing bilateral maxillary sinusitis. 4. No cervical spine fracture or subluxation. 5. Moderate multilevel degenerative changes in the cervical spine as detailed. Electronically Signed   By: Delbert Phenix M.D.   On: 07/24/2020 08:52   DG  Knee Complete 4 Views Right  Result Date: 07/24/2020 CLINICAL DATA:  Fall, right knee pain EXAM: RIGHT KNEE - COMPLETE 4+ VIEW COMPARISON:  None. FINDINGS: Status post right total knee arthroplasty, with no evidence hardware fracture or loosening. No bone fracture, dislocation or joint effusion. No suspicious focal osseous lesions. IMPRESSION: No fracture, dislocation or joint effusion in the right knee. 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 Electronically Signed   By: Delbert PhenixJason A Poff M.D.   On: 07/24/2020 08:19    Procedures .Marland Kitchen.Laceration Repair  Date/Time: 07/24/2020 4:53 PM Performed by: Farrel GordonPatel, Chantella Creech, PA-C Authorized by: Farrel GordonPatel, Jaileigh Weimer, PA-C   Consent:    Consent obtained:  Verbal   Consent given by:  Patient   Risks discussed:  Infection, need for additional repair, pain, poor cosmetic result and poor wound healing   Alternatives discussed:  No treatment and delayed treatment Universal protocol:     Procedure explained and questions answered to patient or proxy's satisfaction: yes     Relevant documents present and verified: yes     Test results available: yes     Imaging studies available: yes     Required blood products, implants, devices, and special equipment available: yes     Site/side marked: yes     Immediately prior to procedure, a time out was called: yes     Patient identity confirmed:  Verbally with patient Anesthesia:    Anesthesia method:  Local infiltration   Local anesthetic:  Lidocaine 1% w/o epi Laceration details:    Location:  Face   Face location:  Forehead   Length (cm):  16   Depth (mm):  3 Pre-procedure details:    Preparation:  Patient was prepped and draped in usual sterile fashion and imaging obtained to evaluate for foreign bodies Exploration:    Hemostasis achieved with:  Direct pressure   Imaging outcome: foreign body not noted     Contaminated: no   Treatment:    Area cleansed with:  Povidone-iodine, saline and chlorhexidine   Amount of cleaning:  Extensive   Irrigation solution:  Sterile saline   Irrigation volume:  1000   Irrigation method:  Syringe   Visualized foreign bodies/material removed: no     Debridement:  None   Undermining:  None Skin repair:    Repair method:  Sutures   Suture size:  5-0   Suture material:  Prolene   Suture technique:  Simple interrupted   Number of sutures:  20 Approximation:    Approximation:  Close Repair type:    Repair type:  Simple Post-procedure details:    Dressing:  Open (no dressing)   Procedure completion:  Tolerated well, no immediate complications   Medications Ordered in ED Medications  acetaminophen (TYLENOL) tablet 1,000 mg (1,000 mg Oral Given 07/24/20 1625)  lidocaine (PF) (XYLOCAINE) 1 % injection 10 mL (10 mLs Infiltration Given 07/24/20 1620)    ED Course  I have reviewed the triage vital signs and the nursing notes.  Pertinent labs & imaging results that were available  during my care of the patient were reviewed by me and considered in my medical decision making (see chart for details).    MDM Rules/Calculators/A&P                          80 year-old female who presents to the emergency department today for mechanical fall and head laceration.  Patient without any obvious trauma besides to face,  CT imaging obtained without any acute intracranial abnormality.  Xrays without any acute fractures. Was able to suture laceration, with extensive irrigation, please see laceration note.  Normal range of motion to all extremities with good strength, patient is ambulatory, normal neuro exam, patient appears very well, unfortunately has been waiting in the waiting room for 9 hours until is able to see her and she is stable.  Patient be discharged at this time.  Patient is ambulatory with walker.  Symptomatic treatment discussed, patient will follow up with PCP in the next couple of days.  Blood pressure elevated today, most likely because patient did not take blood pressure medication, has been here for 10 hours now.  Discussed options of taking it now or going home, he states he will take it when they get home.  Doubt need for further emergent work up at this time. I explained the diagnosis and have given explicit precautions to return to the ER including for any other new or worsening symptoms. The patient understands and accepts the medical plan as it's been dictated and I have answered their questions. Discharge instructions concerning home care and prescriptions have been given. The patient is STABLE and is discharged to home in good condition.  I discussed this case with my attending physician who cosigned this note including patient's presenting symptoms, physical exam, and planned diagnostics and interventions. Attending physician stated agreement with plan or made changes to plan which were implemented.   Attending physician assessed patient at bedside.  Final Clinical  Impression(s) / ED Diagnoses Final diagnoses:  Fall, initial encounter  Facial laceration, initial encounter    Rx / DC Orders ED Discharge Orders     None        Farrel Gordon, PA-C 07/24/20 1714    Tilden Fossa, MD 07/26/20 2321

## 2020-07-24 NOTE — Discharge Instructions (Addendum)
Please use the attached laceration care guidelines in addition to the fall prevention at home.  Please follow-up with your primary care in the next couple of days.      You were evaluated in the Emergency Department and after careful evaluation, we did not find any emergent condition requiring admission or further testing in the hospital.     Please return to the Emergency Department if you experience any worsening of your condition.  Thank you for allowing [place] to be a part of your care. Please speak to your pharmacist about any new medications prescribed today in regards to side effects or interactions with other medications.  Your blood pressure was also elevated today, most likely because you did not take your blood pressure medication, please follow-up with your PCP for this.     WOUND CARE Please return in 7 days to have your stitches/staples removed or sooner if you have concerns.  Keep area clean and dry for 24 hours. Do not remove bandage, if applied.  After 24 hours, remove bandage and wash wound gently with mild soap and warm water. Reapply a new bandage after cleaning wound, if directed.  Continue daily cleansing with soap and water until stitches/staples are removed.  Do not apply any ointments or creams to the wound while stitches/staples are in place, as this may cause delayed healing.  Notify the office if you experience any of the following signs of infection: Swelling, redness, pus drainage, streaking, fever >101.0 F  Notify the office if you experience excessive bleeding that does not stop after 15-20 minutes of constant, firm pressure.

## 2020-11-04 ENCOUNTER — Other Ambulatory Visit: Payer: Self-pay | Admitting: Nephrology

## 2020-11-04 DIAGNOSIS — N183 Chronic kidney disease, stage 3 unspecified: Secondary | ICD-10-CM

## 2020-11-17 DIAGNOSIS — R440 Auditory hallucinations: Secondary | ICD-10-CM | POA: Insufficient documentation

## 2020-11-17 HISTORY — DX: Auditory hallucinations: R44.0

## 2020-11-22 ENCOUNTER — Other Ambulatory Visit: Payer: Medicare Other

## 2020-12-14 ENCOUNTER — Other Ambulatory Visit: Payer: Medicare Other

## 2020-12-15 ENCOUNTER — Emergency Department (HOSPITAL_COMMUNITY): Payer: Medicare Other

## 2020-12-15 ENCOUNTER — Emergency Department (HOSPITAL_COMMUNITY)
Admission: EM | Admit: 2020-12-15 | Discharge: 2020-12-15 | Disposition: A | Discharge status: Home / Self Care | Payer: Medicare Other | Attending: Emergency Medicine | Admitting: Emergency Medicine

## 2020-12-15 ENCOUNTER — Other Ambulatory Visit: Payer: Self-pay

## 2020-12-15 DIAGNOSIS — R42 Dizziness and giddiness: Secondary | ICD-10-CM | POA: Insufficient documentation

## 2020-12-15 DIAGNOSIS — I10 Essential (primary) hypertension: Secondary | ICD-10-CM | POA: Insufficient documentation

## 2020-12-15 LAB — URINALYSIS, ROUTINE W REFLEX MICROSCOPIC
Bilirubin Urine: NEGATIVE
Glucose, UA: NEGATIVE mg/dL
Hgb urine dipstick: NEGATIVE
Ketones, ur: NEGATIVE mg/dL
Nitrite: NEGATIVE
Protein, ur: NEGATIVE mg/dL
Specific Gravity, Urine: 1.01 (ref 1.005–1.030)
pH: 6 (ref 5.0–8.0)

## 2020-12-15 LAB — CBC WITH DIFFERENTIAL/PLATELET
Abs Immature Granulocytes: 0.05 10*3/uL (ref 0.00–0.07)
Basophils Absolute: 0 10*3/uL (ref 0.0–0.1)
Basophils Relative: 0 %
Eosinophils Absolute: 0.1 10*3/uL (ref 0.0–0.5)
Eosinophils Relative: 2 %
HCT: 43.8 % (ref 36.0–46.0)
Hemoglobin: 13.8 g/dL (ref 12.0–15.0)
Immature Granulocytes: 1 %
Lymphocytes Relative: 28 %
Lymphs Abs: 1.4 10*3/uL (ref 0.7–4.0)
MCH: 28.8 pg (ref 26.0–34.0)
MCHC: 31.5 g/dL (ref 30.0–36.0)
MCV: 91.4 fL (ref 80.0–100.0)
Monocytes Absolute: 0.4 10*3/uL (ref 0.1–1.0)
Monocytes Relative: 8 %
Neutro Abs: 3.1 10*3/uL (ref 1.7–7.7)
Neutrophils Relative %: 61 %
Platelets: 157 10*3/uL (ref 150–400)
RBC: 4.79 MIL/uL (ref 3.87–5.11)
RDW: 16.7 % — ABNORMAL HIGH (ref 11.5–15.5)
WBC: 5 10*3/uL (ref 4.0–10.5)
nRBC: 0.4 % — ABNORMAL HIGH (ref 0.0–0.2)

## 2020-12-15 LAB — COMPREHENSIVE METABOLIC PANEL
ALT: 9 U/L (ref 0–44)
AST: 16 U/L (ref 15–41)
Albumin: 4.1 g/dL (ref 3.5–5.0)
Alkaline Phosphatase: 58 U/L (ref 38–126)
Anion gap: 8 (ref 5–15)
BUN: 32 mg/dL — ABNORMAL HIGH (ref 8–23)
CO2: 27 mmol/L (ref 22–32)
Calcium: 9.1 mg/dL (ref 8.9–10.3)
Chloride: 105 mmol/L (ref 98–111)
Creatinine, Ser: 2.09 mg/dL — ABNORMAL HIGH (ref 0.44–1.00)
GFR, Estimated: 24 mL/min — ABNORMAL LOW (ref >60)
Glucose, Bld: 99 mg/dL (ref 70–99)
Potassium: 4.4 mmol/L (ref 3.5–5.1)
Sodium: 140 mmol/L (ref 135–145)
Total Bilirubin: 0.8 mg/dL (ref 0.3–1.2)
Total Protein: 7.2 g/dL (ref 6.5–8.1)

## 2020-12-15 LAB — URINALYSIS, MICROSCOPIC (REFLEX)

## 2020-12-15 LAB — TROPONIN I (HIGH SENSITIVITY)
Troponin I (High Sensitivity): 8 ng/L (ref <18)
Troponin I (High Sensitivity): 7 ng/L (ref <18)

## 2020-12-15 LAB — MAGNESIUM: Magnesium: 2.3 mg/dL (ref 1.7–2.4)

## 2020-12-15 LAB — LIPASE, BLOOD: Lipase: 36 U/L (ref 11–51)

## 2020-12-15 MED ORDER — MECLIZINE HCL 12.5 MG PO TABS: 12.5000 mg | ORAL_TABLET | Freq: Three times a day (TID) | ORAL | 0 refills | Status: DC | PRN

## 2020-12-15 MED ORDER — MECLIZINE HCL 25 MG PO TABS
12.5000 mg | ORAL_TABLET | Freq: Once | ORAL | Status: AC
Start: 2020-12-15 — End: 2020-12-15
  Administered 2020-12-15: 22:00:00 12.5 mg via ORAL
  Filled 2020-12-15: qty 1

## 2020-12-15 NOTE — ED Provider Notes (Signed)
[facility] EMERGENCY DEPARTMENT Provider Note   CSN: [id number] Arrival date & time: 12/15/20  1149     History Chief Complaint  Patient presents with   Dizziness    Sharon Ramos is a 80 y.o. female.  HPI Patient's been having symptoms of dizziness for 1 to 2 weeks.  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  Patient's daughter acts as Nurse, learning disability.  She perceives a vibration sensation but also endorses a quality of spinning.  Symptoms have been most pronounced at night.  However, she is experiencing during the day as well.  It has not caused any falls, there is no associated focal weakness numbness or tingling.  No confusion and no headache.  She has otherwise been active and at baseline.  He denies any known history of vertigo.  Patient does notably have some tremor.  Her daughter reports has been present for a long time but she does not have a diagnosis of Parkinson's.    Past Medical History:  Diagnosis Date   Hypertension     There are no problems to display for this patient.   Past Surgical History:  Procedure Laterality Date   KNEE SURGERY       OB History   No obstetric history on file.     No family history on file.  Social History   Tobacco Use   Smoking status: Never   Smokeless tobacco: Never  Substance Use Topics   Alcohol use: Yes    Alcohol/week: 21.0 standard drinks    Types: 21 Cans of beer per week   Drug use: No    Home Medications Prior to Admission medications   Medication Sig Start Date End Date Taking? Authorizing Provider  meclizine (ANTIVERT) 12.5 MG tablet Take 1 tablet (12.5 mg total) by mouth 3 (three) times daily as needed for dizziness. 12/15/20  Yes Arby Barrette, MD    Allergies    Patient has no known allergies.  Review of Systems   Review of Systems 10 systems reviewed and negative except as per HPI Physical Exam Updated Vital Signs BP 134/70 (BP Location: Right Arm)    Pulse (!) 56    Temp 97.6 F (36.4  C) (Oral)    Resp 17    SpO2 100%   Physical Exam Constitutional:      Appearance: Normal appearance.  HENT:     Head: Normocephalic and atraumatic.     Nose: Nose normal.     Mouth/Throat:     Mouth: Mucous membranes are moist.     Pharynx: Oropharynx is clear.  Eyes:     Extraocular Movements: Extraocular movements intact.     Conjunctiva/sclera: Conjunctivae normal.  Cardiovascular:     Rate and Rhythm: Normal rate and regular rhythm.  Pulmonary:     Effort: Pulmonary effort is normal.     Breath sounds: Normal breath sounds.  Abdominal:     General: There is no distension.     Palpations: Abdomen is soft.     Tenderness: There is no abdominal tenderness. There is no guarding.  Musculoskeletal:        General: No swelling or tenderness. Normal range of motion.     Cervical back: Neck supple.  Skin:    General: Skin is warm and dry.  Neurological:     General: No focal deficit present.     Mental Status: She is alert and oriented to person, place, and time.     Motor: No weakness.  Coordination: Coordination normal.     Comments: Patient does have some baseline tremor of both upper extremities.  However she can perform finger-nose exam bilaterally.  He follows commands for grip strength testing.  Patient is alert with any signs of somnolence or aphasia.  Psychiatric:        Mood and Affect: Mood normal.    ED Results / Procedures / Treatments   Labs (all labs ordered are listed, but only abnormal results are displayed) Labs Reviewed  COMPREHENSIVE METABOLIC PANEL - Abnormal; Notable for the following components:      Result Value   BUN 32 (*)    Creatinine, Ser 2.09 (*)    GFR, Estimated 24 (*)    All other components within normal limits  CBC WITH DIFFERENTIAL/PLATELET - Abnormal; Notable for the following components:   RDW 16.7 (*)    nRBC 0.4 (*)    All other components within normal limits  URINALYSIS, ROUTINE W REFLEX MICROSCOPIC - Abnormal; Notable for  the following components:   Leukocytes,Ua TRACE (*)    All other components within normal limits  URINALYSIS, MICROSCOPIC (REFLEX) - Abnormal; Notable for the following components:   Bacteria, UA RARE (*)    All other components within normal limits  MAGNESIUM  LIPASE, BLOOD  TROPONIN I (HIGH SENSITIVITY)  TROPONIN I (HIGH SENSITIVITY)    EKG EKG Interpretation  Date/Time:  Wednesday December 15 2020 13:16:16 EST Ventricular Rate:  64 PR Interval:    QRS Duration: 108 QT Interval:  386 QTC Calculation: 398 R Axis:   -63 Text Interpretation: artifact vs flutter. no acute ischemic appearance. Confirmed by Arby Barrette [phone]) on 12/15/2020 7:55:55 PM  Radiology CT Head Wo Contrast  Result Date: 12/15/2020 CLINICAL DATA:  Dizziness, weakness. EXAM: CT HEAD WITHOUT CONTRAST TECHNIQUE: Contiguous axial images were obtained from the base of the skull through the vertex without intravenous contrast. COMPARISON:  July 24, 2020. FINDINGS: Brain: No evidence of acute infarction, hemorrhage, hydrocephalus, extra-axial collection or mass lesion/mass effect. Vascular: No hyperdense vessel or unexpected calcification. Skull: Normal. Negative for fracture or focal lesion. Sinuses/Orbits: No acute finding. Other: None. IMPRESSION: No acute intracranial abnormality seen. Electronically Signed   By: Lupita Raider M.D.   On: 12/15/2020 21:19    Procedures Procedures   Medications Ordered in ED Medications - No data to display  ED Course  I have reviewed the triage vital signs and the nursing notes.  Pertinent labs & imaging results that were available during my care of the patient were reviewed by me and considered in my medical decision making (see chart for details).    MDM Rules/Calculators/A&P                           Patient has had up to several weeks of symptoms that have a vertiginous quality.  He does endorse a spinning quality and also describes some vibratory sensation.   She has no focal deficits.  Patient is not ill in appearance and does not have positive review of systems it was suggest infectious etiology.  Urinalysis is normal.  CT head does not show any acute findings.  At this time, with symptoms present for 1 to 2 weeks and intermittent, plan will be for symptomatic treatment with Antivert and recommended follow-up with PCP for recheck.  Return precautions have been reviewed. Final Clinical Impression(s) / ED Diagnoses Final diagnoses:  Vertigo    Rx / DC Orders ED  Discharge Orders          Ordered    meclizine (ANTIVERT) 12.5 MG tablet  3 times daily PRN        12/15/20 2127             Arby Barrette, MD 12/15/20 2134

## 2020-12-15 NOTE — Discharge Instructions (Signed)
1.  You may try meclizine as prescribed for dizziness.  If the medication is helpful use it.  If you find the medication does not help her symptoms or make you feel drowsy, you do not have to take the medication.  The medication is to help with symptoms but does not cure the problem. 2.  At this time, I have low suspicion that you have had a stroke.  However, if you develop sudden worsening symptoms, problems with your vision such as double vision or loss of vision, weakness numbness or tingling of your arms or your legs, confusion, return to emergency department immediately for recheck.

## 2020-12-15 NOTE — ED Provider Notes (Signed)
Emergency Medicine Provider Triage Evaluation Note  Sharon Ramos , a 80 y.o. female  was evaluated in triage.  Pt complains of feeling dizzy and weak for about 2 weeks.  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 She has been getting Dramamine every night as she feels like things are moving mostly at night however is through the entire day.  She has been very tremulous.  She does have a history of CKD.  No headaches.  Review of Systems  Positive: See above Negative:   Physical Exam  BP (!) 139/92 (BP Location: Right Arm)    Pulse 64    Temp 97.8 F (36.6 C) (Oral)    Resp 16    SpO2 99%  Gen:   Awake, tremulous Resp:  Normal effort  MSK:   Moves extremities without difficulty tremor noted in bilateral Arms Other:  Patient is deaf, and interactions performed through professional ASL video interpreter.  Medical Decision Making  Medically screening exam initiated at 1:31 PM.  Appropriate orders placed.  MIQUEL LAMSON was informed that the remainder of the evaluation will be completed by another provider, this initial triage assessment does not replace that evaluation, and the importance of remaining in the ED until their evaluation is complete.  Patient with weakness, tremor and dizziness over two to three weeks.  History of CKD.  Will check labs.     Cristina Gong, PA-C 12/15/20 1335    Wynetta Fines, MD 12/20/20 [phone]

## 2020-12-15 NOTE — ED Triage Notes (Signed)
Pt accompanied by daughter with whom she lives. Pt is deaf and uses ASL interpreter. Patient reports feeling dizzy and weak for about two weeks. Pt's daughter also adds that patient sometimes hears loud noises that others do not hear, that she feels like the floor and other objects are shaking even though they are not, and that she is profoundly weak.

## 2021-02-04 ENCOUNTER — Ambulatory Visit
Admission: RE | Admit: 2021-02-04 | Discharge: 2021-02-04 | Disposition: A | Discharge status: Home / Self Care | Payer: Medicare Other | Source: Ambulatory Visit | Attending: Nephrology | Admitting: Nephrology

## 2021-02-04 DIAGNOSIS — N183 Chronic kidney disease, stage 3 unspecified: Secondary | ICD-10-CM

## 2021-03-11 ENCOUNTER — Encounter: Payer: Self-pay | Admitting: Physician Assistant

## 2021-03-22 ENCOUNTER — Ambulatory Visit: Payer: Medicare Other | Admitting: Physician Assistant

## 2021-04-11 ENCOUNTER — Other Ambulatory Visit (INDEPENDENT_AMBULATORY_CARE_PROVIDER_SITE_OTHER): Payer: Medicare Other

## 2021-04-11 ENCOUNTER — Encounter: Payer: Self-pay | Admitting: Physician Assistant

## 2021-04-11 ENCOUNTER — Ambulatory Visit: Payer: Medicare Other | Admitting: Physician Assistant

## 2021-04-11 VITALS — BP 139/81 | HR 69 | Resp 18 | Ht 69.0 in | Wt 240.0 lb

## 2021-04-11 DIAGNOSIS — G3184 Mild cognitive impairment, so stated: Secondary | ICD-10-CM

## 2021-04-11 DIAGNOSIS — R413 Other amnesia: Secondary | ICD-10-CM | POA: Diagnosis not present

## 2021-04-11 MED ORDER — DONEPEZIL HCL 10 MG PO TABS: ORAL_TABLET | ORAL | 3 refills | Status: DC
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
## 2021-04-11 NOTE — Patient Instructions (Addendum)
It was a pleasure to see you today at our office.  ? ?Recommendations: ?MRI of the brain, the radiology office will call you to arrange you appointment ?Check labs today ?Follow up May 11 at 11:30  ?We will start donepezil half tablet (5mg ) daily for 2  weeks.  If you are tolerating the medication, then after 2 weeks, we will increase the dose to a full tablet of 10 mg daily.  Side effects include nausea, vomiting, diarrhea, vivid dreams, and muscle cramps.  Please call the clinic if you experience any of these symptoms.  ? ?RECOMMENDATIONS FOR ALL PATIENTS WITH MEMORY PROBLEMS: ?1. Continue to exercise (Recommend 30 minutes of walking everyday, or 3 hours every week) ?2. Increase social interactions - continue going to [place] and enjoy social gatherings with friends and family ?3. Eat healthy, avoid fried foods and eat more fruits and vegetables ?4. Maintain adequate blood pressure, blood sugar, and blood cholesterol level. Reducing the risk of stroke and cardiovascular disease also helps promoting better memory. ?5. Avoid stressful situations. Live a simple life and avoid aggravations. Organize your time and prepare for the next day in anticipation. ?6. Sleep well, avoid any interruptions of sleep and avoid any distractions in the bedroom that may interfere with adequate sleep quality ?7. Avoid sugar, avoid sweets as there is a strong link between excessive sugar intake, diabetes, and cognitive impairment ?We discussed the Mediterranean diet, which has been shown to help patients reduce the risk of progressive memory disorders and reduces cardiovascular risk. This includes eating fish, eat fruits and green leafy vegetables, nuts like almonds and hazelnuts, walnuts, and also use olive oil. Avoid fast foods and fried foods as much as possible. Avoid sweets and sugar as sugar use has been linked to worsening of memory function. ? ?There is always a concern of gradual progression of memory problems. If this is the  case, then we may need to adjust level of care according to patient needs. Support, both to the patient and caregiver, should then be put into place.  ? ? ? ? ?You have been referred for a neuropsychological evaluation (i.e., evaluation of memory and thinking abilities). Please bring someone with you to this appointment if possible, as it is helpful for the doctor to hear from both you and another adult who knows you well. Please bring eyeglasses and hearing aids if you wear them.  ?  ?The evaluation will take approximately 3 hours and has two parts: ?  ?The first part is a clinical interview with the neuropsychologist (Dr. Caspe or Dr. Milbert Coulter). During the interview, the neuropsychologist will speak with you and the individual you brought to the appointment.  ?  ?The second part of the evaluation is testing with the doctor's technician Roseanne Reno or Annabelle Harman). During the testing, the technician will ask you to remember different types of material, solve problems, and answer some questionnaires. Your family member will not be present for this portion of the evaluation. ?  ?Please note: We must reserve several hours of the neuropsychologist's time and the psychometrician's time for your evaluation appointment. As such, there is a No-Show fee of $100. If you are unable to attend any of your appointments, please contact our office as soon as possible to reschedule.  ? ? ?FALL PRECAUTIONS: Be cautious when walking. Scan the area for obstacles that may increase the risk of trips and falls. When getting up in the mornings, sit up at the edge of the bed for a few minutes  before getting out of bed. Consider elevating the bed at the head end to avoid drop of blood pressure when getting up. Walk always in a well-lit room (use night lights in the walls). Avoid area rugs or power cords from appliances in the middle of the walkways. Use a walker or a cane if necessary and consider physical therapy for balance exercise. Get your eyesight  checked regularly. ? ?FINANCIAL OVERSIGHT: Supervision, especially oversight when making financial decisions or transactions is also recommended. ? ?HOME SAFETY: Consider the safety of the kitchen when operating appliances like stoves, microwave oven, and blender. Consider having supervision and share cooking responsibilities until no longer able to participate in those. Accidents with firearms and other hazards in the house should be identified and addressed as well. ? ? ?ABILITY TO BE LEFT ALONE: If patient is unable to contact 911 operator, consider using [employer], or when the need is there, arrange for someone to stay with patients. Smoking is a fire hazard, consider supervision or cessation. Risk of wandering should be assessed by caregiver and if detected at any point, supervision and safe proof recommendations should be instituted. ? ?MEDICATION SUPERVISION: Inability to self-administer medication needs to be constantly addressed. Implement a mechanism to ensure safe administration of the medications. ? ? ?DRIVING: Regarding driving, in patients with progressive memory problems, driving will be impaired. We advise to have someone else do the driving if trouble finding directions or if minor accidents are reported. Independent driving assessment is available to determine safety of driving. ? ? ?If you are interested in the driving assessment, you can contact the following: ? ?[facility] in [place] [phone] ? ?[facility] [phone] ? ?[facility] [phone] ? ?[facility] [phone] or [phone] ? ? ? ?Mediterranean Diet ?A Mediterranean diet refers to food and lifestyle choices that are based on the traditions of countries located on the [employer]. This way of eating has been shown to help prevent certain conditions and improve outcomes for people who have chronic diseases, like kidney disease and heart disease. ?What are tips for  following this plan? ?Lifestyle  ?Cook and eat meals together with your family, when possible. ?Drink enough fluid to keep your urine clear or pale yellow. ?Be physically active every day. This includes: ?Aerobic exercise like running or swimming. ?Leisure activities like gardening, walking, or housework. ?Get 7-8 hours of sleep each night. ?If recommended by your health care provider, drink red wine in moderation. This means 1 glass a day for nonpregnant women and 2 glasses a day for men. A glass of wine equals 5 oz (150 mL). ?Reading food labels  ?Check the serving size of packaged foods. For foods such as rice and pasta, the serving size refers to the amount of cooked product, not dry. ?Check the total fat in packaged foods. Avoid foods that have saturated fat or trans fats. ?Check the ingredients list for added sugars, such as corn syrup. ?Shopping  ?At the grocery store, buy most of your food from the areas near the walls of the store. This includes: ?Fresh fruits and vegetables (produce). ?Grains, beans, nuts, and seeds. Some of these may be available in unpackaged forms or large amounts (in bulk). ?Fresh seafood. ?Poultry and eggs. ?Low-fat dairy products. ?Buy whole ingredients instead of prepackaged foods. ?Buy fresh fruits and vegetables in-season from local farmers markets. ?Buy frozen fruits and vegetables in resealable bags. ?If you do not have access to quality fresh seafood, buy precooked frozen shrimp or  canned fish, such as tuna, salmon, or sardines. ?Buy small amounts of raw or cooked vegetables, salads, or olives from the deli or salad bar at your store. ?Stock your pantry so you always have certain foods on hand, such as olive oil, canned tuna, canned tomatoes, rice, pasta, and beans. ?Cooking  ?Cook foods with extra-virgin olive oil instead of using butter or other vegetable oils. ?Have meat as a side dish, and have vegetables or grains as your main dish. This means having meat in small portions  or adding small amounts of meat to foods like pasta or stew. ?Use beans or vegetables instead of meat in common dishes like chili or lasagna. ?Experiment with different cooking methods. Try roasting or broiling

## 2021-04-12 LAB — TSH: TSH: 1.67 u[IU]/mL (ref 0.35–5.50)

## 2021-04-12 NOTE — Progress Notes (Signed)
Pl inform patient thyroid levels are normal, thanks

## 2021-04-18 DIAGNOSIS — G301 Alzheimer's disease with late onset: Secondary | ICD-10-CM | POA: Diagnosis present

## 2021-04-27 ENCOUNTER — Ambulatory Visit
Admission: RE | Admit: 2021-04-27 | Discharge: 2021-04-27 | Disposition: A | Discharge status: Home / Self Care | Payer: Medicare Other | Source: Ambulatory Visit | Attending: Physician Assistant | Admitting: Physician Assistant

## 2021-04-28 NOTE — Progress Notes (Signed)
Please, inform patient that the MRI results show mild chronic aging changes in the vessels No stroke, masses, fluid or infection is seen. Thank you!

## 2021-05-12 ENCOUNTER — Encounter: Payer: Self-pay | Admitting: Physician Assistant

## 2021-05-12 ENCOUNTER — Ambulatory Visit: Payer: Medicare Other | Admitting: Physician Assistant

## 2021-05-12 VITALS — Resp 18 | Ht 69.0 in | Wt 238.0 lb

## 2021-05-12 DIAGNOSIS — G3184 Mild cognitive impairment, so stated: Secondary | ICD-10-CM | POA: Diagnosis not present

## 2021-05-12 MED ORDER — MEMANTINE HCL 5 MG PO TABS: ORAL_TABLET | ORAL | 1 refills | Status: DC

## 2021-05-12 NOTE — Patient Instructions (Signed)
It was a pleasure to see you today at our office.  ? ?Recommendations: ? ?Follow up Nov 9 at 11:30  ? Start Memantine 5mg  tablets.  Take 1 tablet at bedtime for 2 weeks, then 1 tablet twice daily.   Side effects include dizziness, headache, diarrhea or constipation.  Call if severe symptoms appear  ? ?RECOMMENDATIONS FOR ALL PATIENTS WITH MEMORY PROBLEMS: ?1. Continue to exercise (Recommend 30 minutes of walking everyday, or 3 hours every week) ?2. Increase social interactions - continue going to [place] and enjoy social gatherings with friends and family ?3. Eat healthy, avoid fried foods and eat more fruits and vegetables ?4. Maintain adequate blood pressure, blood sugar, and blood cholesterol level. Reducing the risk of stroke and cardiovascular disease also helps promoting better memory. ?5. Avoid stressful situations. Live a simple life and avoid aggravations. Organize your time and prepare for the next day in anticipation. ?6. Sleep well, avoid any interruptions of sleep and avoid any distractions in the bedroom that may interfere with adequate sleep quality ?7. Avoid sugar, avoid sweets as there is a strong link between excessive sugar intake, diabetes, and cognitive impairment ?We discussed the Mediterranean diet, which has been shown to help patients reduce the risk of progressive memory disorders and reduces cardiovascular risk. This includes eating fish, eat fruits and green leafy vegetables, nuts like almonds and hazelnuts, walnuts, and also use olive oil. Avoid fast foods and fried foods as much as possible. Avoid sweets and sugar as sugar use has been linked to worsening of memory function. ? ?There is always a concern of gradual progression of memory problems. If this is the case, then we may need to adjust level of care according to patient needs. Support, both to the patient and caregiver, should then be put into place.  ? ? ? ? ?You have been referred for a neuropsychological evaluation (i.e.,  evaluation of memory and thinking abilities). Please bring someone with you to this appointment if possible, as it is helpful for the doctor to hear from both you and another adult who knows you well. Please bring eyeglasses and hearing aids if you wear them.  ?  ?The evaluation will take approximately 3 hours and has two parts: ?  ?The first part is a clinical interview with the neuropsychologist (Dr. Caspe or Dr. Milbert Coulter). During the interview, the neuropsychologist will speak with you and the individual you brought to the appointment.  ?  ?The second part of the evaluation is testing with the doctor's technician Roseanne Reno or Annabelle Harman). During the testing, the technician will ask you to remember different types of material, solve problems, and answer some questionnaires. Your family member will not be present for this portion of the evaluation. ?  ?Please note: We must reserve several hours of the neuropsychologist's time and the psychometrician's time for your evaluation appointment. As such, there is a No-Show fee of $100. If you are unable to attend any of your appointments, please contact our office as soon as possible to reschedule.  ? ? ?FALL PRECAUTIONS: Be cautious when walking. Scan the area for obstacles that may increase the risk of trips and falls. When getting up in the mornings, sit up at the edge of the bed for a few minutes before getting out of bed. Consider elevating the bed at the head end to avoid drop of blood pressure when getting up. Walk always in a well-lit room (use night lights in the walls). Avoid area rugs or power cords from  appliances in the middle of the walkways. Use a walker or a cane if necessary and consider physical therapy for balance exercise. Get your eyesight checked regularly. ? ?FINANCIAL OVERSIGHT: Supervision, especially oversight when making financial decisions or transactions is also recommended. ? ?HOME SAFETY: Consider the safety of the kitchen when operating appliances like  stoves, microwave oven, and blender. Consider having supervision and share cooking responsibilities until no longer able to participate in those. Accidents with firearms and other hazards in the house should be identified and addressed as well. ? ? ?ABILITY TO BE LEFT ALONE: If patient is unable to contact 911 operator, consider using Life[facility], or when the need is there, arrange for someone to stay with patients. Smoking is a fire hazard, consider supervision or cessation. Risk of wandering should be assessed by caregiver and if detected at any point, supervision and safe proof recommendations should be instituted. ? ?MEDICATION SUPERVISION: Inability to self-administer medication needs to be constantly addressed. Implement a mechanism to ensure safe administration of the medications. ? ? ?DRIVING: Regarding driving, in patients with progressive memory problems, driving will be impaired. We advise to have someone else do the driving if trouble finding directions or if minor accidents are reported. Independent driving assessment is available to determine safety of driving. ? ? ?If you are interested in the driving assessment, you can contact the following: ? ?[employer] [facility] [place] [phone] ? ?[facility] [phone] ? ?[facility] [phone] ? ?[facility] [phone] or [phone] ? ? ? ?Mediterranean Diet ?A Mediterranean diet refers to food and lifestyle choices that are based on the traditions of countries located on the [place]. This way of eating has been shown to help prevent certain conditions and improve outcomes for people who have chronic diseases, like kidney disease and heart disease. ?What are tips for following this plan? ?Lifestyle  ?Cook and eat meals together with your family, when possible. ?Drink enough fluid to keep your urine clear or pale yellow. ?Be physically active every day. This includes: ?Aerobic exercise like running  or swimming. ?Leisure activities like gardening, walking, or housework. ?Get 7-8 hours of sleep each night. ?If recommended by your health care provider, drink red wine in moderation. This means 1 glass a day for nonpregnant women and 2 glasses a day for men. A glass of wine equals 5 oz (150 mL). ?Reading food labels  ?Check the serving size of packaged foods. For foods such as rice and pasta, the serving size refers to the amount of cooked product, not dry. ?Check the total fat in packaged foods. Avoid foods that have saturated fat or trans fats. ?Check the ingredients list for added sugars, such as corn syrup. ?Shopping  ?At the grocery store, buy most of your food from the areas near the walls of the store. This includes: ?Fresh fruits and vegetables (produce). ?Grains, beans, nuts, and seeds. Some of these may be available in unpackaged forms or large amounts (in bulk). ?Fresh seafood. ?Poultry and eggs. ?Low-fat dairy products. ?Buy whole ingredients instead of prepackaged foods. ?Buy fresh fruits and vegetables in-season from local farmers markets. ?Buy frozen fruits and vegetables in resealable bags. ?If you do not have access to quality fresh seafood, buy precooked frozen shrimp or canned fish, such as tuna, salmon, or sardines. ?Buy small amounts of raw or cooked vegetables, salads, or olives from the deli or salad bar at your store. ?Stock your pantry so you always have certain foods on hand, such as  olive oil, canned tuna, canned tomatoes, rice, pasta, and beans. ?Cooking  ?Cook foods with extra-virgin olive oil instead of using butter or other vegetable oils. ?Have meat as a side dish, and have vegetables or grains as your main dish. This means having meat in small portions or adding small amounts of meat to foods like pasta or stew. ?Use beans or vegetables instead of meat in common dishes like chili or lasagna. ?Experiment with different cooking methods. Try roasting or broiling vegetables instead of  steaming or saut?eing them. ?Add frozen vegetables to soups, stews, pasta, or rice. ?Add nuts or seeds for added healthy fat at each meal. You can add these to yogurt, salads, or vegetable dishes. ?Marinate
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
## 2021-08-29 ENCOUNTER — Other Ambulatory Visit: Payer: Self-pay | Admitting: Physician Assistant

## 2021-10-07 ENCOUNTER — Other Ambulatory Visit: Payer: Self-pay | Admitting: Physician Assistant

## 2021-11-10 ENCOUNTER — Encounter: Payer: Self-pay | Admitting: Physician Assistant

## 2021-11-10 ENCOUNTER — Ambulatory Visit: Payer: Medicare Other | Admitting: Physician Assistant

## 2021-11-10 VITALS — BP 116/64 | HR 76 | Ht 69.0 in | Wt 231.0 lb

## 2021-11-10 DIAGNOSIS — G3184 Mild cognitive impairment, so stated: Secondary | ICD-10-CM | POA: Diagnosis not present
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
## 2021-11-10 NOTE — Patient Instructions (Signed)
It was a pleasure to see you today at our office.   Recommendations:  Follow up April 29  at 11:30  Continue  Memantine 5mg  tablets twice daily.    [facility] with [facility]  RECOMMENDATIONS FOR ALL PATIENTS WITH MEMORY PROBLEMS: 1. Continue to exercise (Recommend 30 minutes of walking everyday, or 3 hours every week) 2. Increase social interactions - continue going to [place] and enjoy social gatherings with friends and family 3. Eat healthy, avoid fried foods and eat more fruits and vegetables 4. Maintain adequate blood pressure, blood sugar, and blood cholesterol level. Reducing the risk of stroke and cardiovascular disease also helps promoting better memory. 5. Avoid stressful situations. Live a simple life and avoid aggravations. Organize your time and prepare for the next day in anticipation. 6. Sleep well, avoid any interruptions of sleep and avoid any distractions in the bedroom that may interfere with adequate sleep quality 7. Avoid sugar, avoid sweets as there is a strong link between excessive sugar intake, diabetes, and cognitive impairment We discussed the Mediterranean diet, which has been shown to help patients reduce the risk of progressive memory disorders and reduces cardiovascular risk. This includes eating fish, eat fruits and green leafy vegetables, nuts like almonds and hazelnuts, walnuts, and also use olive oil. Avoid fast foods and fried foods as much as possible. Avoid sweets and sugar as sugar use has been linked to worsening of memory function.  There is always a concern of gradual progression of memory problems. If this is the case, then we may need to adjust level of care according to patient needs. Support, both to the patient and caregiver, should then be put into place.      You have been referred for a neuropsychological evaluation (i.e., evaluation of memory and thinking abilities). Please bring someone with you to this appointment if  possible, as it is helpful for the doctor to hear from both you and another adult who knows you well. Please bring eyeglasses and hearing aids if you wear them.    The evaluation will take approximately 3 hours and has two parts:   The first part is a clinical interview with the neuropsychologist (Dr. Caspe or Dr. Milbert Coulter). During the interview, the neuropsychologist will speak with you and the individual you brought to the appointment.    The second part of the evaluation is testing with the doctor's technician Roseanne Reno or Annabelle Harman). During the testing, the technician will ask you to remember different types of material, solve problems, and answer some questionnaires. Your family member will not be present for this portion of the evaluation.   Please note: We must reserve several hours of the neuropsychologist's time and the psychometrician's time for your evaluation appointment. As such, there is a No-Show fee of $100. If you are unable to attend any of your appointments, please contact our office as soon as possible to reschedule.    FALL PRECAUTIONS: Be cautious when walking. Scan the area for obstacles that may increase the risk of trips and falls. When getting up in the mornings, sit up at the edge of the bed for a few minutes before getting out of bed. Consider elevating the bed at the head end to avoid drop of blood pressure when getting up. Walk always in a well-lit room (use night lights in the walls). Avoid area rugs or power cords from appliances in the middle of the walkways. Use a walker or a cane if necessary and consider physical  therapy for balance exercise. Get your eyesight checked regularly.  FINANCIAL OVERSIGHT: Supervision, especially oversight when making financial decisions or transactions is also recommended.  HOME SAFETY: Consider the safety of the kitchen when operating appliances like stoves, microwave oven, and blender. Consider having supervision and share cooking responsibilities  until no longer able to participate in those. Accidents with firearms and other hazards in the house should be identified and addressed as well.   ABILITY TO BE LEFT ALONE: If patient is unable to contact 911 operator, consider using LifeLine, or when the need is there, arrange for someone to stay with patients. Smoking is a fire hazard, consider supervision or cessation. Risk of wandering should be assessed by caregiver and if detected at any point, supervision and safe proof recommendations should be instituted.  MEDICATION SUPERVISION: Inability to self-administer medication needs to be constantly addressed. Implement a mechanism to ensure safe administration of the medications.   DRIVING: Regarding driving, in patients with progressive memory problems, driving will be impaired. We advise to have someone else do the driving if trouble finding directions or if minor accidents are reported. Independent driving assessment is available to determine safety of driving.   If you are interested in the driving assessment, you can contact the following:  [employer] in [place]  [place] [phone] or [phone]    [place] refers to food and lifestyle choices that are based on the traditions of countries located on the [employer]. This way of eating has been shown to help prevent certain conditions and improve outcomes for people who have chronic diseases, like kidney disease and heart disease. What are tips for following this plan? Lifestyle  Cook and eat meals together with your family, when possible. Drink enough fluid to keep your urine clear or pale yellow. Be physically active every day. This includes: Aerobic exercise like running or swimming. Leisure activities like gardening, walking, or housework. Get 7-8 hours of sleep each  night. If recommended by your health care provider, drink red wine in moderation. This means 1 glass a day for nonpregnant women and 2 glasses a day for men. A glass of wine equals 5 oz (150 mL). Reading food labels  Check the serving size of packaged foods. For foods such as rice and pasta, the serving size refers to the amount of cooked product, not dry. Check the total fat in packaged foods. Avoid foods that have saturated fat or trans fats. Check the ingredients list for added sugars, such as corn syrup. Shopping  At the grocery store, buy most of your food from the areas near the walls of the store. This includes: Fresh fruits and vegetables (produce). Grains, beans, nuts, and seeds. Some of these may be available in unpackaged forms or large amounts (in bulk). Fresh seafood. Poultry and eggs. Low-fat dairy products. Buy whole ingredients instead of prepackaged foods. Buy fresh fruits and vegetables in-season from local farmers markets. Buy frozen fruits and vegetables in resealable bags. If you do not have access to quality fresh seafood, buy precooked frozen shrimp or canned fish, such as tuna, salmon, or sardines. Buy small amounts of raw or cooked vegetables, salads, or olives from the deli or salad bar at your store. Stock your pantry so you always have certain foods on hand, such as olive oil, canned tuna, canned tomatoes, rice, pasta, and beans. Cooking  Cook foods with extra-virgin olive oil  instead of using butter or other vegetable oils. Have meat as a side dish, and have vegetables or grains as your main dish. This means having meat in small portions or adding small amounts of meat to foods like pasta or stew. Use beans or vegetables instead of meat in common dishes like chili or lasagna. Experiment with different cooking methods. Try roasting or broiling vegetables instead of steaming or sauteing them. Add frozen vegetables to soups, stews, pasta, or rice. Add nuts or seeds  for added healthy fat at each meal. You can add these to yogurt, salads, or vegetable dishes. Marinate fish or vegetables using olive oil, lemon juice, garlic, and fresh herbs. Meal planning  Plan to eat 1 vegetarian meal one day each week. Try to work up to 2 vegetarian meals, if possible. Eat seafood 2 or more times a week. Have healthy snacks readily available, such as: Vegetable sticks with hummus. Greek yogurt. Fruit and nut trail mix. Eat balanced meals throughout the week. This includes: Fruit: 2-3 servings a day Vegetables: 4-5 servings a day Low-fat dairy: 2 servings a day Fish, poultry, or lean meat: 1 serving a day Beans and legumes: 2 or more servings a week Nuts and seeds: 1-2 servings a day Whole grains: 6-8 servings a day Extra-virgin olive oil: 3-4 servings a day Limit red meat and sweets to only a few servings a month What are my food choices? Mediterranean diet Recommended Grains: Whole-grain pasta. Brown rice. Bulgar wheat. Polenta. Couscous. Whole-wheat bread. [place]. Vegetables: Artichokes. Beets. Broccoli. Cabbage. Carrots. Eggplant. Green beans. Chard. Kale. Spinach. Onions. Leeks. Peas. Squash. Tomatoes. Peppers. Radishes. Fruits: Apples. Apricots. Avocado. Berries. Bananas. Cherries. Dates. Figs. Grapes. Lemons. Melon. Oranges. Peaches. Plums. Pomegranate. Meats and other protein foods: Beans. Almonds. Sunflower seeds. Pine nuts. Peanuts. [place]. Salmon. Scallops. Shrimp. [place]. Tilapia. Clams. Oysters. Eggs. Dairy: Low-fat milk. Cheese. Greek yogurt. Beverages: Water. Red wine. Herbal tea. Fats and oils: Extra virgin olive oil. Avocado oil. Grape seed oil. Sweets and desserts: [place] yogurt with honey. Baked apples. Poached pears. Trail mix. Seasoning and other foods: Basil. Cilantro. Coriander. Cumin. Mint. Parsley. Sage. Rosemary. Tarragon. Garlic. Oregano. Thyme. Pepper. Balsalmic vinegar. Tahini. Hummus. Tomato sauce. Olives. Mushrooms. Limit  these Grains: Prepackaged pasta or rice dishes. Prepackaged cereal with added sugar. Vegetables: Deep fried potatoes (french fries). Fruits: Fruit canned in syrup. Meats and other protein foods: Beef. Pork. Lamb. Poultry with skin. Hot dogs. [place] Salines. Dairy: Ice cream. Sour cream. Whole milk. Beverages: Juice. Sugar-sweetened soft drinks. Beer. Liquor and spirits. Fats and oils: Butter. Canola oil. Vegetable oil. Beef fat (tallow). Lard. Sweets and desserts: Cookies. Cakes. Pies. Candy. Seasoning and other foods: Mayonnaise. Premade sauces and marinades. The items listed may not be a complete list. Talk with your dietitian about what dietary choices are right for you. Summary The Mediterranean diet includes both food and lifestyle choices. Eat a variety of fresh fruits and vegetables, beans, nuts, seeds, and whole grains. Limit the amount of red meat and sweets that you eat. Talk with your health care provider about whether it is safe for you to drink red wine in moderation. This means 1 glass a day for nonpregnant women and 2 glasses a day for men. A glass of wine equals 5 oz (150 mL). This information is not intended to replace advice given to you by your health care provider. Make sure you discuss any questions you have with your health care provider. Document Released: 08/12/2015 Document Revised: 09/14/2015 Document Reviewed: 08/12/2015 Elsevier  Interactive Patient Education  2017 [employer].

## 2021-12-27 ENCOUNTER — Other Ambulatory Visit: Payer: Self-pay

## 2021-12-27 ENCOUNTER — Other Ambulatory Visit: Payer: Self-pay | Admitting: Physician Assistant

## 2021-12-27 MED ORDER — MEMANTINE HCL 5 MG PO TABS: ORAL_TABLET | ORAL | 0 refills | Status: DC

## 2021-12-27 NOTE — Telephone Encounter (Signed)
Patient needs to have a refill sent in to [facility] on [address]   Memantine 5 mg

## 2022-03-17 ENCOUNTER — Other Ambulatory Visit: Payer: Self-pay | Admitting: Physician Assistant

## 2022-04-02 ENCOUNTER — Other Ambulatory Visit: Payer: Self-pay | Admitting: Physician Assistant

## 2022-05-01 ENCOUNTER — Ambulatory Visit: Payer: Medicare HMO | Admitting: Physician Assistant

## 2022-05-29 ENCOUNTER — Other Ambulatory Visit: Payer: Self-pay | Admitting: Physician Assistant

## 2022-07-26 ENCOUNTER — Ambulatory Visit (INDEPENDENT_AMBULATORY_CARE_PROVIDER_SITE_OTHER): Payer: Medicare HMO | Admitting: Physician Assistant

## 2022-07-26 ENCOUNTER — Encounter: Payer: Self-pay | Admitting: Physician Assistant

## 2022-07-26 VITALS — BP 139/81 | HR 84 | Resp 18 | Wt 235.0 lb

## 2022-07-26 DIAGNOSIS — F0280 Dementia in other diseases classified elsewhere without behavioral disturbance: Secondary | ICD-10-CM

## 2022-07-26 DIAGNOSIS — G309 Alzheimer's disease, unspecified: Secondary | ICD-10-CM | POA: Diagnosis not present

## 2022-07-26 MED ORDER — MEMANTINE HCL 10 MG PO TABS: 10.0000 mg | ORAL_TABLET | Freq: Two times a day (BID) | ORAL | 3 refills | Status: DC

## 2022-07-26 NOTE — Patient Instructions (Addendum)
It was a pleasure to see you today at our office.   Recommendations:  Follow up Jan 24  at 11:30  Continue  Memantine but increase to 10  tablets twice daily.    [facility] with [facility]  RECOMMENDATIONS FOR ALL PATIENTS WITH MEMORY PROBLEMS: 1. Continue to exercise (Recommend 30 minutes of walking everyday, or 3 hours every week) 2. Increase social interactions - continue going to [place] and enjoy social gatherings with friends and family 3. Eat healthy, avoid fried foods and eat more fruits and vegetables 4. Maintain adequate blood pressure, blood sugar, and blood cholesterol level. Reducing the risk of stroke and cardiovascular disease also helps promoting better memory. 5. Avoid stressful situations. Live a simple life and avoid aggravations. Organize your time and prepare for the next day in anticipation. 6. Sleep well, avoid any interruptions of sleep and avoid any distractions in the bedroom that may interfere with adequate sleep quality 7. Avoid sugar, avoid sweets as there is a strong link between excessive sugar intake, diabetes, and cognitive impairment We discussed the Mediterranean diet, which has been shown to help patients reduce the risk of progressive memory disorders and reduces cardiovascular risk. This includes eating fish, eat fruits and green leafy vegetables, nuts like almonds and hazelnuts, walnuts, and also use olive oil. Avoid fast foods and fried foods as much as possible. Avoid sweets and sugar as sugar use has been linked to worsening of memory function.  There is always a concern of gradual progression of memory problems. If this is the case, then we may need to adjust level of care according to patient needs. Support, both to the patient and caregiver, should then be put into place.      You have been referred for a neuropsychological evaluation (i.e., evaluation of memory and thinking abilities). Please bring someone with you to this  appointment if possible, as it is helpful for the doctor to hear from both you and another adult who knows you well. Please bring eyeglasses and hearing aids if you wear them.    The evaluation will take approximately 3 hours and has two parts:   The first part is a clinical interview with the neuropsychologist (Dr. Milbert Coulter or Dr. Roseanne Reno). During the interview, the neuropsychologist will speak with you and the individual you brought to the appointment.    The second part of the evaluation is testing with the doctor's technician Annabelle Harman or Selena Batten). During the testing, the technician will ask you to remember different types of material, solve problems, and answer some questionnaires. Your family member will not be present for this portion of the evaluation.   Please note: We must reserve several hours of the neuropsychologist's time and the psychometrician's time for your evaluation appointment. As such, there is a No-Show fee of $100. If you are unable to attend any of your appointments, please contact our office as soon as possible to reschedule.    FALL PRECAUTIONS: Be cautious when walking. Scan the area for obstacles that may increase the risk of trips and falls. When getting up in the mornings, sit up at the edge of the bed for a few minutes before getting out of bed. Consider elevating the bed at the head end to avoid drop of blood pressure when getting up. Walk always in a well-lit room (use night lights in the walls). Avoid area rugs or power cords from appliances in the middle of the walkways. Use a walker or a cane if  necessary and consider physical therapy for balance exercise. Get your eyesight checked regularly.  FINANCIAL OVERSIGHT: Supervision, especially oversight when making financial decisions or transactions is also recommended.  HOME SAFETY: Consider the safety of the kitchen when operating appliances like stoves, microwave oven, and blender. Consider having supervision and share cooking  responsibilities until no longer able to participate in those. Accidents with firearms and other hazards in the house should be identified and addressed as well.   ABILITY TO BE LEFT ALONE: If patient is unable to contact 911 operator, consider using LifeLine, or when the need is there, arrange for someone to stay with patients. Smoking is a fire hazard, consider supervision or cessation. Risk of wandering should be assessed by caregiver and if detected at any point, supervision and safe proof recommendations should be instituted.  MEDICATION SUPERVISION: Inability to self-administer medication needs to be constantly addressed. Implement a mechanism to ensure safe administration of the medications.   DRIVING: Regarding driving, in patients with progressive memory problems, driving will be impaired. We advise to have someone else do the driving if trouble finding directions or if minor accidents are reported. Independent driving assessment is available to determine safety of driving.   If you are interested in the driving assessment, you can contact the following:  [employer] in [place] [phone]  [facility] [phone]  [facility] [phone] or [phone]    Mediterranean Diet A Mediterranean diet refers to food and lifestyle choices that are based on the traditions of countries located on the [employer]. This way of eating has been shown to help prevent certain conditions and improve outcomes for people who have chronic diseases, like kidney disease and heart disease. What are tips for following this plan? Lifestyle  Cook and eat meals together with your family, when possible. Drink enough fluid to keep your urine clear or pale yellow. Be physically active every day. This includes: Aerobic exercise like running or swimming. Leisure activities like gardening, walking, or housework. Get 7-8  hours of sleep each night. If recommended by your health care provider, drink red wine in moderation. This means 1 glass a day for nonpregnant women and 2 glasses a day for men. A glass of wine equals 5 oz (150 mL). Reading food labels  Check the serving size of packaged foods. For foods such as rice and pasta, the serving size refers to the amount of cooked product, not dry. Check the total fat in packaged foods. Avoid foods that have saturated fat or trans fats. Check the ingredients list for added sugars, such as corn syrup. Shopping  At the grocery store, buy most of your food from the areas near the walls of the store. This includes: Fresh fruits and vegetables (produce). Grains, beans, nuts, and seeds. Some of these may be available in unpackaged forms or large amounts (in bulk). Fresh seafood. Poultry and eggs. Low-fat dairy products. Buy whole ingredients instead of prepackaged foods. Buy fresh fruits and vegetables in-season from local farmers markets. Buy frozen fruits and vegetables in resealable bags. If you do not have access to quality fresh seafood, buy precooked frozen shrimp or canned fish, such as tuna, salmon, or sardines. Buy small amounts of raw or cooked vegetables, salads, or olives from the deli or salad bar at your store. Stock your pantry so you always have certain foods on hand, such as olive oil, canned tuna, canned tomatoes, rice, pasta, and beans. Cooking  [employer]  with extra-virgin olive oil instead of using butter or other vegetable oils. Have meat as a side dish, and have vegetables or grains as your main dish. This means having meat in small portions or adding small amounts of meat to foods like pasta or stew. Use beans or vegetables instead of meat in common dishes like chili or lasagna. Experiment with different cooking methods. Try roasting or broiling vegetables instead of steaming or sauteing them. Add frozen vegetables to soups, stews, pasta, or  rice. Add nuts or seeds for added healthy fat at each meal. You can add these to yogurt, salads, or vegetable dishes. Marinate fish or vegetables using olive oil, lemon juice, garlic, and fresh herbs. Meal planning  Plan to eat 1 vegetarian meal one day each week. Try to work up to 2 vegetarian meals, if possible. Eat seafood 2 or more times a week. Have healthy snacks readily available, such as: Vegetable sticks with hummus. Greek yogurt. Fruit and nut trail mix. Eat balanced meals throughout the week. This includes: Fruit: 2-3 servings a day Vegetables: 4-5 servings a day Low-fat dairy: 2 servings a day Fish, poultry, or lean meat: 1 serving a day Beans and legumes: 2 or more servings a week Nuts and seeds: 1-2 servings a day Whole grains: 6-8 servings a day Extra-virgin olive oil: 3-4 servings a day Limit red meat and sweets to only a few servings a month What are my food choices? Mediterranean diet Recommended Grains: Whole-grain pasta. Brown rice. Bulgar wheat. Polenta. Couscous. Whole-wheat bread. Orpah Cobb. Vegetables: Artichokes. Beets. Broccoli. Cabbage. Carrots. Eggplant. Green beans. Chard. Kale. Spinach. Onions. Leeks. Peas. Squash. Tomatoes. Peppers. Radishes. Fruits: Apples. Apricots. Avocado. Berries. Bananas. Cherries. Dates. Figs. Grapes. Lemons. Melon. Oranges. Peaches. Plums. Pomegranate. Meats and other protein foods: Beans. Almonds. Sunflower seeds. Pine nuts. Peanuts. Cod. Salmon. Scallops. Shrimp. Tuna. Tilapia. Clams. Oysters. Eggs. Dairy: Low-fat milk. Cheese. Greek yogurt. Beverages: Water. Red wine. Herbal tea. Fats and oils: Extra virgin olive oil. Avocado oil. Grape seed oil. Sweets and desserts: [place] yogurt with honey. Baked apples. Poached pears. Trail mix. Seasoning and other foods: Basil. Cilantro. Coriander. Cumin. Mint. Parsley. Sage. Rosemary. Tarragon. Garlic. Oregano. Thyme. Pepper. Balsalmic vinegar. Tahini. Hummus. Tomato sauce. Olives.  Mushrooms. Limit these Grains: Prepackaged pasta or rice dishes. Prepackaged cereal with added sugar. Vegetables: Deep fried potatoes (french fries). Fruits: Fruit canned in syrup. Meats and other protein foods: Beef. Pork. Lamb. Poultry with skin. Hot dogs. Tomasa Blase. Dairy: Ice cream. Sour cream. Whole milk. Beverages: Juice. Sugar-sweetened soft drinks. Beer. Liquor and spirits. Fats and oils: Butter. Canola oil. Vegetable oil. Beef fat (tallow). Lard. Sweets and desserts: Cookies. Cakes. Pies. Candy. Seasoning and other foods: Mayonnaise. Premade sauces and marinades. The items listed may not be a complete list. Talk with your dietitian about what dietary choices are right for you. Summary The Mediterranean diet includes both food and lifestyle choices. Eat a variety of fresh fruits and vegetables, beans, nuts, seeds, and whole grains. Limit the amount of red meat and sweets that you eat. Talk with your health care provider about whether it is safe for you to drink red wine in moderation. This means 1 glass a day for nonpregnant women and 2 glasses a day for men. A glass of wine equals 5 oz (150 mL). This information is not intended to replace advice given to you by your health care provider. Make sure you discuss any questions you have with your health care provider. Document Released: 08/12/2015 Document Revised: 09/14/2015  Document Reviewed: 08/12/2015 Elsevier Interactive Patient Education  2017 [employer].
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
## 2022-10-11 DIAGNOSIS — H903 Sensorineural hearing loss, bilateral: Secondary | ICD-10-CM | POA: Diagnosis present

## 2022-10-11 HISTORY — DX: Sensorineural hearing loss, bilateral: H90.3

## 2023-01-26 ENCOUNTER — Ambulatory Visit: Payer: Medicare HMO | Admitting: Physician Assistant

## 2023-01-31 ENCOUNTER — Ambulatory Visit: Payer: Medicare HMO | Admitting: Physician Assistant

## 2023-02-15 ENCOUNTER — Ambulatory Visit: Payer: Medicare HMO | Admitting: Physician Assistant

## 2023-03-04 ENCOUNTER — Emergency Department (HOSPITAL_COMMUNITY)

## 2023-03-04 ENCOUNTER — Inpatient Hospital Stay (HOSPITAL_COMMUNITY)

## 2023-03-04 ENCOUNTER — Encounter (HOSPITAL_COMMUNITY): Payer: Self-pay | Admitting: Internal Medicine

## 2023-03-04 ENCOUNTER — Other Ambulatory Visit: Payer: Self-pay

## 2023-03-04 ENCOUNTER — Inpatient Hospital Stay (HOSPITAL_COMMUNITY)
Admission: EM | Admit: 2023-03-04 | Discharge: 2023-03-23 | DRG: 480 | Disposition: A | Discharge status: Home Health | Attending: Family Medicine | Admitting: Family Medicine

## 2023-03-04 DIAGNOSIS — F0282 Dementia in other diseases classified elsewhere, unspecified severity, with psychotic disturbance: Secondary | ICD-10-CM | POA: Diagnosis present

## 2023-03-04 DIAGNOSIS — N3281 Overactive bladder: Secondary | ICD-10-CM | POA: Diagnosis present

## 2023-03-04 DIAGNOSIS — Z6841 Body Mass Index (BMI) 40.0 and over, adult: Secondary | ICD-10-CM | POA: Diagnosis not present

## 2023-03-04 DIAGNOSIS — Y9301 Activity, walking, marching and hiking: Secondary | ICD-10-CM | POA: Diagnosis present

## 2023-03-04 DIAGNOSIS — Z515 Encounter for palliative care: Secondary | ICD-10-CM

## 2023-03-04 DIAGNOSIS — J690 Pneumonitis due to inhalation of food and vomit: Secondary | ICD-10-CM | POA: Diagnosis not present

## 2023-03-04 DIAGNOSIS — L89152 Pressure ulcer of sacral region, stage 2: Secondary | ICD-10-CM | POA: Diagnosis present

## 2023-03-04 DIAGNOSIS — Z1152 Encounter for screening for COVID-19: Secondary | ICD-10-CM

## 2023-03-04 DIAGNOSIS — N179 Acute kidney failure, unspecified: Secondary | ICD-10-CM | POA: Diagnosis not present

## 2023-03-04 DIAGNOSIS — R296 Repeated falls: Secondary | ICD-10-CM | POA: Diagnosis present

## 2023-03-04 DIAGNOSIS — H903 Sensorineural hearing loss, bilateral: Secondary | ICD-10-CM | POA: Diagnosis present

## 2023-03-04 DIAGNOSIS — H913 Deaf nonspeaking, not elsewhere classified: Secondary | ICD-10-CM | POA: Diagnosis present

## 2023-03-04 DIAGNOSIS — E870 Hyperosmolality and hypernatremia: Secondary | ICD-10-CM | POA: Diagnosis not present

## 2023-03-04 DIAGNOSIS — R1310 Dysphagia, unspecified: Secondary | ICD-10-CM | POA: Diagnosis not present

## 2023-03-04 DIAGNOSIS — Z7189 Other specified counseling: Secondary | ICD-10-CM | POA: Diagnosis not present

## 2023-03-04 DIAGNOSIS — J9601 Acute respiratory failure with hypoxia: Secondary | ICD-10-CM | POA: Diagnosis not present

## 2023-03-04 DIAGNOSIS — D631 Anemia in chronic kidney disease: Secondary | ICD-10-CM | POA: Diagnosis present

## 2023-03-04 DIAGNOSIS — G301 Alzheimer's disease with late onset: Secondary | ICD-10-CM | POA: Diagnosis present

## 2023-03-04 DIAGNOSIS — I130 Hypertensive heart and chronic kidney disease with heart failure and stage 1 through stage 4 chronic kidney disease, or unspecified chronic kidney disease: Secondary | ICD-10-CM | POA: Diagnosis present

## 2023-03-04 DIAGNOSIS — Y92091 Bathroom in other non-institutional residence as the place of occurrence of the external cause: Secondary | ICD-10-CM | POA: Diagnosis not present

## 2023-03-04 DIAGNOSIS — W010XXA Fall on same level from slipping, tripping and stumbling without subsequent striking against object, initial encounter: Secondary | ICD-10-CM | POA: Diagnosis present

## 2023-03-04 DIAGNOSIS — N184 Chronic kidney disease, stage 4 (severe): Secondary | ICD-10-CM | POA: Diagnosis present

## 2023-03-04 DIAGNOSIS — G9341 Metabolic encephalopathy: Secondary | ICD-10-CM | POA: Diagnosis not present

## 2023-03-04 DIAGNOSIS — Z720 Tobacco use: Secondary | ICD-10-CM

## 2023-03-04 DIAGNOSIS — A419 Sepsis, unspecified organism: Secondary | ICD-10-CM | POA: Diagnosis not present

## 2023-03-04 DIAGNOSIS — H9193 Unspecified hearing loss, bilateral: Secondary | ICD-10-CM | POA: Diagnosis not present

## 2023-03-04 DIAGNOSIS — D62 Acute posthemorrhagic anemia: Secondary | ICD-10-CM | POA: Diagnosis not present

## 2023-03-04 DIAGNOSIS — D638 Anemia in other chronic diseases classified elsewhere: Secondary | ICD-10-CM | POA: Diagnosis present

## 2023-03-04 DIAGNOSIS — M25562 Pain in left knee: Secondary | ICD-10-CM | POA: Diagnosis not present

## 2023-03-04 DIAGNOSIS — S72401A Unspecified fracture of lower end of right femur, initial encounter for closed fracture: Secondary | ICD-10-CM | POA: Diagnosis present

## 2023-03-04 DIAGNOSIS — Z713 Dietary counseling and surveillance: Secondary | ICD-10-CM

## 2023-03-04 DIAGNOSIS — R4701 Aphasia: Secondary | ICD-10-CM | POA: Diagnosis present

## 2023-03-04 DIAGNOSIS — N1832 Chronic kidney disease, stage 3b: Secondary | ICD-10-CM | POA: Diagnosis present

## 2023-03-04 DIAGNOSIS — R0600 Dyspnea, unspecified: Secondary | ICD-10-CM

## 2023-03-04 DIAGNOSIS — I129 Hypertensive chronic kidney disease with stage 1 through stage 4 chronic kidney disease, or unspecified chronic kidney disease: Secondary | ICD-10-CM | POA: Diagnosis not present

## 2023-03-04 DIAGNOSIS — Z8701 Personal history of pneumonia (recurrent): Secondary | ICD-10-CM

## 2023-03-04 DIAGNOSIS — J9811 Atelectasis: Secondary | ICD-10-CM | POA: Diagnosis present

## 2023-03-04 DIAGNOSIS — J189 Pneumonia, unspecified organism: Secondary | ICD-10-CM | POA: Diagnosis not present

## 2023-03-04 DIAGNOSIS — Z9181 History of falling: Secondary | ICD-10-CM

## 2023-03-04 DIAGNOSIS — M9711XA Periprosthetic fracture around internal prosthetic right knee joint, initial encounter: Secondary | ICD-10-CM | POA: Diagnosis present

## 2023-03-04 DIAGNOSIS — S72441A Displaced fracture of lower epiphysis (separation) of right femur, initial encounter for closed fracture: Secondary | ICD-10-CM | POA: Diagnosis not present

## 2023-03-04 DIAGNOSIS — S72451A Displaced supracondylar fracture without intracondylar extension of lower end of right femur, initial encounter for closed fracture: Secondary | ICD-10-CM | POA: Diagnosis not present

## 2023-03-04 DIAGNOSIS — F0284 Dementia in other diseases classified elsewhere, unspecified severity, with anxiety: Secondary | ICD-10-CM | POA: Diagnosis present

## 2023-03-04 DIAGNOSIS — E875 Hyperkalemia: Secondary | ICD-10-CM | POA: Diagnosis not present

## 2023-03-04 DIAGNOSIS — I5033 Acute on chronic diastolic (congestive) heart failure: Secondary | ICD-10-CM | POA: Diagnosis not present

## 2023-03-04 DIAGNOSIS — F411 Generalized anxiety disorder: Secondary | ICD-10-CM | POA: Diagnosis not present

## 2023-03-04 DIAGNOSIS — I509 Heart failure, unspecified: Secondary | ICD-10-CM | POA: Diagnosis not present

## 2023-03-04 DIAGNOSIS — Z79899 Other long term (current) drug therapy: Secondary | ICD-10-CM

## 2023-03-04 DIAGNOSIS — M47812 Spondylosis without myelopathy or radiculopathy, cervical region: Secondary | ICD-10-CM | POA: Diagnosis present

## 2023-03-04 HISTORY — DX: Personal history of urinary calculi: Z87.442

## 2023-03-04 LAB — COMPREHENSIVE METABOLIC PANEL
ALT: 10 U/L (ref 0–44)
AST: 23 U/L (ref 15–41)
Albumin: 3.2 g/dL — ABNORMAL LOW (ref 3.5–5.0)
Alkaline Phosphatase: 46 U/L (ref 38–126)
Anion gap: 10 (ref 5–15)
BUN: 26 mg/dL — ABNORMAL HIGH (ref 8–23)
CO2: 25 mmol/L (ref 22–32)
Calcium: 8.6 mg/dL — ABNORMAL LOW (ref 8.9–10.3)
Chloride: 105 mmol/L (ref 98–111)
Creatinine, Ser: 2 mg/dL — ABNORMAL HIGH (ref 0.44–1.00)
GFR, Estimated: 24 mL/min — ABNORMAL LOW (ref >60)
Glucose, Bld: 115 mg/dL — ABNORMAL HIGH (ref 70–99)
Potassium: 4.4 mmol/L (ref 3.5–5.1)
Sodium: 140 mmol/L (ref 135–145)
Total Bilirubin: 0.8 mg/dL (ref 0.0–1.2)
Total Protein: 6.1 g/dL — ABNORMAL LOW (ref 6.5–8.1)

## 2023-03-04 LAB — CBC WITH DIFFERENTIAL/PLATELET
Abs Immature Granulocytes: 0.06 10*3/uL (ref 0.00–0.07)
Basophils Absolute: 0 10*3/uL (ref 0.0–0.1)
Basophils Relative: 0 %
Eosinophils Absolute: 0 10*3/uL (ref 0.0–0.5)
Eosinophils Relative: 0 %
HCT: 35 % — ABNORMAL LOW (ref 36.0–46.0)
Hemoglobin: 10.7 g/dL — ABNORMAL LOW (ref 12.0–15.0)
Immature Granulocytes: 1 %
Lymphocytes Relative: 8 %
Lymphs Abs: 0.8 10*3/uL (ref 0.7–4.0)
MCH: 28.8 pg (ref 26.0–34.0)
MCHC: 30.6 g/dL (ref 30.0–36.0)
MCV: 94.3 fL (ref 80.0–100.0)
Monocytes Absolute: 1 10*3/uL (ref 0.1–1.0)
Monocytes Relative: 11 %
Neutro Abs: 7.7 10*3/uL (ref 1.7–7.7)
Neutrophils Relative %: 80 %
Platelets: 118 10*3/uL — ABNORMAL LOW (ref 150–400)
RBC: 3.71 MIL/uL — ABNORMAL LOW (ref 3.87–5.11)
RDW: 18 % — ABNORMAL HIGH (ref 11.5–15.5)
WBC: 9.6 10*3/uL (ref 4.0–10.5)
nRBC: 0 % (ref 0.0–0.2)

## 2023-03-04 LAB — RESP PANEL BY RT-PCR (RSV, FLU A&B, COVID)  RVPGX2
Influenza A by PCR: NEGATIVE
Influenza B by PCR: NEGATIVE
Resp Syncytial Virus by PCR: NEGATIVE
SARS Coronavirus 2 by RT PCR: NEGATIVE

## 2023-03-04 LAB — LACTIC ACID, PLASMA
Lactic Acid, Venous: 1.2 mmol/L (ref 0.5–1.9)
Lactic Acid, Venous: 1.9 mmol/L (ref 0.5–1.9)

## 2023-03-04 LAB — TROPONIN I (HIGH SENSITIVITY)
Troponin I (High Sensitivity): 18 ng/L — ABNORMAL HIGH (ref <18)
Troponin I (High Sensitivity): 18 ng/L — ABNORMAL HIGH (ref <18)

## 2023-03-04 LAB — BRAIN NATRIURETIC PEPTIDE: B Natriuretic Peptide: 325 pg/mL — ABNORMAL HIGH (ref 0.0–100.0)

## 2023-03-04 MED ORDER — ESCITALOPRAM OXALATE 10 MG PO TABS
10.0000 mg | ORAL_TABLET | Freq: Every day | ORAL | Status: DC
  Administered 2023-03-05 – 2023-03-06 (×2): 10 mg via ORAL
  Filled 2023-03-04 (×2): qty 1

## 2023-03-04 MED ORDER — POLYETHYLENE GLYCOL 3350 17 G PO PACK: 17.0000 g | PACK | Freq: Every day | ORAL | Status: DC | PRN

## 2023-03-04 MED ORDER — IPRATROPIUM-ALBUTEROL 0.5-2.5 (3) MG/3ML IN SOLN
3.0000 mL | Freq: Four times a day (QID) | RESPIRATORY_TRACT | Status: DC | PRN
  Administered 2023-03-06 – 2023-03-14 (×2): 3 mL via RESPIRATORY_TRACT
  Filled 2023-03-04 (×2): qty 3

## 2023-03-04 MED ORDER — CLONAZEPAM 0.5 MG PO TABS: 0.5000 mg | ORAL_TABLET | Freq: Every day | ORAL | Status: DC | PRN

## 2023-03-04 MED ORDER — AMLODIPINE BESYLATE 5 MG PO TABS
5.0000 mg | ORAL_TABLET | Freq: Every day | ORAL | Status: DC
  Administered 2023-03-05 – 2023-03-06 (×2): 5 mg via ORAL
  Filled 2023-03-04 (×2): qty 1

## 2023-03-04 MED ORDER — MEMANTINE HCL 10 MG PO TABS
10.0000 mg | ORAL_TABLET | Freq: Two times a day (BID) | ORAL | Status: DC
  Administered 2023-03-04 – 2023-03-06 (×5): 10 mg via ORAL
  Filled 2023-03-04 (×5): qty 1

## 2023-03-04 MED ORDER — HYDROCODONE-ACETAMINOPHEN 5-325 MG PO TABS
1.0000 | ORAL_TABLET | Freq: Four times a day (QID) | ORAL | Status: DC | PRN
  Administered 2023-03-04 – 2023-03-05 (×2): 1 via ORAL
  Filled 2023-03-04 (×2): qty 1

## 2023-03-04 MED ORDER — HYDROMORPHONE HCL 1 MG/ML IJ SOLN
0.5000 mg | INTRAMUSCULAR | Status: DC | PRN
  Administered 2023-03-04 – 2023-03-06 (×2): 0.5 mg via INTRAVENOUS
  Filled 2023-03-04: qty 1
  Filled 2023-03-04: qty 0.5

## 2023-03-04 MED ORDER — OXYCODONE-ACETAMINOPHEN 5-325 MG PO TABS
1.0000 | ORAL_TABLET | Freq: Once | ORAL | Status: AC
  Administered 2023-03-04: 1 via ORAL
  Filled 2023-03-04: qty 1

## 2023-03-04 MED ORDER — IPRATROPIUM-ALBUTEROL 0.5-2.5 (3) MG/3ML IN SOLN
3.0000 mL | Freq: Once | RESPIRATORY_TRACT | Status: AC
  Administered 2023-03-04: 3 mL via RESPIRATORY_TRACT
  Filled 2023-03-04: qty 3

## 2023-03-04 MED ORDER — CLONAZEPAM 0.25 MG PO TBDP
0.2500 mg | ORAL_TABLET | Freq: Every day | ORAL | Status: DC
  Administered 2023-03-04 – 2023-03-05 (×2): 0.25 mg via ORAL
  Filled 2023-03-04 (×2): qty 1

## 2023-03-04 MED ORDER — DORZOLAMIDE HCL-TIMOLOL MAL 2-0.5 % OP SOLN
1.0000 [drp] | Freq: Two times a day (BID) | OPHTHALMIC | Status: DC
  Administered 2023-03-04 – 2023-03-23 (×38): 1 [drp] via OPHTHALMIC
  Filled 2023-03-04 (×2): qty 10

## 2023-03-04 MED ORDER — GUAIFENESIN 100 MG/5ML PO LIQD
5.0000 mL | ORAL | Status: DC | PRN
  Administered 2023-03-14: 5 mL via ORAL
  Filled 2023-03-04: qty 10

## 2023-03-04 MED ORDER — MELATONIN 5 MG PO TABS
5.0000 mg | ORAL_TABLET | Freq: Every day | ORAL | Status: DC
  Administered 2023-03-04 – 2023-03-06 (×3): 5 mg via ORAL
  Filled 2023-03-04 (×3): qty 1

## 2023-03-04 MED ORDER — OXYBUTYNIN CHLORIDE 5 MG PO TABS
5.0000 mg | ORAL_TABLET | Freq: Three times a day (TID) | ORAL | Status: DC
  Administered 2023-03-04 – 2023-03-06 (×6): 5 mg via ORAL
  Filled 2023-03-04 (×7): qty 1

## 2023-03-04 MED ORDER — MORPHINE SULFATE (PF) 4 MG/ML IV SOLN
4.0000 mg | Freq: Once | INTRAVENOUS | Status: AC
  Administered 2023-03-04: 4 mg via INTRAVENOUS
  Filled 2023-03-04: qty 1

## 2023-03-04 NOTE — H&P (Signed)
 History and Physical   CACHE DECOURSEY EAV:[id number] DOB: 1940/09/19 DOA: 03/04/2023  PCP: Lula Olszewski, MD   Patient coming from: Home  Chief Complaint: Falls, knee pain  HPI: Sharon Ramos is a 83 y.o. female with medical history significant of hypertension, CKD 4, anxiety, hallucination, delusion, Alzheimer's dementia, anemia, deaf, mute, OAB, obesity presenting with multiple falls and knee pain.  Patient has had 2 falls in the last day.  Patient reports that her leg gave out when she was walking last night and she landed on her right knee.  She was able to get up but has had pain since that time.  Denies any prodrome and unsure if she hit her head.  This morning she was standing at the sink and her right knee gave out again and she fell to the floor.  Now severe pain to the point where she is unable to bear weight.    Son reports he feels she may be somewhat confused over baseline and feels that she has had some shortness of breath with past week.  No reported fevers, chills, chest pain, abdominal pain, constipation, diarrhea, nausea, vomiting.  ED Course: Vital signs in the ED notable for blood pressure in the 130s to 150s systolic.  Lab workup included CMP with BUN 26, creatinine stable 2.0, glucose 115, calcium 8.6, protein 6.1, albumin 3.2.  CBC with hemoglobin of 10.7 but no recent prior to compare, platelets 118.  Lactic acid normal with repeat pending.  BNP mildly elevated at 325.  Troponin 18, repeat pending.  Rester panel for flu COVID RSV negative.  Imaging studies included chest x-ray which showed no acute normality but minimal atelectasis versus airspace disease, pelvis x-ray showed no acute normality.  Right femur x-ray and right knee x-ray showed displaced fracture of the distal femur on the right.  CT head showed no acute abnormality.  CT C-spine showed no acute abnormality but did showed stable spondylosis.  Patient received morphine, oxycodone, DuoNeb in the ED.   Orthopedics is consulted and plan for likely surgery tomorrow.  Review of Systems: As per HPI otherwise all other systems reviewed and are negative.  Past Medical History:  Diagnosis Date   Auditory hallucinations 11/17/2020   Hypertension     Past Surgical History:  Procedure Laterality Date   KNEE SURGERY      Social History  reports that she has never smoked. She has never used smokeless tobacco. She reports current alcohol use of about 21.0 standard drinks of alcohol per week. She reports that she does not use drugs.  No Known Allergies  No family history on file.  Prior to Admission medications   Medication Sig Start Date End Date Taking? Authorizing Provider  amLODipine (NORVASC) 5 MG tablet  04/21/20   [provider]  clonazePAM (KLONOPIN) 0.25 MG disintegrating tablet Take 0.25 mg by mouth at bedtime. 01/07/21   [provider]  clonazePAM (KLONOPIN) 0.5 MG tablet Take 0.5 mg by mouth daily. 07/24/22   [provider]  diclofenac Sodium (VOLTAREN) 1 % GEL Apply 1 application. topically 4 (four) times daily. 03/24/21   [provider]  dorzolamide-timolol (COSOPT) 22.3-6.8 MG/ML ophthalmic solution 1 drop 2 (two) times daily. 11/26/20   [provider]  escitalopram (LEXAPRO) 10 MG tablet Take 10 mg by mouth daily. 04/17/22   [provider]  gemfibrozil (LOPID) 600 MG tablet Gemfibrozil    [provider]  MELATONIN MAXIMUM STRENGTH 5 MG TABS Take 5 mg by  mouth at bedtime. 11/09/20   [provider]  memantine (NAMENDA) 10 MG tablet Take 1 tablet (10 mg total) by mouth 2 (two) times daily. 07/26/22   Marcos Eke, PA-C  oxybutynin (DITROPAN) 5 MG tablet TAKE 1 TABLET BY MOUTH THREE TIMES A DAY TAKE ONE TABLET THREE A DAY 06/23/20   [provider]    Physical Exam: Vitals:   03/04/23 1427 03/04/23 1435 03/04/23 1630  BP: (!) 154/78  134/78  Pulse: 83  80  Resp: 17  17  Temp: 97.9 F (36.6 C)     TempSrc: Oral    SpO2: 99%  100%  Weight:  106.6 kg   Height:  5\' 9"  (1.753 m)     Physical Exam Constitutional:      General: She is not in acute distress.    Appearance: Normal appearance. She is obese.  HENT:     Head: Normocephalic and atraumatic.     Mouth/Throat:     Mouth: Mucous membranes are moist.     Pharynx: Oropharynx is clear.  Eyes:     Extraocular Movements: Extraocular movements intact.     Pupils: Pupils are equal, round, and reactive to light.  Cardiovascular:     Rate and Rhythm: Normal rate and regular rhythm.     Pulses: Normal pulses.     Heart sounds: Normal heart sounds.  Pulmonary:     Effort: Pulmonary effort is normal. No respiratory distress.     Breath sounds: Normal breath sounds.  Abdominal:     General: Bowel sounds are normal. There is no distension.     Palpations: Abdomen is soft.     Tenderness: There is no abdominal tenderness.  Musculoskeletal:        General: No swelling or deformity.     Comments: Bilateral lower extremities neurovascularly intact.  Right lower extremity foreshortened and internally rotated.  Skin:    General: Skin is warm and dry.  Neurological:     General: No focal deficit present.     Mental Status: Mental status is at baseline.    Labs on Admission: I have personally reviewed following labs and imaging studies  CBC: Recent Labs  Lab 03/04/23 1607  WBC 9.6  NEUTROABS 7.7  HGB 10.7*  HCT 35.0*  MCV 94.3  PLT 118*    Basic Metabolic Panel: Recent Labs  Lab 03/04/23 1607  NA 140  K 4.4  CL 105  CO2 25  GLUCOSE 115*  BUN 26*  CREATININE 2.00*  CALCIUM 8.6*    GFR: Estimated Creatinine Clearance: 28.2 mL/min (A) (by C-G formula based on SCr of 2 mg/dL (H)).  Liver Function Tests: Recent Labs  Lab 03/04/23 1607  AST 23  ALT 10  ALKPHOS 46  BILITOT 0.8  PROT 6.1*  ALBUMIN 3.2*    Urine analysis:    Component Value Date/Time   COLORURINE YELLOW 12/15/2020 2039   APPEARANCEUR  CLEAR 12/15/2020 2039   LABSPEC 1.010 12/15/2020 2039   PHURINE 6.0 12/15/2020 2039   GLUCOSEU NEGATIVE 12/15/2020 2039   HGBUR NEGATIVE 12/15/2020 2039   BILIRUBINUR NEGATIVE 12/15/2020 2039   KETONESUR NEGATIVE 12/15/2020 2039   PROTEINUR NEGATIVE 12/15/2020 2039   UROBILINOGEN 0.2 05/20/2010 0549   NITRITE NEGATIVE 12/15/2020 2039   LEUKOCYTESUR TRACE (A) 12/15/2020 2039    Radiological Exams on Admission: CT Head Wo Contrast Result Date: 03/04/2023 CLINICAL DATA:  Multiple falls. EXAM: CT HEAD WITHOUT CONTRAST TECHNIQUE: Contiguous axial images were obtained from the  base of the skull through the vertex without intravenous contrast. RADIATION DOSE REDUCTION: This exam was performed according to the departmental dose-optimization program which includes automated exposure control, adjustment of the mA and/or kV according to patient size and/or use of iterative reconstruction technique. COMPARISON:  December 15, 2020 FINDINGS: Brain: There is mild cerebral atrophy with widening of the extra-axial spaces and ventricular dilatation. There are areas of decreased attenuation within the white matter tracts of the supratentorial brain, consistent with microvascular disease changes. Vascular: No hyperdense vessel or unexpected calcification. Skull: Normal. Negative for fracture or focal lesion. Sinuses/Orbits: There is mild bilateral maxillary sinus and mild bilateral ethmoid sinus mucosal thickening. Other: None. IMPRESSION: 1. No acute intracranial abnormality. 2. Generalized cerebral atrophy with widening of the extra-axial spaces and ventricular dilatation. 3. Mild bilateral maxillary sinus and mild bilateral ethmoid sinus disease. Electronically Signed   By: Aram Candela M.D.   On: 03/04/2023 17:18   CT Cervical Spine Wo Contrast Result Date: 03/04/2023 CLINICAL DATA:  Neck trauma. Fall at home last night and this morning. EXAM: CT CERVICAL SPINE WITHOUT CONTRAST TECHNIQUE: Multidetector CT  imaging of the cervical spine was performed without intravenous contrast. Multiplanar CT image reconstructions were also generated. RADIATION DOSE REDUCTION: This exam was performed according to the departmental dose-optimization program which includes automated exposure control, adjustment of the mA and/or kV according to patient size and/or use of iterative reconstruction technique. COMPARISON:  CT cervical spine 07/24/2020. FINDINGS: Alignment: Stable normal alignment. Skull base and vertebrae: No evidence of acute fracture or traumatic subluxation. Multilevel spondylosis with probable interbody ankylosis at C6-7. Soft tissues and spinal canal: No prevertebral fluid or swelling. No visible canal hematoma. Disc levels: Multilevel spondylosis with disc space narrowing, endplate osteophytes and facet hypertrophy. There is the potential for spinal stenosis at several levels, greatest at C6-7, grossly stable. Mild multilevel osseous foraminal narrowing. Upper chest: Clear lung apices. Bilateral carotid atherosclerosis. Grossly stable 1.3 cm left thyroid nodule on image 76/6 for which no specific follow-up imaging is recommended. Other: None. IMPRESSION: 1. No evidence of acute cervical spine fracture, traumatic subluxation or static signs of instability. 2. Multilevel cervical spondylosis, grossly stable. Electronically Signed   By: Carey Bullocks M.D.   On: 03/04/2023 17:17   DG FEMUR, MIN 2 VIEWS RIGHT Result Date: 03/04/2023 CLINICAL DATA:  Fall with knee pain. EXAM: RIGHT FEMUR 2 VIEWS COMPARISON:  Knee radiographs dated 07/24/2020. FINDINGS: The patient is status post a knee arthroplasty. There is a displaced (1 shaft with) and overriding (approximately 8 cm) fracture of the distal femur. IMPRESSION: Displaced and overriding fracture of the distal femur. Electronically Signed   By: Romona Curls M.D.   On: 03/04/2023 16:17   DG Chest 2 View Result Date: 03/04/2023 CLINICAL DATA:  Cough, coarse breath sounds.  EXAM: CHEST - 2 VIEW COMPARISON:  Chest radiograph dated 10/08/2013 FINDINGS: The heart is enlarged. Minimal bibasilar atelectasis/airspace disease. No significant pleural effusion or pneumothorax. Degenerative changes are seen in the spine. IMPRESSION: Minimal bibasilar atelectasis/airspace disease. Electronically Signed   By: Romona Curls M.D.   On: 03/04/2023 16:13   DG Pelvis 1-2 Views Result Date: 03/04/2023 CLINICAL DATA:  Fall with pelvic pain. EXAM: PELVIS - 1-2 VIEW COMPARISON:  Pelvic radiographs dated 10/07/2013. FINDINGS: There is no evidence of pelvic fracture or diastasis. 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 Severe degenerative changes are seen in the lumbar spine. Stool overlies the rectum. IMPRESSION: 1. No acute osseous injury. Electronically Signed   By:  Romona Curls M.D.   On: 03/04/2023 16:11   DG Knee 1-2 Views Right Result Date: 03/04/2023 CLINICAL DATA:  Fall with knee pain. EXAM: RIGHT KNEE - 1-2 VIEW COMPARISON:  Knee radiographs dated 07/24/2020 FINDINGS: The patient is status post a knee arthroplasty. There is a displaced (1 shaft width) and overriding (approximately 8 cm) fracture of the distal femur. No knee joint dislocation, however there likely is a knee joint effusion. IMPRESSION: Displaced and overriding fracture of the distal femur. Electronically Signed   By: Romona Curls M.D.   On: 03/04/2023 16:10   EKG: Independently reviewed. Sinus rhythm at 79 bpm.  Nonspecific intraventricular conduction delay with QRS 133.  Significant baseline artifact in leads I, 2, aVR, aVL, V1.  Assessment/Plan Principal Problem:   Closed fracture of right distal femur (HCC) Active Problems:   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)   Anemia of chronic disease   Chronic kidney disease, stage 4 (severe) (HCC)   Generalized anxiety disorder   Mutism   Sensorineural hearing loss (SNHL) of both ears   Severe obesity (HCC)   Close fracture of right distal  femur Fall > 2 falls in the last day.  Pain in the right knee after first fall last night, that was persistent.  Now, unable to bear weight due to pain after second fall this morning. > Imaging positive for displaced fracture of the right distal femur. > Orthopedic consulted in the ED, likely surgery 3/3. - Monitor on telemetry overnight - Appreciate orthopedic recommendations and assistance - Continue as needed pain medication - Consult to anesthesia for nerve block - Femur fracture protocol - Supportive care  Shortness of breath ?Confusion > Son reported feeling like patient was short of breath last week and that she may have been a little confused.  Difficult to tell with language barrier and baseline dementia. > Patient felt better after DuoNeb in the ED.  Rester panel for flu COVID RSV negative.  No leukocytosis. - Check full RVP - As needed DuoNeb and Robitussin  Hypertension - Continue home amlodipine  CKD 4 > Creatinine stable 2.0 - Trend renal function and electrolytes  Anxiety History of delusions History of auditory hallucination Alzheimer's dementia - Continue home Lexapro and Namenda - Continue as needed clonazepam  OAB - Continue oxybutynin  Deaf Mutism - Requires ASL translator (is available on the tablet translators, in person preferred typically)   DVT prophylaxis: SCDs Code Status:   Full Family Communication:  Updated at bedside  Disposition Plan:   Patient is from:  Home  Anticipated DC to:  Pending medical course  Anticipated DC date:  2 to 4 days  Anticipated DC barriers: None  Consults called:  Orthopedic surgery Admission status:  Inpatient, telemetry  Severity of Illness: The appropriate patient status for this patient is INPATIENT. Inpatient status is judged to be reasonable and necessary in order to provide the required intensity of service to ensure the patient's safety. The patient's presenting symptoms, physical exam findings, and  initial radiographic and laboratory data in the context of their chronic comorbidities is felt to place them at high risk for further clinical deterioration. Furthermore, it is not anticipated that the patient will be medically stable for discharge from the hospital within 2 midnights of admission.   * I certify that at the point of admission it is my clinical judgment that the patient will require inpatient hospital care spanning beyond 2 midnights from the point of admission due to high  intensity of service, high risk for further deterioration and high frequency of surveillance required.Synetta Fail MD [facility]  How to contact the [facility] Attending or Consulting provider 7A - 7P or covering provider during after hours 7P -7A, for this patient?   Check the care team in [facility] and look for a) attending/consulting [facility] provider listed and b) the [facility] team listed Log into [email or web address] and use [place]'s universal password to access. If you do not have the password, please contact the hospital operator. Locate the [facility] provider you are looking for under [facility] and page to a number that you can be directly reached. If you still have difficulty reaching the provider, please page the [facility] (Director on Call) for the Hospitalists listed on amion for assistance.  03/04/2023, 5:56 PM

## 2023-03-04 NOTE — ED Triage Notes (Addendum)
 Pt bib ptar from home. Pt fell last night and then again fell this morning in the shower. Pt knee appears to be buckled in and swollen per ems.   Pt is coughing, uses walker at baseline and deaf. PT uses American sign language .  No other known medical hx at this time.   Pt has hx of bp.   EMS unable to obtain bp due to tremors.  [id number] Spo2 [id number]

## 2023-03-04 NOTE — ED Provider Notes (Signed)
 [place] EMERGENCY DEPARTMENT AT [facility] Provider Note   CSN: [id number] Arrival date & time: 03/04/23  1417     History  Chief Complaint  Patient presents with   Knee Pain   Fall    Sharon Ramos is a 83 y.o. female, history of hypertension, deafness, who presents to the ED secondary to 2 falls, in the past 18 hours.  She states last night, she was walking, and just fell.  She fell on her right knee, and was able to get up, but is hurt since then.  Does not know if she hit her head or not.  Denies any other pain.  Denies any chest pain, shortness of breath, abdominal pain, nausea, vomiting, or dizziness prior to the fall.  She states "I just fell."  Notes that after that, this morning, she was standing at the sink, and felt her right knee buckle, and she fell to the floor again.  Unknown if hit head.  States her primary pain is in the right knee, and she hardly can put any weight on it.    Son at bedside, states patient seems confused, and she has been short of breath, for the last week.  And has not had a cough.  No known fevers  Home Medications Prior to Admission medications   Medication Sig Start Date End Date Taking? Authorizing Provider  amLODipine (NORVASC) 5 MG tablet  04/21/20   [provider]  clonazePAM (KLONOPIN) 0.25 MG disintegrating tablet Take 0.25 mg by mouth at bedtime. 01/07/21   [provider]  clonazePAM (KLONOPIN) 0.5 MG tablet Take 0.5 mg by mouth daily. 07/24/22   [provider]  diclofenac Sodium (VOLTAREN) 1 % GEL Apply 1 application. topically 4 (four) times daily. 03/24/21   [provider]  dorzolamide-timolol (COSOPT) 22.3-6.8 MG/ML ophthalmic solution 1 drop 2 (two) times daily. 11/26/20   [provider]  escitalopram (LEXAPRO) 10 MG tablet Take 10 mg by mouth daily. 04/17/22   [provider]  gemfibrozil (LOPID) 600 MG tablet Gemfibrozil    [provider]  MELATONIN  MAXIMUM STRENGTH 5 MG TABS Take 5 mg by mouth at bedtime. 11/09/20   [provider]  memantine (NAMENDA) 10 MG tablet Take 1 tablet (10 mg total) by mouth 2 (two) times daily. 07/26/22   Marcos Eke, PA-C  oxybutynin (DITROPAN) 5 MG tablet TAKE 1 TABLET BY MOUTH THREE TIMES A DAY TAKE ONE TABLET THREE A DAY 06/23/20   [provider]      Allergies    Patient has no known allergies.    Review of Systems   Review of Systems  Constitutional:  Negative for fever.  Respiratory:  Positive for cough and shortness of breath.     Physical Exam Updated Vital Signs BP 134/78   Pulse 80   Temp 97.9 F (36.6 C) (Oral)   Resp 17   Ht 5\' 9"  (1.753 m)   Wt 106.6 kg   SpO2 100%   BMI 34.71 kg/m  Physical Exam Vitals and nursing note reviewed.  Constitutional:      General: She is not in acute distress.    Appearance: She is well-developed.  HENT:     Head: Normocephalic and atraumatic.  Eyes:     Conjunctiva/sclera: Conjunctivae normal.  Cardiovascular:     Rate and Rhythm: Normal rate and regular rhythm.     Heart sounds: No murmur heard. Pulmonary:     Effort: Pulmonary  effort is normal. No respiratory distress.     Breath sounds: Normal breath sounds.  Abdominal:     Palpations: Abdomen is soft.     Tenderness: There is no abdominal tenderness.  Musculoskeletal:        General: No swelling.     Cervical back: Neck supple.     Comments: Right lower extremity: Tenderness to palpation of proximal femur, as well as distal femur, and a gross anterior knee tenderness to palpation, predominantly the lateral joint line. An effusion is present.  Negative anterior and posterior drawer. Negative Mcmurray's. +Patellar stability. Negative valgus and varus stress test.. Extension and flexion intact. No sensory deficits.    Skin:    General: Skin is warm and dry.     Capillary Refill: Capillary refill takes less than 2 seconds.     Comments: No ecchymosis, on chest,  abdomen, pelvis, extremities or back.  Neurological:     Mental Status: She is alert.     Comments: Patient is AO x 3, but confused.  Psychiatric:        Mood and Affect: Mood normal.     ED Results / Procedures / Treatments   Labs (all labs ordered are listed, but only abnormal results are displayed) Labs Reviewed  CBC WITH DIFFERENTIAL/PLATELET - Abnormal; Notable for the following components:      Result Value   RBC 3.71 (*)    Hemoglobin 10.7 (*)    HCT 35.0 (*)    RDW 18.0 (*)    Platelets 118 (*)    All other components within normal limits  COMPREHENSIVE METABOLIC PANEL - Abnormal; Notable for the following components:   Glucose, Bld 115 (*)    BUN 26 (*)    Creatinine, Ser 2.00 (*)    Calcium 8.6 (*)    Total Protein 6.1 (*)    Albumin 3.2 (*)    GFR, Estimated 24 (*)    All other components within normal limits  BRAIN NATRIURETIC PEPTIDE - Abnormal; Notable for the following components:   B Natriuretic Peptide 325.0 (*)    All other components within normal limits  TROPONIN I (HIGH SENSITIVITY) - Abnormal; Notable for the following components:   Troponin I (High Sensitivity) 18 (*)    All other components within normal limits  TROPONIN I (HIGH SENSITIVITY) - Abnormal; Notable for the following components:   Troponin I (High Sensitivity) 18 (*)    All other components within normal limits  RESP PANEL BY RT-PCR (RSV, FLU A&B, COVID)  RVPGX2  LACTIC ACID, PLASMA  LACTIC ACID, PLASMA  TYPE AND SCREEN    EKG None  Radiology CT Head Wo Contrast Result Date: 03/04/2023 CLINICAL DATA:  Multiple falls. EXAM: CT HEAD WITHOUT CONTRAST TECHNIQUE: Contiguous axial images were obtained from the base of the skull through the vertex without intravenous contrast. RADIATION DOSE REDUCTION: This exam was performed according to the departmental dose-optimization program which includes automated exposure control, adjustment of the mA and/or kV according to patient size and/or use  of iterative reconstruction technique. COMPARISON:  December 15, 2020 FINDINGS: Brain: There is mild cerebral atrophy with widening of the extra-axial spaces and ventricular dilatation. There are areas of decreased attenuation within the white matter tracts of the supratentorial brain, consistent with microvascular disease changes. Vascular: No hyperdense vessel or unexpected calcification. Skull: Normal. Negative for fracture or focal lesion. Sinuses/Orbits: There is mild bilateral maxillary sinus and mild bilateral ethmoid sinus mucosal thickening. Other: None. IMPRESSION: 1. No acute  intracranial abnormality. 2. Generalized cerebral atrophy with widening of the extra-axial spaces and ventricular dilatation. 3. Mild bilateral maxillary sinus and mild bilateral ethmoid sinus disease. Electronically Signed   By: Aram Candela M.D.   On: 03/04/2023 17:18   CT Cervical Spine Wo Contrast Result Date: 03/04/2023 CLINICAL DATA:  Neck trauma. Fall at home last night and this morning. EXAM: CT CERVICAL SPINE WITHOUT CONTRAST TECHNIQUE: Multidetector CT imaging of the cervical spine was performed without intravenous contrast. Multiplanar CT image reconstructions were also generated. RADIATION DOSE REDUCTION: This exam was performed according to the departmental dose-optimization program which includes automated exposure control, adjustment of the mA and/or kV according to patient size and/or use of iterative reconstruction technique. COMPARISON:  CT cervical spine 07/24/2020. FINDINGS: Alignment: Stable normal alignment. Skull base and vertebrae: No evidence of acute fracture or traumatic subluxation. Multilevel spondylosis with probable interbody ankylosis at C6-7. Soft tissues and spinal canal: No prevertebral fluid or swelling. No visible canal hematoma. Disc levels: Multilevel spondylosis with disc space narrowing, endplate osteophytes and facet hypertrophy. There is the potential for spinal stenosis at several  levels, greatest at C6-7, grossly stable. Mild multilevel osseous foraminal narrowing. Upper chest: Clear lung apices. Bilateral carotid atherosclerosis. Grossly stable 1.3 cm left thyroid nodule on image 76/6 for which no specific follow-up imaging is recommended. Other: None. IMPRESSION: 1. No evidence of acute cervical spine fracture, traumatic subluxation or static signs of instability. 2. Multilevel cervical spondylosis, grossly stable. Electronically Signed   By: Carey Bullocks M.D.   On: 03/04/2023 17:17   DG FEMUR, MIN 2 VIEWS RIGHT Result Date: 03/04/2023 CLINICAL DATA:  Fall with knee pain. EXAM: RIGHT FEMUR 2 VIEWS COMPARISON:  Knee radiographs dated 07/24/2020. FINDINGS: The patient is status post a knee arthroplasty. There is a displaced (1 shaft with) and overriding (approximately 8 cm) fracture of the distal femur. IMPRESSION: Displaced and overriding fracture of the distal femur. Electronically Signed   By: Romona Curls M.D.   On: 03/04/2023 16:17   DG Chest 2 View Result Date: 03/04/2023 CLINICAL DATA:  Cough, coarse breath sounds. EXAM: CHEST - 2 VIEW COMPARISON:  Chest radiograph dated 10/08/2013 FINDINGS: The heart is enlarged. Minimal bibasilar atelectasis/airspace disease. No significant pleural effusion or pneumothorax. Degenerative changes are seen in the spine. IMPRESSION: Minimal bibasilar atelectasis/airspace disease. Electronically Signed   By: Romona Curls M.D.   On: 03/04/2023 16:13   DG Pelvis 1-2 Views Result Date: 03/04/2023 CLINICAL DATA:  Fall with pelvic pain. EXAM: PELVIS - 1-2 VIEW COMPARISON:  Pelvic radiographs dated 10/07/2013. FINDINGS: There is no evidence of pelvic fracture or diastasis. 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 Severe degenerative changes are seen in the lumbar spine. Stool overlies the rectum. IMPRESSION: 1. No acute osseous injury. Electronically Signed   By: Romona Curls M.D.   On: 03/04/2023 16:11   DG Knee 1-2 Views Right Result  Date: 03/04/2023 CLINICAL DATA:  Fall with knee pain. EXAM: RIGHT KNEE - 1-2 VIEW COMPARISON:  Knee radiographs dated 07/24/2020 FINDINGS: The patient is status post a knee arthroplasty. There is a displaced (1 shaft width) and overriding (approximately 8 cm) fracture of the distal femur. No knee joint dislocation, however there likely is a knee joint effusion. IMPRESSION: Displaced and overriding fracture of the distal femur. Electronically Signed   By: Romona Curls M.D.   On: 03/04/2023 16:10    Procedures Procedures    Medications Ordered in ED Medications  memantine (NAMENDA) tablet 10 mg (has no  administration in time range)  escitalopram (LEXAPRO) tablet 10 mg (has no administration in time range)  oxybutynin (DITROPAN) tablet 5 mg (has no administration in time range)  melatonin tablet 5 mg (has no administration in time range)  HYDROcodone-acetaminophen (NORCO/VICODIN) 5-325 MG per tablet 1 tablet (has no administration in time range)  HYDROmorphone (DILAUDID) injection 0.5 mg (has no administration in time range)  polyethylene glycol (MIRALAX / GLYCOLAX) packet 17 g (has no administration in time range)  ipratropium-albuterol (DUONEB) 0.5-2.5 (3) MG/3ML nebulizer solution 3 mL (3 mLs Nebulization Given 03/04/23 1551)  oxyCODONE-acetaminophen (PERCOCET/ROXICET) 5-325 MG per tablet 1 tablet (1 tablet Oral Given 03/04/23 1614)  morphine (PF) 4 MG/ML injection 4 mg (4 mg Intravenous Given 03/04/23 1718)    ED Course/ Medical Decision Making/ A&P                                 Medical Decision Making Patient is an 83 year old female, here for 2 falls that have occurred in the last 24 hours, she has fallen on her right knee, both times, and is unable to walk now.  She is unsure if she struck her head.  She has no bruising to her abdomen, or chest.  She has no tenderness to palpation of these areas.  Will obtain a head CT, neck CT, given possible head injury, as well as x-rays of her knees,  for further evaluation, she is tender to her right knee, as well as for proximal femur, x-rays ordered.  She is also coarse on exam, breath sounds, states she has been short of breath recently.  Will obtain a BNP, chest x-ray, COVID/flu for further evaluation and give patient a DuoNeb and monitor if any relief.  She is currently afebrile  Amount and/or Complexity of Data Reviewed Labs: ordered.    Details: Creatinine baseline at 2.0, BNP, mildly elevated at 325, trops flat at 18, respiratory panel negative Radiology: ordered.    Details: Femur, shows displaced and overriding fracture of the distal femur, negative head and neck CT Discussion of management or test interpretation with external provider(s): Cussed with patient, she has evidence of any of displaced overriding fracture of the distal frenum femur, Dr. Anitra Lauth, spoke to orthopedics, who states likely surgery tomorrow, and place patient in knee immobilizer.  She improved with the DuoNeb, this may be secondary to some type of bronchitis or viral illness.  No evidence of pneumonia on her chest x-ray, and afebrile, negative lactic acid, nonhypoxic on exam.  Overall reassuring.  Spoke with Dr. Alinda Money, he accepted admission of the patient.  Confusion may be due to history of Alzheimer's  Risk Prescription drug management. Decision regarding hospitalization.   Final Clinical Impression(s) / ED Diagnoses Final diagnoses:  Closed displaced fracture of distal epiphysis of right femur, initial encounter (HCC)  Dyspnea, unspecified type    Rx / DC Orders ED Discharge Orders     None         Ashlie Mcmenamy, [place], PA 03/04/23 1818    Gwyneth Sprout, MD 03/04/23 2018

## 2023-03-04 NOTE — Consult Note (Signed)
 Orthopedic Consultation Note  Current Hospital Day : Hospital Day: 1  Reason For Consult: Right distal femur fracture  History of Present Illness:  Sharon Ramos is a 83 y.o. female who presented to the emergency today after multiple falls.  She is found of a right distal femur fracture, periprosthetic for which orthopedic surgery was consulted.  She is deaf and communicates using American sign language.  We completed our visit using a video sign language interpreter.  Her son is also here with her in the emergency department and he is an employee in the OR at [employer].  She reportedly had her right knee replacement over 10 years ago.  She had no issues with it previously.  She lives at home and use a walker to ambulate, not doing too much ambulation.  Presently she is having pain in the right knee region  Past Medical History:  Diagnosis Date   Hypertension     Past Surgical History:  Procedure Laterality Date   KNEE SURGERY      Prior to Admission medications   Medication Sig Start Date End Date Taking? Authorizing Provider  amLODipine (NORVASC) 5 MG tablet  04/21/20   [provider]  clonazePAM (KLONOPIN) 0.25 MG disintegrating tablet Take 0.25 mg by mouth at bedtime. 01/07/21   [provider]  clonazePAM (KLONOPIN) 0.5 MG tablet Take 0.5 mg by mouth daily. 07/24/22   [provider]  diclofenac Sodium (VOLTAREN) 1 % GEL Apply 1 application. topically 4 (four) times daily. 03/24/21   [provider]  dorzolamide-timolol (COSOPT) 22.3-6.8 MG/ML ophthalmic solution 1 drop 2 (two) times daily. 11/26/20   [provider]  escitalopram (LEXAPRO) 10 MG tablet Take 10 mg by mouth daily. 04/17/22   [provider]  gemfibrozil (LOPID) 600 MG tablet Gemfibrozil    [provider]  meclizine (ANTIVERT) 12.5 MG tablet Take 1 tablet (12.5 mg total) by mouth 3 (three) times daily as needed for dizziness. 12/15/20   Arby Barrette, MD  MELATONIN MAXIMUM STRENGTH 5 MG TABS Take 5 mg by mouth at bedtime. 11/09/20   [provider]  memantine (NAMENDA) 10 MG tablet Take 1 tablet (10 mg total) by mouth 2 (two) times daily. 07/26/22   Marcos Eke, PA-C  oxybutynin (DITROPAN) 5 MG tablet TAKE 1 TABLET BY MOUTH THREE TIMES A DAY TAKE ONE TABLET THREE A DAY 06/23/20   [provider]    Physical Examination Right Lower Extremity: Previously healed midline incision + ankle dorsiflexion/plantarflexion/EHL SILT SP/DP/T Foot wwp   Imaging: X-rays of the right knee and femur reviewed interpreted demonstrating a periprosthetic right distal femur fracture.  Assessment:   Sharon Ramos is a 83 y.o. female present to the hospital after ground-level fall with a right periprosthetic distal femur fracture  I discussed with her and her son that we will plan for surgery, likely tomorrow.  This will either be with myself or with one of our trauma specialist.  Plan:   Nonweightbearing, right lower extremity OR tomorrow, 3/3, n.p.o. at midnight DVT ppx: SCDs, hold chemoprophylaxis pending surgery tomorrow Appreciate medicine pre-op eval/optimization

## 2023-03-04 NOTE — Progress Notes (Signed)
 Orthopedic Tech Progress Note Patient Details:  Sharon Ramos 01-13-1940 [id number]  Knee immobilizer applied to R leg in best obtainable fashion d/t pt body habitus.   Ortho Devices Type of Ortho Device: Knee Immobilizer Ortho Device/Splint Location: RLE Ortho Device/Splint Interventions: Ordered, Application, Adjustment   Post Interventions Patient Tolerated: Fair Instructions Provided: Care of device, Adjustment of device  [name] Carmine Savoy 03/04/2023, 7:05 PM

## 2023-03-04 NOTE — ED Notes (Signed)
 Patient transported to CT, CCMD made aware.

## 2023-03-05 ENCOUNTER — Inpatient Hospital Stay (HOSPITAL_COMMUNITY): Admitting: Anesthesiology

## 2023-03-05 ENCOUNTER — Encounter (HOSPITAL_COMMUNITY)
Admission: EM | Disposition: A | Discharge status: Home Health | Payer: Self-pay | Source: Home / Self Care | Attending: Family Medicine

## 2023-03-05 ENCOUNTER — Inpatient Hospital Stay (HOSPITAL_COMMUNITY)

## 2023-03-05 ENCOUNTER — Other Ambulatory Visit: Payer: Self-pay

## 2023-03-05 ENCOUNTER — Encounter (HOSPITAL_COMMUNITY): Payer: Self-pay | Admitting: Internal Medicine

## 2023-03-05 DIAGNOSIS — S72401A Unspecified fracture of lower end of right femur, initial encounter for closed fracture: Secondary | ICD-10-CM | POA: Diagnosis not present

## 2023-03-05 DIAGNOSIS — G301 Alzheimer's disease with late onset: Secondary | ICD-10-CM | POA: Diagnosis not present

## 2023-03-05 DIAGNOSIS — I129 Hypertensive chronic kidney disease with stage 1 through stage 4 chronic kidney disease, or unspecified chronic kidney disease: Secondary | ICD-10-CM

## 2023-03-05 DIAGNOSIS — N184 Chronic kidney disease, stage 4 (severe): Secondary | ICD-10-CM | POA: Diagnosis not present

## 2023-03-05 HISTORY — PX: ORIF FEMUR FRACTURE: SHX2119

## 2023-03-05 LAB — CBC
HCT: 28.6 % — ABNORMAL LOW (ref 36.0–46.0)
Hemoglobin: 9 g/dL — ABNORMAL LOW (ref 12.0–15.0)
MCH: 29.3 pg (ref 26.0–34.0)
MCHC: 31.5 g/dL (ref 30.0–36.0)
MCV: 93.2 fL (ref 80.0–100.0)
Platelets: 107 10*3/uL — ABNORMAL LOW (ref 150–400)
RBC: 3.07 MIL/uL — ABNORMAL LOW (ref 3.87–5.11)
RDW: 18.1 % — ABNORMAL HIGH (ref 11.5–15.5)
WBC: 10.1 10*3/uL (ref 4.0–10.5)
nRBC: 0.2 % (ref 0.0–0.2)

## 2023-03-05 LAB — SURGICAL PCR SCREEN
MRSA, PCR: NEGATIVE
Staphylococcus aureus: NEGATIVE

## 2023-03-05 LAB — TYPE AND SCREEN
ABO/RH(D): O POS
Antibody Screen: NEGATIVE

## 2023-03-05 LAB — CREATININE, SERUM
Creatinine, Ser: 2.99 mg/dL — ABNORMAL HIGH (ref 0.44–1.00)
GFR, Estimated: 15 mL/min — ABNORMAL LOW (ref >60)

## 2023-03-05 SURGERY — OPEN REDUCTION INTERNAL FIXATION (ORIF) DISTAL FEMUR FRACTURE
Anesthesia: General | Laterality: Right

## 2023-03-05 MED ORDER — CEFAZOLIN SODIUM-DEXTROSE 2-4 GM/100ML-% IV SOLN
2.0000 g | INTRAVENOUS | Status: AC
  Administered 2023-03-05: 2 g via INTRAVENOUS
  Filled 2023-03-05: qty 100

## 2023-03-05 MED ORDER — VANCOMYCIN HCL 1000 MG IV SOLR
INTRAVENOUS | Status: DC | PRN
  Administered 2023-03-05: 1000 mg via TOPICAL

## 2023-03-05 MED ORDER — ACETAMINOPHEN 325 MG PO TABS
650.0000 mg | ORAL_TABLET | Freq: Four times a day (QID) | ORAL | Status: DC
  Administered 2023-03-05 – 2023-03-13 (×17): 650 mg via ORAL
  Filled 2023-03-05 (×17): qty 2

## 2023-03-05 MED ORDER — SUGAMMADEX SODIUM 200 MG/2ML IV SOLN
INTRAVENOUS | Status: AC
  Filled 2023-03-05: qty 2

## 2023-03-05 MED ORDER — DOCUSATE SODIUM 100 MG PO CAPS
100.0000 mg | ORAL_CAPSULE | Freq: Two times a day (BID) | ORAL | Status: DC
  Administered 2023-03-05 – 2023-03-13 (×7): 100 mg via ORAL
  Filled 2023-03-05 (×9): qty 1

## 2023-03-05 MED ORDER — CEFAZOLIN SODIUM-DEXTROSE 2-4 GM/100ML-% IV SOLN
2.0000 g | Freq: Three times a day (TID) | INTRAVENOUS | Status: AC
  Administered 2023-03-05 – 2023-03-06 (×3): 2 g via INTRAVENOUS
  Filled 2023-03-05 (×3): qty 100

## 2023-03-05 MED ORDER — TRANEXAMIC ACID-NACL 1000-0.7 MG/100ML-% IV SOLN
1000.0000 mg | INTRAVENOUS | Status: AC
  Administered 2023-03-05: 1000 mg via INTRAVENOUS
  Filled 2023-03-05: qty 100

## 2023-03-05 MED ORDER — ENOXAPARIN SODIUM 40 MG/0.4ML IJ SOSY
40.0000 mg | PREFILLED_SYRINGE | INTRAMUSCULAR | Status: DC
  Administered 2023-03-06: 40 mg via SUBCUTANEOUS
  Filled 2023-03-05: qty 0.4

## 2023-03-05 MED ORDER — 0.9 % SODIUM CHLORIDE (POUR BTL) OPTIME
TOPICAL | Status: DC | PRN
  Administered 2023-03-05: 1000 mL

## 2023-03-05 MED ORDER — PROPOFOL 10 MG/ML IV BOLUS
INTRAVENOUS | Status: DC | PRN
  Administered 2023-03-05: 150 mg via INTRAVENOUS

## 2023-03-05 MED ORDER — POVIDONE-IODINE 10 % EX SWAB
2.0000 | Freq: Once | CUTANEOUS | Status: AC
  Administered 2023-03-05: 2 via TOPICAL

## 2023-03-05 MED ORDER — SODIUM CHLORIDE 0.9 % IV SOLN: 12.5000 mg | INTRAVENOUS | Status: DC | PRN

## 2023-03-05 MED ORDER — HYDRALAZINE HCL 10 MG PO TABS: 10.0000 mg | ORAL_TABLET | Freq: Four times a day (QID) | ORAL | Status: DC | PRN

## 2023-03-05 MED ORDER — CHLORHEXIDINE GLUCONATE 0.12 % MT SOLN
OROMUCOSAL | Status: AC
  Administered 2023-03-05: 15 mL via OROMUCOSAL
  Filled 2023-03-05: qty 15

## 2023-03-05 MED ORDER — CHLORHEXIDINE GLUCONATE 0.12 % MT SOLN: 15.0000 mL | Freq: Once | OROMUCOSAL | Status: AC

## 2023-03-05 MED ORDER — LIDOCAINE 2% (20 MG/ML) 5 ML SYRINGE
INTRAMUSCULAR | Status: DC | PRN
  Administered 2023-03-05: 60 mg via INTRAVENOUS

## 2023-03-05 MED ORDER — METOCLOPRAMIDE HCL 5 MG PO TABS: 5.0000 mg | ORAL_TABLET | Freq: Three times a day (TID) | ORAL | Status: DC | PRN

## 2023-03-05 MED ORDER — METHOCARBAMOL 500 MG PO TABS
500.0000 mg | ORAL_TABLET | Freq: Three times a day (TID) | ORAL | Status: DC | PRN
  Administered 2023-03-05 – 2023-03-06 (×2): 500 mg via ORAL
  Filled 2023-03-05 (×2): qty 1

## 2023-03-05 MED ORDER — VANCOMYCIN HCL 1000 MG IV SOLR
INTRAVENOUS | Status: AC
  Filled 2023-03-05: qty 20

## 2023-03-05 MED ORDER — ROCURONIUM BROMIDE 10 MG/ML (PF) SYRINGE
PREFILLED_SYRINGE | INTRAVENOUS | Status: AC
  Filled 2023-03-05: qty 10

## 2023-03-05 MED ORDER — SODIUM CHLORIDE 0.9 % IV SOLN: INTRAVENOUS | Status: DC

## 2023-03-05 MED ORDER — FENTANYL CITRATE (PF) 250 MCG/5ML IJ SOLN
INTRAMUSCULAR | Status: DC | PRN
  Administered 2023-03-05: 50 ug via INTRAVENOUS

## 2023-03-05 MED ORDER — ONDANSETRON HCL 4 MG/2ML IJ SOLN
INTRAMUSCULAR | Status: DC | PRN
  Administered 2023-03-05: 4 mg via INTRAVENOUS

## 2023-03-05 MED ORDER — METOCLOPRAMIDE HCL 5 MG/ML IJ SOLN: 5.0000 mg | Freq: Three times a day (TID) | INTRAMUSCULAR | Status: DC | PRN

## 2023-03-05 MED ORDER — ORAL CARE MOUTH RINSE: 15.0000 mL | Freq: Once | OROMUCOSAL | Status: AC

## 2023-03-05 MED ORDER — PROPOFOL 10 MG/ML IV BOLUS
INTRAVENOUS | Status: AC
  Filled 2023-03-05: qty 20

## 2023-03-05 MED ORDER — SUGAMMADEX SODIUM 200 MG/2ML IV SOLN
INTRAVENOUS | Status: DC | PRN
  Administered 2023-03-05: 200 mg via INTRAVENOUS

## 2023-03-05 MED ORDER — DIPHENHYDRAMINE HCL 12.5 MG/5ML PO ELIX: 12.5000 mg | ORAL_SOLUTION | ORAL | Status: DC | PRN

## 2023-03-05 MED ORDER — ONDANSETRON HCL 4 MG PO TABS
4.0000 mg | ORAL_TABLET | Freq: Four times a day (QID) | ORAL | Status: DC | PRN
Start: 2023-03-05 — End: 2023-03-23

## 2023-03-05 MED ORDER — OXYCODONE HCL 5 MG PO TABS
5.0000 mg | ORAL_TABLET | ORAL | Status: DC | PRN
  Administered 2023-03-06: 5 mg via ORAL
  Filled 2023-03-05: qty 1

## 2023-03-05 MED ORDER — QUETIAPINE FUMARATE 25 MG PO TABS
25.0000 mg | ORAL_TABLET | Freq: Once | ORAL | Status: AC
  Administered 2023-03-05: 25 mg via ORAL
  Filled 2023-03-05: qty 1

## 2023-03-05 MED ORDER — POLYETHYLENE GLYCOL 3350 17 G PO PACK
17.0000 g | PACK | Freq: Two times a day (BID) | ORAL | Status: AC
  Administered 2023-03-05 – 2023-03-06 (×3): 17 g via ORAL
  Filled 2023-03-05 (×3): qty 1

## 2023-03-05 MED ORDER — ROCURONIUM BROMIDE 10 MG/ML (PF) SYRINGE
PREFILLED_SYRINGE | INTRAVENOUS | Status: DC | PRN
  Administered 2023-03-05: 50 mg via INTRAVENOUS
  Administered 2023-03-05: 20 mg via INTRAVENOUS

## 2023-03-05 MED ORDER — METHOCARBAMOL 1000 MG/10ML IJ SOLN: 500.0000 mg | Freq: Three times a day (TID) | INTRAMUSCULAR | Status: DC | PRN

## 2023-03-05 MED ORDER — CHLORHEXIDINE GLUCONATE 4 % EX SOLN
60.0000 mL | Freq: Once | CUTANEOUS | Status: AC
  Administered 2023-03-05: 4 via TOPICAL

## 2023-03-05 MED ORDER — FENTANYL CITRATE (PF) 100 MCG/2ML IJ SOLN: 25.0000 ug | INTRAMUSCULAR | Status: DC | PRN

## 2023-03-05 MED ORDER — QUETIAPINE FUMARATE 25 MG PO TABS: 25.0000 mg | ORAL_TABLET | Freq: Every evening | ORAL | Status: DC | PRN

## 2023-03-05 MED ORDER — TRANEXAMIC ACID-NACL 1000-0.7 MG/100ML-% IV SOLN
1000.0000 mg | Freq: Once | INTRAVENOUS | Status: AC
  Administered 2023-03-05: 1000 mg via INTRAVENOUS
  Filled 2023-03-05: qty 100

## 2023-03-05 MED ORDER — ONDANSETRON HCL 4 MG/2ML IJ SOLN
4.0000 mg | Freq: Four times a day (QID) | INTRAMUSCULAR | Status: DC | PRN
Start: 2023-03-05 — End: 2023-03-23

## 2023-03-05 MED ORDER — FENTANYL CITRATE (PF) 250 MCG/5ML IJ SOLN
INTRAMUSCULAR | Status: AC
  Filled 2023-03-05: qty 5

## 2023-03-05 MED ORDER — LIDOCAINE 2% (20 MG/ML) 5 ML SYRINGE
INTRAMUSCULAR | Status: AC
  Filled 2023-03-05: qty 5

## 2023-03-05 SURGICAL SUPPLY — 60 items
BAG COUNTER SPONGE SURGICOUNT (BAG) ×1 IMPLANT
BIT DRILL 4.3X300MM (BIT) IMPLANT
BIT DRILL LONG 3.3 (BIT) IMPLANT
BIT DRILL QC 3.3X195 (BIT) IMPLANT
BLADE CLIPPER SURG (BLADE) IMPLANT
BNDG COHESIVE 6X5 TAN ST LF (GAUZE/BANDAGES/DRESSINGS) ×1 IMPLANT
BNDG ELASTIC 4INX 5YD STR LF (GAUZE/BANDAGES/DRESSINGS) IMPLANT
BNDG ELASTIC 6INX 5YD STR LF (GAUZE/BANDAGES/DRESSINGS) IMPLANT
BNDG ELASTIC 6X10 VLCR STRL LF (GAUZE/BANDAGES/DRESSINGS) ×1 IMPLANT
BRUSH SCRUB EZ PLAIN DRY (MISCELLANEOUS) ×2 IMPLANT
CANISTER SUCT 3000ML PPV (MISCELLANEOUS) ×1 IMPLANT
CAP LOCK NCB (Cap) IMPLANT
CHLORAPREP W/TINT 26 (MISCELLANEOUS) ×1 IMPLANT
COVER SURGICAL LIGHT HANDLE (MISCELLANEOUS) ×1 IMPLANT
DRAPE C-ARM 42X72 X-RAY (DRAPES) ×1 IMPLANT
DRAPE C-ARMOR (DRAPES) ×1 IMPLANT
DRAPE HALF SHEET 40X57 (DRAPES) ×2 IMPLANT
DRAPE SURG 17X23 STRL (DRAPES) ×1 IMPLANT
DRAPE SURG ORHT 6 SPLT 77X108 (DRAPES) ×2 IMPLANT
DRAPE U-SHAPE 47X51 STRL (DRAPES) ×1 IMPLANT
DRESSING MEPILEX FLEX 4X4 (GAUZE/BANDAGES/DRESSINGS) IMPLANT
DRSG ADAPTIC 3X8 NADH LF (GAUZE/BANDAGES/DRESSINGS) IMPLANT
DRSG MEPILEX FLEX 4X4 (GAUZE/BANDAGES/DRESSINGS) IMPLANT
DRSG MEPILEX POST OP 4X12 (GAUZE/BANDAGES/DRESSINGS) IMPLANT
DRSG MEPILEX POST OP 4X8 (GAUZE/BANDAGES/DRESSINGS) IMPLANT
ELECT REM PT RETURN 9FT ADLT (ELECTROSURGICAL) ×1 IMPLANT
ELECTRODE REM PT RTRN 9FT ADLT (ELECTROSURGICAL) ×1 IMPLANT
GAUZE PAD ABD 8X10 STRL (GAUZE/BANDAGES/DRESSINGS) ×3 IMPLANT
GAUZE SPONGE 4X4 12PLY STRL (GAUZE/BANDAGES/DRESSINGS) ×1 IMPLANT
GLOVE BIO SURGEON STRL SZ 6.5 (GLOVE) ×3 IMPLANT
GLOVE BIO SURGEON STRL SZ7.5 (GLOVE) ×4 IMPLANT
GLOVE BIOGEL PI IND STRL 6.5 (GLOVE) ×1 IMPLANT
GLOVE BIOGEL PI IND STRL 7.5 (GLOVE) ×1 IMPLANT
GOWN STRL REUS W/ TWL LRG LVL3 (GOWN DISPOSABLE) ×3 IMPLANT
K-WIRE FXSTD 280X2XNS SS (WIRE) ×1 IMPLANT
KIT BASIN OR (CUSTOM PROCEDURE TRAY) ×1 IMPLANT
KIT TURNOVER KIT B (KITS) ×1 IMPLANT
KWIRE FXSTD 280X2XNS SS (WIRE) IMPLANT
NS IRRIG 1000ML POUR BTL (IV SOLUTION) ×1 IMPLANT
PACK TOTAL JOINT (CUSTOM PROCEDURE TRAY) ×1 IMPLANT
PAD ARMBOARD 7.5X6 YLW CONV (MISCELLANEOUS) ×1 IMPLANT
PAD CAST 4YDX4 CTTN HI CHSV (CAST SUPPLIES) ×1 IMPLANT
PADDING CAST ABS COTTON 4X4 ST (CAST SUPPLIES) IMPLANT
PADDING CAST COTTON 6X4 STRL (CAST SUPPLIES) ×1 IMPLANT
PLATE FEM DIST NCB PP 278MM (Plate) IMPLANT
SCREW CORT NCB SELFTAP 5.0X42 (Screw) IMPLANT
SCREW NCB 4.0MX42M (Screw) IMPLANT
SCREW NCB 5.0X38 (Screw) IMPLANT
SCREW NCB 5.0X46MM (Screw) IMPLANT
SCREW NCB 5.0X85MM (Screw) IMPLANT
SPONGE T-LAP 18X18 ~~LOC~~+RFID (SPONGE) IMPLANT
STAPLER VISISTAT 35W (STAPLE) ×1 IMPLANT
SUCTION TUBE FRAZIER 10FR DISP (SUCTIONS) ×1 IMPLANT
SUT ETHILON 3 0 PS 1 (SUTURE) ×2 IMPLANT
SUT VIC AB 0 CT1 27XBRD ANBCTR (SUTURE) IMPLANT
SUT VIC AB 1 CT1 27XBRD ANBCTR (SUTURE) IMPLANT
SUT VIC AB 2-0 CT1 TAPERPNT 27 (SUTURE) ×2 IMPLANT
TOWEL GREEN STERILE (TOWEL DISPOSABLE) ×2 IMPLANT
TRAY FOLEY MTR SLVR 16FR STAT (SET/KITS/TRAYS/PACK) IMPLANT
WATER STERILE IRR 1000ML POUR (IV SOLUTION) ×2 IMPLANT

## 2023-03-05 NOTE — Consult Note (Signed)
 WOC Nurse Consult Note: WOC consult requested for a Stage 2 pressure injury; this was noted to be present on admission according to the bedside nurses' wound care flow sheet.  These can be treated independently by the bedside nurses using the skin care order set as follows: Foam dressing, change Q 3 days or PRN soiling.  Please re-consult if further assistance is needed.  Thank-you,  Cammie Mcgee MSN, RN, CWOCN, [place], CNS [phone]

## 2023-03-05 NOTE — Consult Note (Signed)
 Reason for Consult:Right distal femur fx Referring Physician: Rosalee Kaufman Time called: [id number] Time at bedside: 0903   Sharon Ramos is an 83 y.o. female.  HPI: Sharon Ramos was in the bathroom yesterday and slipped and fell. She had immediate right knee pain and could not get up. She was brought to the ED where x-rays showed a periprosthetic distal femur fx and orthopedic surgery was consulted. Due to the complexity of the fx orthopedic trauma consultation was requested. She lives at home with her daughter and uses a rollator to ambulate.  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 No pertinent family history.  Social History:  reports that she has never smoked. She has never used smokeless tobacco. She reports current alcohol use of about 21.0 standard drinks of alcohol per week. She reports that she does not use drugs.  Allergies: No Known Allergies  Medications: I have reviewed the patient's current medications.  Results for orders placed or performed during the hospital encounter of 03/04/23 (from the past 48 hours)  CBC with Differential     Status: Abnormal   Collection Time: 03/04/23  4:07 PM  Result Value Ref Range   WBC 9.6 4.0 - 10.5 K/uL   RBC 3.71 (L) 3.87 - 5.11 MIL/uL   Hemoglobin 10.7 (L) 12.0 - 15.0 g/dL   HCT 11.9 (L) 14.7 - 82.9 %   MCV 94.3 80.0 - 100.0 fL   MCH 28.8 26.0 - 34.0 pg   MCHC 30.6 30.0 - 36.0 g/dL   RDW 56.2 (H) 13.0 - 86.5 %   Platelets 118 (L) 150 - 400 K/uL   nRBC 0.0 0.0 - 0.2 %   Neutrophils Relative % 80 %   Neutro Abs 7.7 1.7 - 7.7 K/uL   Lymphocytes Relative 8 %   Lymphs Abs 0.8 0.7 - 4.0 K/uL   Monocytes Relative 11 %   Monocytes Absolute 1.0 0.1 - 1.0 K/uL   Eosinophils Relative 0 %   Eosinophils Absolute 0.0 0.0 - 0.5 K/uL   Basophils Relative 0 %   Basophils Absolute 0.0 0.0 - 0.1 K/uL    Immature Granulocytes 1 %   Abs Immature Granulocytes 0.06 0.00 - 0.07 K/uL    Comment: Performed at [facility], [address] [place], [place] [address]  Comprehensive metabolic panel     Status: Abnormal   Collection Time: 03/04/23  4:07 PM  Result Value Ref Range   Sodium 140 135 - 145 mmol/L   Potassium 4.4 3.5 - 5.1 mmol/L   Chloride 105 98 - 111 mmol/L   CO2 25 22 - 32 mmol/L   Glucose, Bld 115 (H) 70 - 99 mg/dL    Comment: Glucose reference range applies only to samples taken after fasting for at least 8 hours.   BUN 26 (H) 8 - 23 mg/dL   Creatinine, Ser 6.29 (H) 0.44 - 1.00 mg/dL   Calcium 8.6 (L) 8.9 - 10.3 mg/dL   Total Protein 6.1 (L) 6.5 - 8.1 g/dL   Albumin 3.2 (L) 3.5 - 5.0 g/dL   AST 23 15 - 41 U/L   ALT 10 0 - 44 U/L   Alkaline Phosphatase 46 38 - 126 U/L   Total Bilirubin 0.8 0.0 - 1.2 mg/dL   GFR, Estimated 24 (L) >60 mL/min    Comment: (  NOTE) Calculated using the CKD-EPI Creatinine Equation (2021)    Anion gap 10 5 - 15    Comment: Performed at [facility], [address] [place], [place] [address]  Lactic acid, plasma     Status: None   Collection Time: 03/04/23  4:07 PM  Result Value Ref Range   Lactic Acid, Venous 1.2 0.5 - 1.9 mmol/L    Comment: Performed at [facility], [address], [place], [place] [address]  Resp panel by RT-PCR (RSV, Flu A&B, Covid) Anterior Nasal Swab     Status: None   Collection Time: 03/04/23  4:07 PM   Specimen: Anterior Nasal Swab  Result Value Ref Range   SARS Coronavirus 2 by RT PCR NEGATIVE NEGATIVE   Influenza A by PCR NEGATIVE NEGATIVE   Influenza B by PCR NEGATIVE NEGATIVE    Comment: (NOTE) The Xpert Xpress SARS-CoV-2/FLU/RSV plus assay is intended as an aid in the diagnosis of influenza from Nasopharyngeal swab specimens and should not be used as a sole basis for treatment. Nasal washings and aspirates are unacceptable for Xpert Xpress SARS-CoV-2/FLU/RSV testing.  Fact Sheet for  Patients: BloggerCourse.com  Fact Sheet for Healthcare Providers: SeriousBroker.it  This test is not yet approved or cleared by the [place] FDA and has been authorized for detection and/or diagnosis of SARS-CoV-2 by FDA under an Emergency Use Authorization (EUA). This EUA will remain in effect (meaning this test can be used) for the duration of the COVID-19 declaration under Section 564(b)(1) of the Act, 21 U.S.C. section 360bbb-3(b)(1), unless the authorization is terminated or revoked.     Resp Syncytial Virus by PCR NEGATIVE NEGATIVE    Comment: (NOTE) Fact Sheet for Patients: BloggerCourse.com  Fact Sheet for Healthcare Providers: SeriousBroker.it  This test is not yet approved or cleared by the [place] FDA and has been authorized for detection and/or diagnosis of SARS-CoV-2 by FDA under an Emergency Use Authorization (EUA). This EUA will remain in effect (meaning this test can be used) for the duration of the COVID-19 declaration under Section 564(b)(1) of the Act, 21 U.S.C. section 360bbb-3(b)(1), unless the authorization is terminated or revoked.  Performed at [facility], [address], [place], [place] [address]   Brain natriuretic peptide     Status: Abnormal   Collection Time: 03/04/23  4:07 PM  Result Value Ref Range   B Natriuretic Peptide 325.0 (H) 0.0 - 100.0 pg/mL    Comment: Performed at [facility], [address], [place], [place] [address]  Troponin I (High Sensitivity)     Status: Abnormal   Collection Time: 03/04/23  4:07 PM  Result Value Ref Range   Troponin I (High Sensitivity) 18 (H) <18 ng/L    Comment: (NOTE) Elevated high sensitivity troponin I (hsTnI) values and significant  changes across serial measurements may suggest ACS but many other  chronic and acute conditions are known to elevate hsTnI results.  Refer to the  "Links" section for chest pain algorithms and additional  guidance. Performed at [facility], [address], [place], [place] [address]   Troponin I (High Sensitivity)     Status: Abnormal   Collection Time: 03/04/23  5:17 PM  Result Value Ref Range   Troponin I (High Sensitivity) 18 (H) <18 ng/L    Comment: (NOTE) Elevated high sensitivity troponin I (hsTnI) values and significant  changes across serial measurements may suggest ACS but many other  chronic and acute  conditions are known to elevate hsTnI results.  Refer to the "Links" section for chest pain algorithms and additional  guidance. Performed at [facility], [address] [place], [place] [address]   Lactic acid, plasma     Status: None   Collection Time: 03/04/23  7:49 PM  Result Value Ref Range   Lactic Acid, Venous 1.9 0.5 - 1.9 mmol/L    Comment: Performed at [facility], [address] [place], [place] [address]  Surgical pcr screen     Status: None   Collection Time: 03/05/23  5:49 AM   Specimen: Nasal Mucosa; Nasal Swab  Result Value Ref Range   MRSA, PCR NEGATIVE NEGATIVE   Staphylococcus aureus NEGATIVE NEGATIVE    Comment: (NOTE) The Xpert SA Assay (FDA approved for NASAL specimens in patients 53 years of age and older), is one component of a comprehensive surveillance program. It is not intended to diagnose infection nor to guide or monitor treatment. Performed at [facility], [address] [place], [place] [address]   Type and screen [facility]     Status: None   Collection Time: 03/05/23  6:49 AM  Result Value Ref Range   ABO/RH(D) O POS    Antibody Screen NEG    Sample Expiration      03/08/2023,2359 Performed at [facility], [address], [place], [place] [address]     CT KNEE RIGHT WO CONTRAST Result Date: 03/04/2023 CLINICAL DATA:  Larey Seat, right knee fracture EXAM: CT OF THE RIGHT KNEE WITHOUT CONTRAST TECHNIQUE: Multidetector CT  imaging of the right knee was performed according to the standard protocol. Multiplanar CT image reconstructions were also generated. RADIATION DOSE REDUCTION: This exam was performed according to the departmental dose-optimization program which includes automated exposure control, adjustment of the mA and/or kV according to patient size and/or use of iterative reconstruction technique. COMPARISON:  03/04/2023 FINDINGS: Bones/Joint/Cartilage There is an oblique comminuted periprosthetic distal right femur fracture. The distal fracture fragment including the femoral component of the right knee prosthesis is displaced 4 cm distally and 6.5 cm laterally. In addition, there is 4 cm of proximal migration of the distal fracture fragment relative to the femoral diaphysis. There is slight lateral tilt of the patellar component, without frank dislocation. Proximal tibia and fibula are unremarkable. Ligaments Suboptimally assessed by CT. Muscles and Tendons Diffuse muscular atrophy. Soft tissues Soft tissue swelling surrounding the distal femoral fracture site within the deep compartment of the distal right thigh. The superficial soft tissues are unremarkable. There is no fluid collection or hematoma. Reconstructed images demonstrate no additional findings. IMPRESSION: 1. Comminuted periprosthetic distal right femur fracture, with dorsal, lateral, and proximal migration of the distal fracture fragment as described above. 2. Marked soft tissue swelling surrounding the femoral fracture site. No fluid collection or hematoma. Electronically Signed   By: Sharlet Salina M.D.   On: 03/04/2023 19:40   CT Head Wo Contrast Result Date: 03/04/2023 CLINICAL DATA:  Multiple falls. EXAM: CT HEAD WITHOUT CONTRAST TECHNIQUE: Contiguous axial images were obtained from the base of the skull through the vertex without intravenous contrast. RADIATION DOSE REDUCTION: This exam was performed according to the departmental dose-optimization  program which includes automated exposure control, adjustment of the mA and/or kV according to patient size and/or use of iterative reconstruction technique. COMPARISON:  December 15, 2020 FINDINGS: Brain: There is mild cerebral atrophy with widening of the extra-axial spaces and ventricular dilatation. There are  areas of decreased attenuation within the white matter tracts of the supratentorial brain, consistent with microvascular disease changes. Vascular: No hyperdense vessel or unexpected calcification. Skull: Normal. Negative for fracture or focal lesion. Sinuses/Orbits: There is mild bilateral maxillary sinus and mild bilateral ethmoid sinus mucosal thickening. Other: None. IMPRESSION: 1. No acute intracranial abnormality. 2. Generalized cerebral atrophy with widening of the extra-axial spaces and ventricular dilatation. 3. Mild bilateral maxillary sinus and mild bilateral ethmoid sinus disease. Electronically Signed   By: Aram Candela M.D.   On: 03/04/2023 17:18   CT Cervical Spine Wo Contrast Result Date: 03/04/2023 CLINICAL DATA:  Neck trauma. Fall at home last night and this morning. EXAM: CT CERVICAL SPINE WITHOUT CONTRAST TECHNIQUE: Multidetector CT imaging of the cervical spine was performed without intravenous contrast. Multiplanar CT image reconstructions were also generated. RADIATION DOSE REDUCTION: This exam was performed according to the departmental dose-optimization program which includes automated exposure control, adjustment of the mA and/or kV according to patient size and/or use of iterative reconstruction technique. COMPARISON:  CT cervical spine 07/24/2020. FINDINGS: Alignment: Stable normal alignment. Skull base and vertebrae: No evidence of acute fracture or traumatic subluxation. Multilevel spondylosis with probable interbody ankylosis at C6-7. Soft tissues and spinal canal: No prevertebral fluid or swelling. No visible canal hematoma. Disc levels: Multilevel spondylosis with  disc space narrowing, endplate osteophytes and facet hypertrophy. There is the potential for spinal stenosis at several levels, greatest at C6-7, grossly stable. Mild multilevel osseous foraminal narrowing. Upper chest: Clear lung apices. Bilateral carotid atherosclerosis. Grossly stable 1.3 cm left thyroid nodule on image 76/6 for which no specific follow-up imaging is recommended. Other: None. IMPRESSION: 1. No evidence of acute cervical spine fracture, traumatic subluxation or static signs of instability. 2. Multilevel cervical spondylosis, grossly stable. Electronically Signed   By: Carey Bullocks M.D.   On: 03/04/2023 17:17   DG FEMUR, MIN 2 VIEWS RIGHT Result Date: 03/04/2023 CLINICAL DATA:  Fall with knee pain. EXAM: RIGHT FEMUR 2 VIEWS COMPARISON:  Knee radiographs dated 07/24/2020. FINDINGS: The patient is status post a knee arthroplasty. There is a displaced (1 shaft with) and overriding (approximately 8 cm) fracture of the distal femur. IMPRESSION: Displaced and overriding fracture of the distal femur. Electronically Signed   By: Romona Curls M.D.   On: 03/04/2023 16:17   DG Chest 2 View Result Date: 03/04/2023 CLINICAL DATA:  Cough, coarse breath sounds. EXAM: CHEST - 2 VIEW COMPARISON:  Chest radiograph dated 10/08/2013 FINDINGS: The heart is enlarged. Minimal bibasilar atelectasis/airspace disease. No significant pleural effusion or pneumothorax. Degenerative changes are seen in the spine. IMPRESSION: Minimal bibasilar atelectasis/airspace disease. Electronically Signed   By: Romona Curls M.D.   On: 03/04/2023 16:13   DG Pelvis 1-2 Views Result Date: 03/04/2023 CLINICAL DATA:  Fall with pelvic pain. EXAM: PELVIS - 1-2 VIEW COMPARISON:  Pelvic radiographs dated 10/07/2013. FINDINGS: There is no evidence of pelvic fracture or diastasis. 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 Severe degenerative changes are seen in the lumbar spine. Stool overlies the rectum. IMPRESSION: 1. No acute  osseous injury. Electronically Signed   By: Romona Curls M.D.   On: 03/04/2023 16:11   DG Knee 1-2 Views Right Result Date: 03/04/2023 CLINICAL DATA:  Fall with knee pain. EXAM: RIGHT KNEE - 1-2 VIEW COMPARISON:  Knee radiographs dated 07/24/2020 FINDINGS: The patient is status post a knee arthroplasty. There is a displaced (1 shaft width) and overriding (approximately 8 cm) fracture of the distal femur. No knee joint  dislocation, however there likely is a knee joint effusion. IMPRESSION: Displaced and overriding fracture of the distal femur. Electronically Signed   By: Romona Curls M.D.   On: 03/04/2023 16:10    Review of Systems  HENT:  Negative for ear discharge, ear pain, hearing loss and tinnitus.   Eyes:  Negative for photophobia and pain.  Respiratory:  Negative for cough and shortness of breath.   Cardiovascular:  Negative for chest pain.  Gastrointestinal:  Negative for abdominal pain, nausea and vomiting.  Genitourinary:  Negative for dysuria, flank pain, frequency and urgency.  Musculoskeletal:  Positive for arthralgias (Right knee). Negative for back pain, myalgias and neck pain.  Neurological:  Negative for dizziness and headaches.  Hematological:  Does not bruise/bleed easily.  Psychiatric/Behavioral:  The patient is not nervous/anxious.    Blood pressure 125/64, pulse 70, temperature (P) 98.4 F (36.9 C), temperature source Oral, resp. rate 18, height 5\' 9"  (1.753 m), weight 106.6 kg, SpO2 92%. Physical Exam Constitutional:      General: She is not in acute distress.    Appearance: She is well-developed. She is not diaphoretic.  HENT:     Head: Normocephalic and atraumatic.  Eyes:     General: No scleral icterus.       Right eye: No discharge.        Left eye: No discharge.     Conjunctiva/sclera: Conjunctivae normal.  Cardiovascular:     Rate and Rhythm: Normal rate and regular rhythm.  Pulmonary:     Effort: Pulmonary effort is normal. No respiratory distress.   Musculoskeletal:     Cervical back: Normal range of motion.     Comments: RLE No traumatic wounds, ecchymosis, or rash  KI in place  No ankle effusion  Sens DPN, SPN, TN intact  Motor EHL, ext, flex, evers 5/5  DP 2+, PT 2+, No significant edema  Skin:    General: Skin is warm and dry.  Neurological:     Mental Status: She is alert.  Psychiatric:        Mood and Affect: Mood normal.        Behavior: Behavior normal.     Assessment/Plan: Right distal femur fx -- Plan ORIF today with Dr. Jena Gauss. Please keep NPO. Multiple medical problems including hypertension, CKD 4, anxiety, hallucination, Alzheimer's dementia, anemia, deaf, mute, OAB, and obesity -- per primary service    Freeman Caldron, PA-C Orthopedic Surgery [phone] 03/05/2023, 9:11 AM

## 2023-03-05 NOTE — H&P (View-Only) (Signed)
 Reason for Consult:Right distal femur fx Referring Physician: Rosalee Kaufman Time called: [id number] Time at bedside: 0903   Sharon Ramos is an 83 y.o. female.  HPI: Sharon Ramos was in the bathroom yesterday and slipped and fell. She had immediate right knee pain and could not get up. She was brought to the ED where x-rays showed a periprosthetic distal femur fx and orthopedic surgery was consulted. Due to the complexity of the fx orthopedic trauma consultation was requested. She lives at home with her daughter and uses a rollator to ambulate.  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 No pertinent family history.  Social History:  reports that she has never smoked. She has never used smokeless tobacco. She reports current alcohol use of about 21.0 standard drinks of alcohol per week. She reports that she does not use drugs.  Allergies: No Known Allergies  Medications: I have reviewed the patient's current medications.  Results for orders placed or performed during the hospital encounter of 03/04/23 (from the past 48 hours)  CBC with Differential     Status: Abnormal   Collection Time: 03/04/23  4:07 PM  Result Value Ref Range   WBC 9.6 4.0 - 10.5 K/uL   RBC 3.71 (L) 3.87 - 5.11 MIL/uL   Hemoglobin 10.7 (L) 12.0 - 15.0 g/dL   HCT 11.9 (L) 14.7 - 82.9 %   MCV 94.3 80.0 - 100.0 fL   MCH 28.8 26.0 - 34.0 pg   MCHC 30.6 30.0 - 36.0 g/dL   RDW 56.2 (H) 13.0 - 86.5 %   Platelets 118 (L) 150 - 400 K/uL   nRBC 0.0 0.0 - 0.2 %   Neutrophils Relative % 80 %   Neutro Abs 7.7 1.7 - 7.7 K/uL   Lymphocytes Relative 8 %   Lymphs Abs 0.8 0.7 - 4.0 K/uL   Monocytes Relative 11 %   Monocytes Absolute 1.0 0.1 - 1.0 K/uL   Eosinophils Relative 0 %   Eosinophils Absolute 0.0 0.0 - 0.5 K/uL   Basophils Relative 0 %   Basophils Absolute 0.0 0.0 - 0.1 K/uL    Immature Granulocytes 1 %   Abs Immature Granulocytes 0.06 0.00 - 0.07 K/uL    Comment: Performed at [facility], [address] [place], [place] [address]  Comprehensive metabolic panel     Status: Abnormal   Collection Time: 03/04/23  4:07 PM  Result Value Ref Range   Sodium 140 135 - 145 mmol/L   Potassium 4.4 3.5 - 5.1 mmol/L   Chloride 105 98 - 111 mmol/L   CO2 25 22 - 32 mmol/L   Glucose, Bld 115 (H) 70 - 99 mg/dL    Comment: Glucose reference range applies only to samples taken after fasting for at least 8 hours.   BUN 26 (H) 8 - 23 mg/dL   Creatinine, Ser 6.29 (H) 0.44 - 1.00 mg/dL   Calcium 8.6 (L) 8.9 - 10.3 mg/dL   Total Protein 6.1 (L) 6.5 - 8.1 g/dL   Albumin 3.2 (L) 3.5 - 5.0 g/dL   AST 23 15 - 41 U/L   ALT 10 0 - 44 U/L   Alkaline Phosphatase 46 38 - 126 U/L   Total Bilirubin 0.8 0.0 - 1.2 mg/dL   GFR, Estimated 24 (L) >60 mL/min    Comment: (  NOTE) Calculated using the CKD-EPI Creatinine Equation (2021)    Anion gap 10 5 - 15    Comment: Performed at [facility], [address] [place], [place] [address]  Lactic acid, plasma     Status: None   Collection Time: 03/04/23  4:07 PM  Result Value Ref Range   Lactic Acid, Venous 1.2 0.5 - 1.9 mmol/L    Comment: Performed at [facility], [address], [place], [place] [address]  Resp panel by RT-PCR (RSV, Flu A&B, Covid) Anterior Nasal Swab     Status: None   Collection Time: 03/04/23  4:07 PM   Specimen: Anterior Nasal Swab  Result Value Ref Range   SARS Coronavirus 2 by RT PCR NEGATIVE NEGATIVE   Influenza A by PCR NEGATIVE NEGATIVE   Influenza B by PCR NEGATIVE NEGATIVE    Comment: (NOTE) The Xpert Xpress SARS-CoV-2/FLU/RSV plus assay is intended as an aid in the diagnosis of influenza from Nasopharyngeal swab specimens and should not be used as a sole basis for treatment. Nasal washings and aspirates are unacceptable for Xpert Xpress SARS-CoV-2/FLU/RSV testing.  Fact Sheet for  Patients: BloggerCourse.com  Fact Sheet for Healthcare Providers: SeriousBroker.it  This test is not yet approved or cleared by the [place] FDA and has been authorized for detection and/or diagnosis of SARS-CoV-2 by FDA under an Emergency Use Authorization (EUA). This EUA will remain in effect (meaning this test can be used) for the duration of the COVID-19 declaration under Section 564(b)(1) of the Act, 21 U.S.C. section 360bbb-3(b)(1), unless the authorization is terminated or revoked.     Resp Syncytial Virus by PCR NEGATIVE NEGATIVE    Comment: (NOTE) Fact Sheet for Patients: BloggerCourse.com  Fact Sheet for Healthcare Providers: SeriousBroker.it  This test is not yet approved or cleared by the [place] FDA and has been authorized for detection and/or diagnosis of SARS-CoV-2 by FDA under an Emergency Use Authorization (EUA). This EUA will remain in effect (meaning this test can be used) for the duration of the COVID-19 declaration under Section 564(b)(1) of the Act, 21 U.S.C. section 360bbb-3(b)(1), unless the authorization is terminated or revoked.  Performed at [facility], [address], [place], [place] [address]   Brain natriuretic peptide     Status: Abnormal   Collection Time: 03/04/23  4:07 PM  Result Value Ref Range   B Natriuretic Peptide 325.0 (H) 0.0 - 100.0 pg/mL    Comment: Performed at [facility], [address], [place], [place] [address]  Troponin I (High Sensitivity)     Status: Abnormal   Collection Time: 03/04/23  4:07 PM  Result Value Ref Range   Troponin I (High Sensitivity) 18 (H) <18 ng/L    Comment: (NOTE) Elevated high sensitivity troponin I (hsTnI) values and significant  changes across serial measurements may suggest ACS but many other  chronic and acute conditions are known to elevate hsTnI results.  Refer to the  "Links" section for chest pain algorithms and additional  guidance. Performed at [facility], [address], [place], [place] [address]   Troponin I (High Sensitivity)     Status: Abnormal   Collection Time: 03/04/23  5:17 PM  Result Value Ref Range   Troponin I (High Sensitivity) 18 (H) <18 ng/L    Comment: (NOTE) Elevated high sensitivity troponin I (hsTnI) values and significant  changes across serial measurements may suggest ACS but many other  chronic and acute  conditions are known to elevate hsTnI results.  Refer to the "Links" section for chest pain algorithms and additional  guidance. Performed at [facility], [address] [place] [place], [place] [address]   Lactic acid, plasma     Status: None   Collection Time: 03/04/23  7:49 PM  Result Value Ref Range   Lactic Acid, Venous 1.9 0.5 - 1.9 mmol/L    Comment: Performed at [facility], [address] [place], [place] [address]  Surgical pcr screen     Status: None   Collection Time: 03/05/23  5:49 AM   Specimen: Nasal Mucosa; Nasal Swab  Result Value Ref Range   MRSA, PCR NEGATIVE NEGATIVE   Staphylococcus aureus NEGATIVE NEGATIVE    Comment: (NOTE) The Xpert SA Assay (FDA approved for NASAL specimens in patients 53 years of age and older), is one component of a comprehensive surveillance program. It is not intended to diagnose infection nor to guide or monitor treatment. Performed at [facility], [address] [place], [place] [address]   Type and screen [facility]     Status: None   Collection Time: 03/05/23  6:49 AM  Result Value Ref Range   ABO/RH(D) O POS    Antibody Screen NEG    Sample Expiration      03/08/2023,2359 Performed at [facility], [address], [place], [place] [address]     CT KNEE RIGHT WO CONTRAST Result Date: 03/04/2023 CLINICAL DATA:  Larey Seat, right knee fracture EXAM: CT OF THE RIGHT KNEE WITHOUT CONTRAST TECHNIQUE: Multidetector CT  imaging of the right knee was performed according to the standard protocol. Multiplanar CT image reconstructions were also generated. RADIATION DOSE REDUCTION: This exam was performed according to the departmental dose-optimization program which includes automated exposure control, adjustment of the mA and/or kV according to patient size and/or use of iterative reconstruction technique. COMPARISON:  03/04/2023 FINDINGS: Bones/Joint/Cartilage There is an oblique comminuted periprosthetic distal right femur fracture. The distal fracture fragment including the femoral component of the right knee prosthesis is displaced 4 cm distally and 6.5 cm laterally. In addition, there is 4 cm of proximal migration of the distal fracture fragment relative to the femoral diaphysis. There is slight lateral tilt of the patellar component, without frank dislocation. Proximal tibia and fibula are unremarkable. Ligaments Suboptimally assessed by CT. Muscles and Tendons Diffuse muscular atrophy. Soft tissues Soft tissue swelling surrounding the distal femoral fracture site within the deep compartment of the distal right thigh. The superficial soft tissues are unremarkable. There is no fluid collection or hematoma. Reconstructed images demonstrate no additional findings. IMPRESSION: 1. Comminuted periprosthetic distal right femur fracture, with dorsal, lateral, and proximal migration of the distal fracture fragment as described above. 2. Marked soft tissue swelling surrounding the femoral fracture site. No fluid collection or hematoma. Electronically Signed   By: Sharlet Salina M.D.   On: 03/04/2023 19:40   CT Head Wo Contrast Result Date: 03/04/2023 CLINICAL DATA:  Multiple falls. EXAM: CT HEAD WITHOUT CONTRAST TECHNIQUE: Contiguous axial images were obtained from the base of the skull through the vertex without intravenous contrast. RADIATION DOSE REDUCTION: This exam was performed according to the departmental dose-optimization  program which includes automated exposure control, adjustment of the mA and/or kV according to patient size and/or use of iterative reconstruction technique. COMPARISON:  December 15, 2020 FINDINGS: Brain: There is mild cerebral atrophy with widening of the extra-axial spaces and ventricular dilatation. There are  areas of decreased attenuation within the white matter tracts of the supratentorial brain, consistent with microvascular disease changes. Vascular: No hyperdense vessel or unexpected calcification. Skull: Normal. Negative for fracture or focal lesion. Sinuses/Orbits: There is mild bilateral maxillary sinus and mild bilateral ethmoid sinus mucosal thickening. Other: None. IMPRESSION: 1. No acute intracranial abnormality. 2. Generalized cerebral atrophy with widening of the extra-axial spaces and ventricular dilatation. 3. Mild bilateral maxillary sinus and mild bilateral ethmoid sinus disease. Electronically Signed   By: Aram Candela M.D.   On: 03/04/2023 17:18   CT Cervical Spine Wo Contrast Result Date: 03/04/2023 CLINICAL DATA:  Neck trauma. Fall at home last night and this morning. EXAM: CT CERVICAL SPINE WITHOUT CONTRAST TECHNIQUE: Multidetector CT imaging of the cervical spine was performed without intravenous contrast. Multiplanar CT image reconstructions were also generated. RADIATION DOSE REDUCTION: This exam was performed according to the departmental dose-optimization program which includes automated exposure control, adjustment of the mA and/or kV according to patient size and/or use of iterative reconstruction technique. COMPARISON:  CT cervical spine 07/24/2020. FINDINGS: Alignment: Stable normal alignment. Skull base and vertebrae: No evidence of acute fracture or traumatic subluxation. Multilevel spondylosis with probable interbody ankylosis at C6-7. Soft tissues and spinal canal: No prevertebral fluid or swelling. No visible canal hematoma. Disc levels: Multilevel spondylosis with  disc space narrowing, endplate osteophytes and facet hypertrophy. There is the potential for spinal stenosis at several levels, greatest at C6-7, grossly stable. Mild multilevel osseous foraminal narrowing. Upper chest: Clear lung apices. Bilateral carotid atherosclerosis. Grossly stable 1.3 cm left thyroid nodule on image 76/6 for which no specific follow-up imaging is recommended. Other: None. IMPRESSION: 1. No evidence of acute cervical spine fracture, traumatic subluxation or static signs of instability. 2. Multilevel cervical spondylosis, grossly stable. Electronically Signed   By: Carey Bullocks M.D.   On: 03/04/2023 17:17   DG FEMUR, MIN 2 VIEWS RIGHT Result Date: 03/04/2023 CLINICAL DATA:  Fall with knee pain. EXAM: RIGHT FEMUR 2 VIEWS COMPARISON:  Knee radiographs dated 07/24/2020. FINDINGS: The patient is status post a knee arthroplasty. There is a displaced (1 shaft with) and overriding (approximately 8 cm) fracture of the distal femur. IMPRESSION: Displaced and overriding fracture of the distal femur. Electronically Signed   By: Romona Curls M.D.   On: 03/04/2023 16:17   DG Chest 2 View Result Date: 03/04/2023 CLINICAL DATA:  Cough, coarse breath sounds. EXAM: CHEST - 2 VIEW COMPARISON:  Chest radiograph dated 10/08/2013 FINDINGS: The heart is enlarged. Minimal bibasilar atelectasis/airspace disease. No significant pleural effusion or pneumothorax. Degenerative changes are seen in the spine. IMPRESSION: Minimal bibasilar atelectasis/airspace disease. Electronically Signed   By: Romona Curls M.D.   On: 03/04/2023 16:13   DG Pelvis 1-2 Views Result Date: 03/04/2023 CLINICAL DATA:  Fall with pelvic pain. EXAM: PELVIS - 1-2 VIEW COMPARISON:  Pelvic radiographs dated 10/07/2013. FINDINGS: There is no evidence of pelvic fracture or diastasis. 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 Severe degenerative changes are seen in the lumbar spine. Stool overlies the rectum. IMPRESSION: 1. No acute  osseous injury. Electronically Signed   By: Romona Curls M.D.   On: 03/04/2023 16:11   DG Knee 1-2 Views Right Result Date: 03/04/2023 CLINICAL DATA:  Fall with knee pain. EXAM: RIGHT KNEE - 1-2 VIEW COMPARISON:  Knee radiographs dated 07/24/2020 FINDINGS: The patient is status post a knee arthroplasty. There is a displaced (1 shaft width) and overriding (approximately 8 cm) fracture of the distal femur. No knee joint  dislocation, however there likely is a knee joint effusion. IMPRESSION: Displaced and overriding fracture of the distal femur. Electronically Signed   By: Romona Curls M.D.   On: 03/04/2023 16:10    Review of Systems  HENT:  Negative for ear discharge, ear pain, hearing loss and tinnitus.   Eyes:  Negative for photophobia and pain.  Respiratory:  Negative for cough and shortness of breath.   Cardiovascular:  Negative for chest pain.  Gastrointestinal:  Negative for abdominal pain, nausea and vomiting.  Genitourinary:  Negative for dysuria, flank pain, frequency and urgency.  Musculoskeletal:  Positive for arthralgias (Right knee). Negative for back pain, myalgias and neck pain.  Neurological:  Negative for dizziness and headaches.  Hematological:  Does not bruise/bleed easily.  Psychiatric/Behavioral:  The patient is not nervous/anxious.    Blood pressure 125/64, pulse 70, temperature (P) 98.4 F (36.9 C), temperature source Oral, resp. rate 18, height 5\' 9"  (1.753 m), weight 106.6 kg, SpO2 92%. Physical Exam Constitutional:      General: She is not in acute distress.    Appearance: She is well-developed. She is not diaphoretic.  HENT:     Head: Normocephalic and atraumatic.  Eyes:     General: No scleral icterus.       Right eye: No discharge.        Left eye: No discharge.     Conjunctiva/sclera: Conjunctivae normal.  Cardiovascular:     Rate and Rhythm: Normal rate and regular rhythm.  Pulmonary:     Effort: Pulmonary effort is normal. No respiratory distress.   Musculoskeletal:     Cervical back: Normal range of motion.     Comments: RLE No traumatic wounds, ecchymosis, or rash  KI in place  No ankle effusion  Sens DPN, SPN, TN intact  Motor EHL, ext, flex, evers 5/5  DP 2+, PT 2+, No significant edema  Skin:    General: Skin is warm and dry.  Neurological:     Mental Status: She is alert.  Psychiatric:        Mood and Affect: Mood normal.        Behavior: Behavior normal.     Assessment/Plan: Right distal femur fx -- Plan ORIF today with Dr. Jena Gauss. Please keep NPO. Multiple medical problems including hypertension, CKD 4, anxiety, hallucination, Alzheimer's dementia, anemia, deaf, mute, OAB, and obesity -- per primary service    Freeman Caldron, PA-C Orthopedic Surgery [phone] 03/05/2023, 9:11 AM

## 2023-03-05 NOTE — Op Note (Signed)
 Orthopaedic Surgery Operative Note (CSN: [id number] ) Date of Surgery: 03/05/2023  Admit Date: 03/04/2023   Diagnoses: Pre-Op Diagnoses: Right supracondylar distal femur fracture  Post-Op Diagnosis: Same  Procedures: CPT 27511-Open reduction internal fixation of right supracondylar distal femur fracture  Surgeons : Primary: Roby Lofts, MD  Assistant: Thyra Breed, PA-C  Location: OR 3   Anesthesia: General   Antibiotics: Ancef 2g preop with 1 gm vancomycin powder placed topically   Tourniquet time: None    Estimated Blood Loss: 100 mL  Complications:* No complications entered in OR log *   Specimens:* No specimens in log *   Implants: Implant Name Type Inv. Item Serial No. Manufacturer Lot No. LRB No. Used Action  PLATE FEM DIST NCB PP - [id number] Plate PLATE FEM DIST NCB PP  [employer](ORTH,TRAU,BIO,SG)  Right 1 Implanted  CAP LOCK NCB - [id number] Cap CAP LOCK NCB  [employer](ORTH,TRAU,BIO,SG)  Right 7 Implanted  SCREW NCB 5.0X85MM - [id number] Screw SCREW NCB 5.0X85MM  [employer](ORTH,TRAU,BIO,SG)  Right 5 Implanted  SCREW NCB 5.0X46MM - [id number] Screw SCREW NCB 5.0X46MM  [employer](ORTH,TRAU,BIO,SG)  Right 1 Implanted  SCREW CORT NCB SELFTAP 5.0X42 - [id number] Screw SCREW CORT NCB SELFTAP 5.0X42  [employer](ORTH,TRAU,BIO,SG)  Right 2 Implanted  SCREW NCB 4.0MX42M - [id number] Screw SCREW NCB 4.0MX42M  [employer](ORTH,TRAU,BIO,SG)  Right 1 Implanted     Indications for Surgery: 83 year old female who sustained a ground-level fall with a right supracondylar periprosthetic distal femur fracture.  Due to the unstable nature of her injury I recommend proceeding with open duction internal fixation.  Risks and benefits were discussed with the patient.  Risks included but not limited to bleeding, infection, malunion, nonunion, hardware failure, hardware rotation, nerve and blood vessel injury, DVT, even possibility anesthetic complications.  They  agreed to proceed with surgery and consent was obtained.  Operative Findings: Open reduction internal fixation of right supracondylar distal femur fracture treated with [employer] NCB distal femoral locking plate  Procedure: The patient was identified in the preoperative holding area. Consent was confirmed with the patient and their family and all questions were answered. The operative extremity was marked after confirmation with the patient. she was then brought back to the operating room by our anesthesia colleagues.  She was placed under general anesthetic and carefully transferred over to radiolucent flattop table.  A bump was placed under her operative hip.  The right lower extremity was then prepped and draped in usual sterile fashion.  A timeout was performed to verify the patient, the procedure, and the extremity.  Preoperative antibiotics were dosed.  The hip and knee were flexed over a triangle and fluoroscopic imaging showed the unstable nature of her injury.  A lateral approach to the distal femur was made and carried down through skin and subcutaneous tissue.  I incised through the IT band to expose the lateral condyle of the femur.  I then used a bone hook over the medial aspect of the proximal segment to bring it over as it was significantly displaced.  Once I had decent reduction I then placed a 12 hole [employer] NCB distal femoral locking plate and slid this submuscularly along the lateral cortex of the femur.  I then held it distally with a 2.0 mm guidewire.  I then percutaneously placed a 3.3 mm drill bit proximally to align the proximal portion of the plate.  This was while my assistant was pulling traction to align the fracture appropriately.  I  then drilled and placed a 5.0 millimeter screws distally to bring the plate flush to bone.  I then percutaneously placed 5.0 millimeter screws into the femoral shaft to correct the coronal alignment.  I remove the most proximal drill bit  and placed a 4.0 millimeter screw.  Then returned to the distal segment and proceeded to place a total of 5 point 5.0 millimeter screws.  Locking caps were placed on the middle 2 screws in the femoral shaft and all 5 distal screws.  Targeting arm was removed and final fluoroscopic imaging was obtained.  The incisions were irrigated and closed with 0 Vicryl, 2-0 Monocryl and 3-0 Monocryl with Dermabond.  Sterile dressings were applied.  The patient was then awoke from anesthesia and taken to the PACU in stable condition.  Post Op Plan/Instructions: The patient will be weightbearing as tolerated to the right lower extremity.  Have unrestricted range of motion of the knee.  She will receive postoperative Ancef.  Will place her on Lovenox for DVT prophylaxis and discharged on an oral DOAC.  Will have her mobilize with physical and Occupational Therapy.  I was present and performed the entire surgery.  Thyra Breed, PA-C did assist me throughout the case. An assistant was necessary given the difficulty in approach, maintenance of reduction and ability to instrument the fracture.   Truitt Merle, MD [facility]

## 2023-03-05 NOTE — Anesthesia Preprocedure Evaluation (Signed)
 Anesthesia Evaluation  Patient identified by MRN, date of birth, ID band Patient awake    Reviewed: Allergy & Precautions, NPO status , Patient's Chart, lab work & pertinent test results  Airway Mallampati: III  TM Distance: >3 FB Neck ROM: Limited    Dental  (+) Dental Advisory Given   Pulmonary neg pulmonary ROS   breath sounds clear to auscultation       Cardiovascular hypertension, Pt. on medications  Rhythm:Regular Rate:Normal     Neuro/Psych negative neurological ROS     GI/Hepatic negative GI ROS, Neg liver ROS,,,  Endo/Other  negative endocrine ROS    Renal/GU CRFRenal disease     Musculoskeletal   Abdominal   Peds  Hematology  (+) Blood dyscrasia, anemia   Anesthesia Other Findings   Reproductive/Obstetrics                             Anesthesia Physical Anesthesia Plan  ASA: 3  Anesthesia Plan: General   Post-op Pain Management: Tylenol PO (pre-op)*   Induction: Intravenous  PONV Risk Score and Plan: 3 and Dexamethasone, Ondansetron and Treatment may vary due to age or medical condition  Airway Management Planned: Oral ETT  Additional Equipment:   Intra-op Plan:   Post-operative Plan: Extubation in OR  Informed Consent: I have reviewed the patients History and Physical, chart, labs and discussed the procedure including the risks, benefits and alternatives for the proposed anesthesia with the patient or authorized representative who has indicated his/her understanding and acceptance.     Dental advisory given  Plan Discussed with: CRNA  Anesthesia Plan Comments:        Anesthesia Quick Evaluation

## 2023-03-05 NOTE — Discharge Instructions (Addendum)
 [facility] Discharge Instructions   General Discharge Instructions  WEIGHT BEARING STATUS: Weightbearing as tolerated  RANGE OF MOTION/ACTIVITY: Ok for unrestricted range of motion of the hip and knee   Wound Care: You may remove your surgical dressing on post op day #2, (Wednesday 03/07/23). Incisions can be left open to air if there is no drainage. Once the incision is completely dry and without drainage, it may be left open to air out.  Showering may begin post op day #3, (Thursday 03/08/23).  Clean incision gently with soap and water.  DVT/PE prophylaxis:  Eliquis 2.5 mg twice daily x 30 days  Diet: as you were eating previously.  Can use over the counter stool softeners and bowel preparations, such as Miralax, to help with bowel movements.  Narcotics can be constipating.  Be sure to drink plenty of fluids  PAIN MEDICATION USE AND EXPECTATIONS  You have likely been given narcotic medications to help control your pain.  After a traumatic event that results in an fracture (broken bone) with or without surgery, it is ok to use narcotic pain medications to help control one's pain.  We understand that everyone responds to pain differently and each individual patient will be evaluated on a regular basis for the continued need for narcotic medications. Ideally, narcotic medication use should last no more than 6-8 weeks (coinciding with fracture healing).   As a patient it is your responsibility as well to monitor narcotic medication use and report the amount and frequency you use these medications when you come to your office visit.   We would also advise that if you are using narcotic medications, you should take a dose prior to therapy to maximize you participation.  IF YOU ARE ON NARCOTIC MEDICATIONS IT IS NOT PERMISSIBLE TO OPERATE A MOTOR VEHICLE (MOTORCYCLE/CAR/TRUCK/MOPED) OR HEAVY MACHINERY DO NOT MIX NARCOTICS WITH OTHER CNS (CENTRAL NERVOUS SYSTEM) DEPRESSANTS SUCH AS  ALCOHOL   STOP SMOKING OR USING NICOTINE PRODUCTS!!!!  As discussed nicotine severely impairs your body's ability to heal surgical and traumatic wounds but also impairs bone healing.  Wounds and bone heal by forming microscopic blood vessels (angiogenesis) and nicotine is a vasoconstrictor (essentially, shrinks blood vessels).  Therefore, if vasoconstriction occurs to these microscopic blood vessels they essentially disappear and are unable to deliver necessary nutrients to the healing tissue.  This is one modifiable factor that you can do to dramatically increase your chances of healing your injury.    (This means no smoking, no nicotine gum, patches, etc)  DO NOT USE NONSTEROIDAL ANTI-INFLAMMATORY DRUGS (NSAID'S)  Using products such as Advil (ibuprofen), Aleve (naproxen), Motrin (ibuprofen) for additional pain control during fracture healing can delay and/or prevent the healing response.  If you would like to take over the counter (OTC) medication, Tylenol (acetaminophen) is ok.  However, some narcotic medications that are given for pain control contain acetaminophen as well. Therefore, you should not exceed more than 4000 mg of tylenol in a day if you do not have liver disease.  Also note that there are may OTC medicines, such as cold medicines and allergy medicines that my contain tylenol as well.  If you have any questions about medications and/or interactions please ask your doctor/PA or your pharmacist.      ICE AND ELEVATE INJURED/OPERATIVE EXTREMITY  Using ice and elevating the injured extremity above your heart can help with swelling and pain control.  Icing in a pulsatile fashion, such as 20 minutes on and 20 minutes  off, can be followed.    Do not place ice directly on skin. Make sure there is a barrier between to skin and the ice pack.    Using frozen items such as frozen peas works well as the conform nicely to the are that needs to be iced.  USE AN ACE WRAP OR TED HOSE FOR SWELLING  CONTROL  In addition to icing and elevation, Ace wraps or TED hose are used to help limit and resolve swelling.  It is recommended to use Ace wraps or TED hose until you are informed to stop.    When using Ace Wraps start the wrapping distally (farthest away from the body) and wrap proximally (closer to the body)   Example: If you had surgery on your leg or thing and you do not have a splint on, start the ace wrap at the toes and work your way up to the thigh        If you had surgery on your upper extremity and do not have a splint on, start the ace wrap at your fingers and work your way up to the upper arm  CALL THE OFFICE WITH ANY QUESTIONS OR CONCERNS: [phone]   VISIT OUR WEBSITE FOR ADDITIONAL INFORMATION: [email or web address]    Discharge Wound Care Instructions  Do NOT apply any ointments, solutions or lotions to pin sites or surgical wounds.  These prevent needed drainage and even though solutions like hydrogen peroxide kill bacteria, they also damage cells lining the pin sites that help fight infection.  Applying lotions or ointments can keep the wounds moist and can cause them to breakdown and open up as well. This can increase the risk for infection. When in doubt call the office.  If any drainage is noted, use one layer of adaptic or Mepitel, then gauze, Kerlix, and an ace wrap. - These dressing supplies should be available at local medical supply stores [facility], [facility], etc) as well as Insurance claims handler ([facility], [facility], [place], etc)  Once the incision is completely dry and without drainage, it may be left open to air out.  Showering may begin 36-48 hours later.  Cleaning gently with soap and water.  IDDSI Level 4 Pureed [place]) Nutrition Therapy  A level 4 puree diet is prescribed to patients who may have pain when chewing or swallowing or are unable to bite or chew foods. Foods in this diet should not require chewing and should fall off a spoon easily. These  foods are easy to swallow because they are pureed smooth and free of lumps.  Your registered dietitian nutritionist (RDN) can help you figure out how to include your favorite foods while following this diet. How to Test Your Food Use a fork for drip and pressure tests and a spoon for tilt test to check if your foods are safe to eat while on this diet. Cooking method and serving temperature can affect texture, so be sure to test your foods just before you begin to eat. It is important to do both the fork drip test and the spoon tilt test when evaluating pureed foods. IDDSI Fork Drip Test: Food should not have lumps, stays as a mound on fork and has very little or no flow through prongs. A small amount may flow through but it does not drip continuously. IDDSI Spoon Tilt Test: Food should hold its shape on the spoon but slide off easily with almost no food left on the spoon. You may need to gently flick the  spoon to get food to fall off and there may be a small thin film left on the spoon. Food that doesn't fall off the spoon when tilted or sticks to the spoon is too thick. IDDSI Fork Pressure Test: When fork is pressed into the food sample, tines make a pattern on the surface when fork is pulled through the food sample. The food may briefly show the fork indentation marks. To test gelled, molded, or shaped pureed foods: Confirm it is not too firm or too sticky. You will know it's too firm if the pureed food can be cut into pieces or can be easily picked up with fingers. Puree food should not need to be chewed. You will know if it's too sticky if it does not pass the spoon tilt test.  Tips Prepare foods to make them smooth, lump free, and not too firm or sticky.  Use a food processor or blender to puree foods.  Add gravy, sauce, vegetable juice, cooking water, fruit juice, milk, or half-and-half to pureed foods to prevent lumps and provide moisture.  Serve foods with thick enough liquids so that the liquid  does not run off the food or separate from the pureed solid foods. Thicken liquids to the consistency recommended by your clinician.  Strain the liquid or blend it in so the food is one consistency. Some strategies:  Drain excess milk from smooth cereal  Strain pureed fruits to remove excess fluid  Create a roux from water and flour to thicken the broth, gravy or sauce from soups, stews and casseroles. Blend/puree together to make one food product.  Monitor leftover foods while re-heating to make sure they don't form a tough outer crust that could make the food harder to eat.   Foods Recommended and Not Recommended Food lists are based on the International Dysphagia Diet Standardization Initiative (IDDSI) Framework. All foods that you eat must pass the IDDSI level 4 testing methods. The following table is not a complete list of foods recommended. Other foods may be OK to eat as long as they meet IDDSI testing requirements. Ask your RDN if you want to know the safety of other foods not included on this list. Food Group Foods Recommended Foods Not Recommended  Grains Pureed soft-cooked hot cereals with no lumps. Served without excess liquid. Soft breads, rolls, pastries, pancakes, [place] toast, muffins, donuts and bread dressing that have been pureed. Pre-gelled, soaked bread, cakes, cookies, and other grains that are consistently moist throughout without hard parts formed during sitting. Pureed, moist pasta, potatoes, and rice without lumps. Liquids/sauces do not separate from food. Any item that is not pureed or has lumps. Dry cereal, cooked cereal with lumps, cereal with seeds. Grainy, sticky, or glutinous rice. Rice that separates into individual grains when cooked or served. Pre-gelled, shaped, and molded puree foods that are too firm or sticky at serving temperature.  Protein Foods Pureed, moistened, tender protein foods that meet IDDSI Level 4 puree testing expectations: Red meat, including  beef, pork, or lamb. Poultry, including skinless chicken or [place]. Seafood, including fish (salmon, herring, and sardines), shrimp, lobster, clams, and scallops. Pureed eggs and egg substitutes. Pureed, smooth casseroles with no liquid separating from the solid; moist with incorporated sauces/gravies. Pureed, moistened soy foods, such as tofu or tempeh. Pureed, moistened meat alternatives, such as veggie burgers, and sausages based on plant protein. Pureed, smooth, moistened legumes, such as dried beans, lentils, or peas. Protein foods not pureed into smooth, lump free items. Protein foods  served with undrained thin liquids. Chicken, [place] and fish with skin on or with bones. Chunky and smooth nut seed butters, unless used in a recipe that is pureed and meets testing expectations. Whole nuts and seeds, such as peanuts and almonds; pistachios and sunflower seeds.  Dairy and Dairy Alternatives Smooth yogurt (without nuts or coconut) or pureed; and pureed cottage cheese. Whipped cream cheese, sour cream, and whipped topping used in allowed recipes and as condiments. Frozen desserts such as ice cream, sherbet, malts, and frozen yogurt if recommended by your clinician. Milk, fortified soy milk, fortified nut milk in the liquid consistency recommended by your clinician. Yogurt with lumps, seeds, fruit pieces, nuts or coconut; yogurt that is too thin, separates into liquid or is too thick. Cheeses unless pureed into allowed recipe. Frozen desserts such as ice cream, sherbet, malts, and frozen yogurt unless approved by your clinician.  Vegetables Pureed cooked tender vegetables and potatoes. If indicated, serve in a thick and smooth sauce or gravy, draining excess. There should not be thin liquid separating from food. Smooth tomato sauce without seeds. Mashed potatoes and whipped sweet potatoes without skin Vegetable juices in the liquid consistency recommended by your clinician. All raw  vegetables. Stir-fried or fried vegetables that do not puree into smooth, lump free product.  Fruit Pureed canned and cooked fruits, drained of excess juices; pureed fresh fruit if smooth and lump free with no separate of liquids. 100% fruit juice in the liquid consistency recommended by your clinician. Smooth, lump free pureed prunes and apricots that pass IDDSI puree testing expectations. All non-pureed fruits; seeds and skins. Stringy, high-pulp fruits such as papaya, pineapple, or mango that do not puree into smooth, lump free product. Uncooked dried fruits such as raisins, prunes, or apricots. Fruit leather, fruit roll-ups, fruit snacks, dried fruits.  Beverages Coffee, tea, water, and nutritional supplements in the liquid consistency recommended by your clinician. Liquids not approved by your clinician.  Other Pureed foods, including all soups with tender meats, casseroles, baked goods, and snacks made from recommended ingredients that are smooth and lump free. All seasonings and sweeteners; honey if mixed into food that passes IDDSI testing expectations. Jelly   Level 4 Pureed (Green) Sample 1-Day Menu  Breakfast 1 scrambled egg, pureed   cup farina, prepared (served without excess liquid)  1 muffin, pureed  1 teaspoon butter   cup orange juice (in liquid consistency recommended by your clinician)  1 cup coffee (in liquid consistency recommended by your clinician)  1 cup 1% milk (in liquid consistency recommended by your clinician)  Lunch 1 cup moist pureed beef stew, excess liquid drained   cup pureed cottage cheese, excess liquid drained  1 slice pureed bread  1 teaspoon butter   cup pureed fruit cocktail, excess liquid drained   cup cinnamon custard, pureed as needed to meet IDDSI testing  1 cup 1% milk (in the liquid consistency recommended by your clinician)  Evening Meal 3 ounces moist, tender pureed chicken served with:  2 tablespoons of gravy, that does not separate  from chicken (in the liquid consistency recommended by your clinician)   cup mashed potatoes   cup cooked pureed carrots  1 slice pureed bread  1 teaspoon butter   cup canned puree peaches, excess liquid drained  1 cup 1% milk (in the liquid consistency recommended by your clinician)  Evening Snack  cup vanilla pudding, smooth and passes IDDSI testing   Copyright 2022  Academy of Nutrition [employer] Dietetics. All rights  reserved.   PT recommends Dakin's solution 1/4 strength for 3 days, and then switch to Medihoney or Santyl until clean for her wound

## 2023-03-05 NOTE — Transfer of Care (Signed)
 Immediate Anesthesia Transfer of Care Note  Patient: Sharon Ramos  Procedure(s) Performed: OPEN REDUCTION INTERNAL FIXATION (ORIF) DISTAL FEMUR FRACTURE (Right)  Patient Location: PACU  Anesthesia Type:General  Level of Consciousness: drowsy and patient cooperative  Airway & Oxygen Therapy: Patient Spontanous Breathing and Patient connected to nasal cannula oxygen  Post-op Assessment: Report given to RN and Post -op Vital signs reviewed and stable  Post vital signs: Reviewed and stable  Last Vitals:  Vitals Value Taken Time  BP 131/64 03/05/23 1431  Temp    Pulse 66 03/05/23 1434  Resp 7 03/05/23 1434  SpO2 95 % 03/05/23 1434  Vitals shown include unfiled device data.  Last Pain:  Vitals:   03/05/23 1048  TempSrc:   PainSc: 5          Complications: No notable events documented.

## 2023-03-05 NOTE — Anesthesia Procedure Notes (Signed)
 Procedure Name: Intubation Date/Time: 03/05/2023 12:58 PM  Performed by: Gus Puma, CRNAPre-anesthesia Checklist: Patient identified, Emergency Drugs available, Suction available and Patient being monitored Patient Re-evaluated:Patient Re-evaluated prior to induction Oxygen Delivery Method: Circle System Utilized Preoxygenation: Pre-oxygenation with 100% oxygen Induction Type: IV induction Ventilation: Mask ventilation without difficulty Laryngoscope Size: Mac and 3 Grade View: Grade II Tube type: Oral Tube size: 7.0 mm Number of attempts: 1 Airway Equipment and Method: Stylet Placement Confirmation: ETT inserted through vocal cords under direct vision, positive ETCO2 and breath sounds checked- equal and bilateral Secured at: 22 cm Tube secured with: Tape Dental Injury: Teeth and Oropharynx as per pre-operative assessment

## 2023-03-05 NOTE — Progress Notes (Signed)
 Transition of Care [facility]) - CAGE-AID Screening   Patient Details  Name: Sharon Ramos MRN: [id number] Date of Birth: Dec 04, 1940  [facility] CM/SW Contact:    Katha Hamming, RN Phone Number: 03/05/2023, 8:43 PM   Clinical Narrative:  Denies drug and alcohol use  CAGE-AID Screening:    Have You Ever Felt You Ought to Cut Down on Your Drinking or Drug Use?: No Have People Annoyed You By Critizing Your Drinking Or Drug Use?: No Have You Felt Bad Or Guilty About Your Drinking Or Drug Use?: No Have You Ever Had a Drink or Used Drugs First Thing In The Morning to Steady Your Nerves or to Get Rid of a Hangover?: No CAGE-AID Score: 0  Substance Abuse Education Offered: No

## 2023-03-05 NOTE — Plan of Care (Signed)

## 2023-03-05 NOTE — Progress Notes (Signed)
 Pre-procedure care and checklist is completed with pt's daughter doing a sign language. Pt is A&O x2. 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 Hospitals sign language interpreter came and assisted.

## 2023-03-05 NOTE — Progress Notes (Signed)
 [facility] PROGRESS NOTE    Progress Note  Sharon Ramos  ZOX:[id number] DOB: Feb 20, 1940 DOA: 03/04/2023 PCP: Lula Olszewski, MD     Brief Narrative:   Sharon Ramos is an 83 y.o. 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  CT of the knee showed comminuted periprosthetic distal femur fracture, orthopedic surgery was consulted who recommended surgical intervention.   Assessment/Plan:   Closed fracture of right distal femur (HCC) Secondary to mechanical falls. Orthopedic surgery was consulted recommended narcotics, was started on a bowel regimen. And surgical intervention today. Nonweightbearing. Analgesics and DVT prophylaxis per orthopedic surgery. Patient is moderate risk for cardiopulmonary complications.  Shortness of breath/confusion: SARS-CoV-2 influenza and RSV PCR negative. Continue inhalers.  Essential hypertension: Continue bloating.  Chronic kidney disease stage IV: Her creatinine appears to be at baseline.  Anxiety/delusion/auditory elucidation/Alzheimer's dementia: Continue Lexapro and Namenda. Add melatonin and Seroquel at night avoid benzodiazepines.  OAB: Continue oxybutynin.  Mutism/staff: Noted  Sacral decubitus ulcer present on admission stage II: RN Pressure Injury Documentation: Pressure Injury 03/04/23 Other (Comment) Medial Stage 2 -  Partial thickness loss of dermis presenting as a shallow open injury with a red, pink wound bed without slough. (Active)  03/04/23 2000  Location: Other (Comment)  Location Orientation: Medial  Staging: Stage 2 -  Partial thickness loss of dermis presenting as a shallow open injury with a red, pink wound bed without slough.  Wound Description (Comments):   Present on  Admission: Yes  Dressing Type Foam - Lift dressing to assess site every shift 03/04/23 2030     DVT prophylaxis: lovenox Family Communication:none Status is: Inpatient Remains inpatient appropriate because: Close femur fracture    Code Status:     Code Status Orders  (From admission, onward)           Start     Ordered   03/04/23 1750  Full code  Continuous       Question:  By:  Answer:  Consent: discussion documented in EHR   03/04/23 1755           Code Status History     This patient has a current code status but no historical code status.      Advance Directive Documentation    Flowsheet Row Most Recent Value  Type of Advance Directive Living will, Healthcare Power of Attorney  Pre-existing out of facility DNR order (yellow form or pink MOST form) --  "MOST" Form in Place? --         IV Access:   Peripheral IV   Procedures and diagnostic studies:   CT KNEE RIGHT WO CONTRAST Result Date: 03/04/2023 CLINICAL DATA:  Larey Seat, right knee fracture EXAM: CT OF THE RIGHT KNEE WITHOUT CONTRAST TECHNIQUE: Multidetector CT imaging of the right knee was performed according to the standard protocol. Multiplanar CT image reconstructions were also generated. RADIATION DOSE REDUCTION: This exam was performed according to the departmental dose-optimization program which includes automated exposure control, adjustment of the mA and/or kV according to patient size and/or use of iterative reconstruction technique. COMPARISON:  03/04/2023 FINDINGS: Bones/Joint/Cartilage There is an oblique comminuted periprosthetic distal right femur fracture. The distal fracture fragment including the femoral component of the right knee prosthesis is displaced 4 cm distally and 6.5 cm laterally. In addition, there is 4  cm of proximal migration of the distal fracture fragment relative to the femoral diaphysis. There is slight lateral tilt of the patellar component, without frank dislocation.  Proximal tibia and fibula are unremarkable. Ligaments Suboptimally assessed by CT. Muscles and Tendons Diffuse muscular atrophy. Soft tissues Soft tissue swelling surrounding the distal femoral fracture site within the deep compartment of the distal right thigh. The superficial soft tissues are unremarkable. There is no fluid collection or hematoma. Reconstructed images demonstrate no additional findings. IMPRESSION: 1. Comminuted periprosthetic distal right femur fracture, with dorsal, lateral, and proximal migration of the distal fracture fragment as described above. 2. Marked soft tissue swelling surrounding the femoral fracture site. No fluid collection or hematoma. Electronically Signed   By: Sharlet Salina M.D.   On: 03/04/2023 19:40   CT Head Wo Contrast Result Date: 03/04/2023 CLINICAL DATA:  Multiple falls. EXAM: CT HEAD WITHOUT CONTRAST TECHNIQUE: Contiguous axial images were obtained from the base of the skull through the vertex without intravenous contrast. RADIATION DOSE REDUCTION: This exam was performed according to the departmental dose-optimization program which includes automated exposure control, adjustment of the mA and/or kV according to patient size and/or use of iterative reconstruction technique. COMPARISON:  December 15, 2020 FINDINGS: Brain: There is mild cerebral atrophy with widening of the extra-axial spaces and ventricular dilatation. There are areas of decreased attenuation within the white matter tracts of the supratentorial brain, consistent with microvascular disease changes. Vascular: No hyperdense vessel or unexpected calcification. Skull: Normal. Negative for fracture or focal lesion. Sinuses/Orbits: There is mild bilateral maxillary sinus and mild bilateral ethmoid sinus mucosal thickening. Other: None. IMPRESSION: 1. No acute intracranial abnormality. 2. Generalized cerebral atrophy with widening of the extra-axial spaces and ventricular dilatation. 3. Mild bilateral  maxillary sinus and mild bilateral ethmoid sinus disease. Electronically Signed   By: Aram Candela M.D.   On: 03/04/2023 17:18   CT Cervical Spine Wo Contrast Result Date: 03/04/2023 CLINICAL DATA:  Neck trauma. Fall at home last night and this morning. EXAM: CT CERVICAL SPINE WITHOUT CONTRAST TECHNIQUE: Multidetector CT imaging of the cervical spine was performed without intravenous contrast. Multiplanar CT image reconstructions were also generated. RADIATION DOSE REDUCTION: This exam was performed according to the departmental dose-optimization program which includes automated exposure control, adjustment of the mA and/or kV according to patient size and/or use of iterative reconstruction technique. COMPARISON:  CT cervical spine 07/24/2020. FINDINGS: Alignment: Stable normal alignment. Skull base and vertebrae: No evidence of acute fracture or traumatic subluxation. Multilevel spondylosis with probable interbody ankylosis at C6-7. Soft tissues and spinal canal: No prevertebral fluid or swelling. No visible canal hematoma. Disc levels: Multilevel spondylosis with disc space narrowing, endplate osteophytes and facet hypertrophy. There is the potential for spinal stenosis at several levels, greatest at C6-7, grossly stable. Mild multilevel osseous foraminal narrowing. Upper chest: Clear lung apices. Bilateral carotid atherosclerosis. Grossly stable 1.3 cm left thyroid nodule on image 76/6 for which no specific follow-up imaging is recommended. Other: None. IMPRESSION: 1. No evidence of acute cervical spine fracture, traumatic subluxation or static signs of instability. 2. Multilevel cervical spondylosis, grossly stable. Electronically Signed   By: Carey Bullocks M.D.   On: 03/04/2023 17:17   DG FEMUR, MIN 2 VIEWS RIGHT Result Date: 03/04/2023 CLINICAL DATA:  Fall with knee pain. EXAM: RIGHT FEMUR 2 VIEWS COMPARISON:  Knee radiographs dated 07/24/2020. FINDINGS: The patient is status post a knee  arthroplasty. There is a displaced (1 shaft with) and overriding (approximately 8 cm) fracture of  the distal femur. IMPRESSION: Displaced and overriding fracture of the distal femur. Electronically Signed   By: Romona Curls M.D.   On: 03/04/2023 16:17   DG Chest 2 View Result Date: 03/04/2023 CLINICAL DATA:  Cough, coarse breath sounds. EXAM: CHEST - 2 VIEW COMPARISON:  Chest radiograph dated 10/08/2013 FINDINGS: The heart is enlarged. Minimal bibasilar atelectasis/airspace disease. No significant pleural effusion or pneumothorax. Degenerative changes are seen in the spine. IMPRESSION: Minimal bibasilar atelectasis/airspace disease. Electronically Signed   By: Romona Curls M.D.   On: 03/04/2023 16:13   DG Pelvis 1-2 Views Result Date: 03/04/2023 CLINICAL DATA:  Fall with pelvic pain. EXAM: PELVIS - 1-2 VIEW COMPARISON:  Pelvic radiographs dated 10/07/2013. FINDINGS: There is no evidence of pelvic fracture or diastasis. 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 Severe degenerative changes are seen in the lumbar spine. Stool overlies the rectum. IMPRESSION: 1. No acute osseous injury. Electronically Signed   By: Romona Curls M.D.   On: 03/04/2023 16:11   DG Knee 1-2 Views Right Result Date: 03/04/2023 CLINICAL DATA:  Fall with knee pain. EXAM: RIGHT KNEE - 1-2 VIEW COMPARISON:  Knee radiographs dated 07/24/2020 FINDINGS: The patient is status post a knee arthroplasty. There is a displaced (1 shaft width) and overriding (approximately 8 cm) fracture of the distal femur. No knee joint dislocation, however there likely is a knee joint effusion. IMPRESSION: Displaced and overriding fracture of the distal femur. Electronically Signed   By: Romona Curls M.D.   On: 03/04/2023 16:10     Medical Consultants:   None.   Subjective:    Sharon Ramos no complains  Objective:    Vitals:   03/04/23 1943 03/04/23 2218 03/04/23 2245 03/05/23 0425  BP: 128/72 114/60  (!) 115/59  Pulse: 76 68  66   Resp: 17 16    Temp: 98.6 F (37 C) 98.2 F (36.8 C)  99.1 F (37.3 C)  TempSrc:    Oral  SpO2: (!) 89% (!) 88% 99% 93%  Weight:      Height:       SpO2: 93 %   Intake/Output Summary (Last 24 hours) at 03/05/2023 0724 Last data filed at 03/04/2023 2030 Gross per 24 hour  Intake 240 ml  Output --  Net 240 ml   Filed Weights   03/04/23 1435  Weight: 106.6 kg    Exam: General exam: In no acute distress. Respiratory system: Good air movement and clear to auscultation. Cardiovascular system: S1 & S2 heard, RRR. No JVD. Gastrointestinal system: Abdomen is nondistended, soft and nontender.  Extremities: No pedal edema. Skin: No rashes, lesions or ulcers Psychiatry: Judgement and insight appear normal. Mood & affect appropriate.    Data Reviewed:    Labs: Basic Metabolic Panel: Recent Labs  Lab 03/04/23 1607  NA 140  K 4.4  CL 105  CO2 25  GLUCOSE 115*  BUN 26*  CREATININE 2.00*  CALCIUM 8.6*   GFR Estimated Creatinine Clearance: 28.2 mL/min (A) (by C-G formula based on SCr of 2 mg/dL (H)). Liver Function Tests: Recent Labs  Lab 03/04/23 1607  AST 23  ALT 10  ALKPHOS 46  BILITOT 0.8  PROT 6.1*  ALBUMIN 3.2*   No results for input(s): "LIPASE", "AMYLASE" in the last 168 hours. No results for input(s): "AMMONIA" in the last 168 hours. Coagulation profile No results for input(s): "INR", "PROTIME" in the last 168 hours. COVID-19 Labs  No results for input(s): "DDIMER", "FERRITIN", "LDH", "CRP" in the last  72 hours.  Lab Results  Component Value Date   SARSCOV2NAA NEGATIVE 03/04/2023    CBC: Recent Labs  Lab 03/04/23 1607  WBC 9.6  NEUTROABS 7.7  HGB 10.7*  HCT 35.0*  MCV 94.3  PLT 118*   Cardiac Enzymes: No results for input(s): "CKTOTAL", "CKMB", "CKMBINDEX", "TROPONINI" in the last 168 hours. BNP (last 3 results) No results for input(s): "PROBNP" in the last 8760 hours. CBG: No results for input(s): "GLUCAP" in the last 168  hours. D-Dimer: No results for input(s): "DDIMER" in the last 72 hours. Hgb A1c: No results for input(s): "HGBA1C" in the last 72 hours. Lipid Profile: No results for input(s): "CHOL", "HDL", "LDLCALC", "TRIG", "CHOLHDL", "LDLDIRECT" in the last 72 hours. Thyroid function studies: No results for input(s): "TSH", "T4TOTAL", "T3FREE", "THYROIDAB" in the last 72 hours.  Invalid input(s): "FREET3" Anemia work up: No results for input(s): "VITAMINB12", "FOLATE", "FERRITIN", "TIBC", "IRON", "RETICCTPCT" in the last 72 hours. Sepsis Labs: Recent Labs  Lab 03/04/23 1607 03/04/23 1949  WBC 9.6  --   LATICACIDVEN 1.2 1.9   Microbiology Recent Results (from the past 240 hours)  Resp panel by RT-PCR (RSV, Flu A&B, Covid) Anterior Nasal Swab     Status: None   Collection Time: 03/04/23  4:07 PM   Specimen: Anterior Nasal Swab  Result Value Ref Range Status   SARS Coronavirus 2 by RT PCR NEGATIVE NEGATIVE Final   Influenza A by PCR NEGATIVE NEGATIVE Final   Influenza B by PCR NEGATIVE NEGATIVE Final    Comment: (NOTE) The Xpert Xpress SARS-CoV-2/FLU/RSV plus assay is intended as an aid in the diagnosis of influenza from Nasopharyngeal swab specimens and should not be used as a sole basis for treatment. Nasal washings and aspirates are unacceptable for Xpert Xpress SARS-CoV-2/FLU/RSV testing.  Fact Sheet for Patients: BloggerCourse.com  Fact Sheet for Healthcare Providers: SeriousBroker.it  This test is not yet approved or cleared by the [place] FDA and has been authorized for detection and/or diagnosis of SARS-CoV-2 by FDA under an Emergency Use Authorization (EUA). This EUA will remain in effect (meaning this test can be used) for the duration of the COVID-19 declaration under Section 564(b)(1) of the Act, 21 U.S.C. section 360bbb-3(b)(1), unless the authorization is terminated or revoked.     Resp Syncytial Virus by PCR  NEGATIVE NEGATIVE Final    Comment: (NOTE) Fact Sheet for Patients: BloggerCourse.com  Fact Sheet for Healthcare Providers: SeriousBroker.it  This test is not yet approved or cleared by the [place] FDA and has been authorized for detection and/or diagnosis of SARS-CoV-2 by FDA under an Emergency Use Authorization (EUA). This EUA will remain in effect (meaning this test can be used) for the duration of the COVID-19 declaration under Section 564(b)(1) of the Act, 21 U.S.C. section 360bbb-3(b)(1), unless the authorization is terminated or revoked.  Performed at [facility], [address], [place], [place] [address]      Medications:    amLODipine  5 mg Oral Daily   clonazePAM  0.25 mg Oral QHS   dorzolamide-timolol  1 drop Both Eyes BID   escitalopram  10 mg Oral Daily   melatonin  5 mg Oral QHS   memantine  10 mg Oral BID   oxybutynin  5 mg Oral TID   Continuous Infusions:    LOS: 1 day   Marinda Elk  [facility]  03/05/2023, 7:24 AM

## 2023-03-05 NOTE — Interval H&P Note (Signed)
 History and Physical Interval Note:  03/05/2023 12:11 PM  Sharon Ramos  has presented today for surgery, with the diagnosis of RIGHT DISTAL FEMUR FRACTURE.  The various methods of treatment have been discussed with the patient and family. After consideration of risks, benefits and other options for treatment, the patient has consented to  Procedure(s): OPEN REDUCTION INTERNAL FIXATION (ORIF) DISTAL FEMUR FRACTURE (Right) as a surgical intervention.  The patient's history has been reviewed, patient examined, no change in status, stable for surgery.  I have reviewed the patient's chart and labs.  Questions were answered to the patient's satisfaction.     Caryn Bee P Glorious Flicker

## 2023-03-06 ENCOUNTER — Encounter (HOSPITAL_COMMUNITY): Payer: Self-pay | Admitting: Student

## 2023-03-06 DIAGNOSIS — S72401A Unspecified fracture of lower end of right femur, initial encounter for closed fracture: Secondary | ICD-10-CM | POA: Diagnosis not present

## 2023-03-06 LAB — CBC
HCT: 24.8 % — ABNORMAL LOW (ref 36.0–46.0)
Hemoglobin: 8 g/dL — ABNORMAL LOW (ref 12.0–15.0)
MCH: 29.9 pg (ref 26.0–34.0)
MCHC: 32.3 g/dL (ref 30.0–36.0)
MCV: 92.5 fL (ref 80.0–100.0)
Platelets: 90 10*3/uL — ABNORMAL LOW (ref 150–400)
RBC: 2.68 MIL/uL — ABNORMAL LOW (ref 3.87–5.11)
RDW: 18.1 % — ABNORMAL HIGH (ref 11.5–15.5)
WBC: 8.6 10*3/uL (ref 4.0–10.5)
nRBC: 0.2 % (ref 0.0–0.2)

## 2023-03-06 LAB — BASIC METABOLIC PANEL
Anion gap: 7 (ref 5–15)
BUN: 43 mg/dL — ABNORMAL HIGH (ref 8–23)
CO2: 26 mmol/L (ref 22–32)
Calcium: 7.4 mg/dL — ABNORMAL LOW (ref 8.9–10.3)
Chloride: 105 mmol/L (ref 98–111)
Creatinine, Ser: 3.19 mg/dL — ABNORMAL HIGH (ref 0.44–1.00)
GFR, Estimated: 14 mL/min — ABNORMAL LOW (ref >60)
Glucose, Bld: 119 mg/dL — ABNORMAL HIGH (ref 70–99)
Potassium: 4.6 mmol/L (ref 3.5–5.1)
Sodium: 138 mmol/L (ref 135–145)

## 2023-03-06 LAB — VITAMIN D 25 HYDROXY (VIT D DEFICIENCY, FRACTURES): Vit D, 25-Hydroxy: 33.68 ng/mL (ref 30–100)

## 2023-03-06 MED ORDER — ENOXAPARIN SODIUM 30 MG/0.3ML IJ SOSY
30.0000 mg | PREFILLED_SYRINGE | INTRAMUSCULAR | Status: DC
  Administered 2023-03-07 – 2023-03-08 (×2): 30 mg via SUBCUTANEOUS
  Filled 2023-03-06 (×2): qty 0.3

## 2023-03-06 MED ORDER — SODIUM CHLORIDE 0.9 % IV SOLN: INTRAVENOUS | Status: DC

## 2023-03-06 MED ORDER — SODIUM CHLORIDE 0.9 % IV BOLUS
250.0000 mL | Freq: Once | INTRAVENOUS | Status: AC
  Administered 2023-03-06: 250 mL via INTRAVENOUS

## 2023-03-06 NOTE — Progress Notes (Signed)
 Orthopaedic Trauma Progress Note  SUBJECTIVE: Doing okay this morning.  Pain controlled at rest.  Leg was moved a lot overnight which caused some discomfort.  No chest pain. No SOB. No nausea/vomiting. No other complaints.  Has been drinking fluids but not wanting to eat much.  Daughter at bedside and is interpreting with ASL.  OBJECTIVE:  Vitals:   03/06/23 0517 03/06/23 0804  BP: (!) 104/43 (!) 102/46  Pulse: 64 66  Resp: 17   Temp: 98.4 F (36.9 C) 98.9 F (37.2 C)  SpO2: 100% 100%    General: Sitting up in bed, no acute distress Respiratory: No increased work of breathing.  Right lower extremity: Dressing clean, dry, intact.  Some tenderness throughout the distal thigh as expected.  Nontender through the calf.  Ankle dorsiflexion/plantarflexion intact.  Endorses sensation light touch of the toes.  Able to wiggle the toes.  2+ DP pulse  IMAGING: Stable post op imaging.   LABS:  Results for orders placed or performed during the hospital encounter of 03/04/23 (from the past 24 hours)  CBC     Status: Abnormal   Collection Time: 03/05/23  8:04 PM  Result Value Ref Range   WBC 10.1 4.0 - 10.5 K/uL   RBC 3.07 (L) 3.87 - 5.11 MIL/uL   Hemoglobin 9.0 (L) 12.0 - 15.0 g/dL   HCT 17.6 (L) 16.0 - 73.7 %   MCV 93.2 80.0 - 100.0 fL   MCH 29.3 26.0 - 34.0 pg   MCHC 31.5 30.0 - 36.0 g/dL   RDW 10.6 (H) 26.9 - 48.5 %   Platelets 107 (L) 150 - 400 K/uL   nRBC 0.2 0.0 - 0.2 %  Creatinine, serum     Status: Abnormal   Collection Time: 03/05/23  8:04 PM  Result Value Ref Range   Creatinine, Ser 2.99 (H) 0.44 - 1.00 mg/dL   GFR, Estimated 15 (L) >60 mL/min  Basic metabolic panel     Status: Abnormal   Collection Time: 03/06/23  8:11 AM  Result Value Ref Range   Sodium 138 135 - 145 mmol/L   Potassium 4.6 3.5 - 5.1 mmol/L   Chloride 105 98 - 111 mmol/L   CO2 26 22 - 32 mmol/L   Glucose, Bld 119 (H) 70 - 99 mg/dL   BUN 43 (H) 8 - 23 mg/dL   Creatinine, Ser 4.62 (H) 0.44 - 1.00 mg/dL    Calcium 7.4 (L) 8.9 - 10.3 mg/dL   GFR, Estimated 14 (L) >60 mL/min   Anion gap 7 5 - 15  CBC     Status: Abnormal   Collection Time: 03/06/23  8:11 AM  Result Value Ref Range   WBC 8.6 4.0 - 10.5 K/uL   RBC 2.68 (L) 3.87 - 5.11 MIL/uL   Hemoglobin 8.0 (L) 12.0 - 15.0 g/dL   HCT 70.3 (L) 50.0 - 93.8 %   MCV 92.5 80.0 - 100.0 fL   MCH 29.9 26.0 - 34.0 pg   MCHC 32.3 30.0 - 36.0 g/dL   RDW 18.2 (H) 99.3 - 71.6 %   Platelets 90 (L) 150 - 400 K/uL   nRBC 0.2 0.0 - 0.2 %    ASSESSMENT: Sharon Ramos is a 83 y.o. female, 1 Day Post-Op s/p OPEN REDUCTION INTERNAL FIXATION RIGHT DISTAL FEMUR FRACTURE  CV/Blood loss: Acute blood loss anemia, Hgb 8.0 this morning. Hemodynamically stable  PLAN: Weightbearing: WBAT RLE ROM: Okay for knee motion as tolerated Incisional and dressing care: Reinforce dressings  as needed  Showering: Okay to bring getting incisions wet starting 03/08/2023 Orthopedic device(s): None  Pain management:  1. Tylenol 650 mg q 6 hours scheduled 2. Robaxin 500 mg q 8 hours PRN 3. Oxycodone 5 mg q 4 hours PRN 4. Dilaudid 0.5 mg q 3 hours PRN VTE prophylaxis: Lovenox, SCDs ID:  Ancef 2gm post op Foley/Lines:  No foley, KVO IVFs Impediments to Fracture Healing: Vitamin D level pending, will start supplementation as indicated Dispo: PT/OT evaluation today, dispo pending.  Patient's daughter agreeable to SNF if needed.  Plan to remove dressings RLE tomorrow 03/07/2023   D/C recommendations: - Oxycodone, Robaxin for pain control - Eliquis 2.5 mg BID x 30 days for DVT prophylaxis - Possible need for Vit D supplementation  Follow - up plan: 2 weeks after discharge for wound check and repeat x-rays   Contact information:  Truitt Merle MD, Thyra Breed PA-C. After hours and holidays please check [email or web address] for group call information for [facility]   Thompson Caul, PA-C [phone] (office) [email or web address]

## 2023-03-06 NOTE — Evaluation (Addendum)
 Physical Therapy Evaluation Patient Details Name: KERIE BADGER MRN: [id number] DOB: 03-20-1940 Today's Date: 03/06/2023  History of Present Illness  Patient is a 83 year old female with  ground-level fall with a right supracondylar periprosthetic distal femur fracture. S/p ORIF.  History of hypertension, CKD 4, anxiety, hallucination, Alzheimer's dementia, anemia, deaf, mute, OAB, and obesity.  Clinical Impression  PT evaluation completed. The patient was lethargic and has difficulty communicating despite using the I-pad ASL interpreter as family not present in the room. The chart indicates patient lives with her daughter and uses a rollator for ambulation.  The patient required extensive assistance with all mobility today. Total assistance +2 person for bed mobility. Assistance required to maintain sitting balance with posterior and left lean. Unable to safely attempt standing today. The patient does not appear to be at her baseline level of functional independence. Recommend rehabilitation < 3 hours/day after this hospital stay.       If plan is discharge home, recommend the following: Two people to help with walking and/or transfers;Two people to help with bathing/dressing/bathroom;Assistance with cooking/housework;Assistance with feeding;Direct supervision/assist for medications management;Direct supervision/assist for financial management;Assist for transportation;Help with stairs or ramp for entrance;Supervision due to cognitive status   Can travel by private vehicle   No    Equipment Recommendations  (to be determined at next level of care)  Recommendations for Other Services       Functional Status Assessment Patient has had a recent decline in their functional status and demonstrates the ability to make significant improvements in function in a reasonable and predictable amount of time.     Precautions / Restrictions Precautions Precautions: Fall Recall of  Precautions/Restrictions: Impaired Restrictions Weight Bearing Restrictions Per Provider Order: Yes RLE Weight Bearing Per Provider Order: Weight bearing as tolerated      Mobility  Bed Mobility Overal bed mobility: Needs Assistance Bed Mobility: Supine to Sit, Sit to Supine     Supine to sit: Total assist, +2 for physical assistance Sit to supine: Total assist, +2 for physical assistance   General bed mobility comments: minimal to no participation provided from the patient. extensive assistance required with all mobility    Transfers                   General transfer comment: unable/unsafe to attempt at this time due to poor participation    Ambulation/Gait                  Stairs            Wheelchair Mobility     Tilt Bed    Modified Rankin (Stroke Patients Only)       Balance Overall balance assessment: Needs assistance Sitting-balance support: Feet supported Sitting balance-Leahy Scale: Zero Sitting balance - Comments: total assistance required to maintain sitting balance with heavy posterior lean                                     Pertinent Vitals/Pain Pain Assessment Pain Assessment: PAINAD Breathing: occasional labored breathing, short period of hyperventilation Negative Vocalization: none Facial Expression: facial grimacing Body Language: relaxed Consolability: distracted or reassured by voice/touch PAINAD Score: 4    Home Living Family/patient expects to be discharged to:: Private residence Living Arrangements: Children Available Help at Discharge: Family  Prior Function Prior Level of Function : Needs assist             Mobility Comments: per chart, patient uses a rollator for ambulation at home       Extremity/Trunk Assessment   Upper Extremity Assessment Upper Extremity Assessment: Generalized weakness;Defer to OT evaluation    Lower Extremity Assessment Lower  Extremity Assessment: Generalized weakness (patient is able to activate LLE movement to command once, otherwise only spontaneous movement is noted)       Communication   Communication Communication: Impaired Factors Affecting Communication: Hearing impaired;Difficulty expressing self (difficulty with signing with ASL (likely due to impaired cognition as well as weakness))    Cognition Arousal: Obtunded (periods with increased alertness with multi modal sensory stimulation) Behavior During Therapy: Flat affect   PT - Cognitive impairments: Difficult to assess Difficult to assess due to: Hard of hearing/deaf                     PT - Cognition Comments: No family in the room. Used I-pad ASL interpreter [phone] and occasionally writing along with multi modal cues Following commands: Impaired Following commands impaired: Follows one step commands inconsistently     Cueing Cueing Techniques: Verbal cues, Gestural cues, Tactile cues, Visual cues     General Comments General comments (skin integrity, edema, etc.): patient's mouth had pocketed food from a prior meal. OT suctioned food and nurse aware. Sp02 down to 80's with placing bed in Trendelenburg  briefly for repositioning. breath sounds are wet at times.    Exercises     Assessment/Plan    PT Assessment Patient needs continued PT services  PT Problem List Decreased strength;Decreased range of motion;Decreased activity tolerance;Decreased balance;Decreased mobility;Decreased safety awareness;Decreased knowledge of precautions;Cardiopulmonary status limiting activity;Pain;Obesity;Decreased cognition;Decreased knowledge of use of DME       PT Treatment Interventions DME instruction;Gait training;Functional mobility training;Therapeutic activities;Therapeutic exercise;Balance training;Neuromuscular re-education;Cognitive remediation;Patient/family education;Wheelchair mobility training    PT Goals (Current goals can be found  in the Care Plan section)  Acute Rehab PT Goals Patient Stated Goal: patient unable to participate with goal setting PT Goal Formulation: Patient unable to participate in goal setting Time For Goal Achievement: 03/20/23 Potential to Achieve Goals: Fair    Frequency Min 2X/week     Co-evaluation PT/OT/SLP Co-Evaluation/Treatment: Yes Reason for Co-Treatment: For patient/therapist safety;To address functional/ADL transfers;Necessary to address cognition/behavior during functional activity PT goals addressed during session: Mobility/safety with mobility         AM-PAC PT "6 Clicks" Mobility  Outcome Measure Help needed turning from your back to your side while in a flat bed without using bedrails?: Total Help needed moving from lying on your back to sitting on the side of a flat bed without using bedrails?: Total Help needed moving to and from a bed to a chair (including a wheelchair)?: Total Help needed standing up from a chair using your arms (e.g., wheelchair or bedside chair)?: Total Help needed to walk in hospital room?: Total Help needed climbing 3-5 steps with a railing? : Total 6 Click Score: 6    End of Session Equipment Utilized During Treatment: Oxygen Activity Tolerance: Patient limited by fatigue;Patient limited by lethargy Patient left: in bed;with call bell/phone within reach;with bed alarm set (SCD on LLE) Nurse Communication: Mobility status PT Visit Diagnosis: Muscle weakness (generalized) (M62.81);Unsteadiness on feet (R26.81)    Time: 1353-1430 PT Time Calculation (min) (ACUTE ONLY): 37 min   Charges:   PT Evaluation $PT Eval  Moderate Complexity: 1 Mod   PT General Charges $$ ACUTE PT VISIT: 1 Visit         Donna Bernard, PT, MPT   Ina Homes 03/06/2023, 3:01 PM

## 2023-03-06 NOTE — Progress Notes (Signed)
 [facility] Johann Capers NP informed via chat that no void for patient since change of shift and that bladder scan inconsistent in volume request order for I&O cath

## 2023-03-06 NOTE — Progress Notes (Signed)
 PT Cancellation Note  Patient Details Name: Sharon Ramos MRN: [id number] DOB: April 20, 1940   Cancelled Treatment:    Reason Eval/Treat Not Completed: Patient's level of consciousness;Other (comment) (Spoke with daughter at the bedside who is requesting to hold on PT until patient is more alert. Will return in the afternoon as able.)  Donna Bernard, PT, MPT  Ina Homes 03/06/2023, 11:09 AM

## 2023-03-06 NOTE — Anesthesia Postprocedure Evaluation (Signed)
 Anesthesia Post Note  Patient: Sharon Ramos  Procedure(s) Performed: OPEN REDUCTION INTERNAL FIXATION (ORIF) DISTAL FEMUR FRACTURE (Right)     Patient location during evaluation: PACU Anesthesia Type: General Level of consciousness: awake and alert Pain management: pain level controlled Vital Signs Assessment: post-procedure vital signs reviewed and stable Respiratory status: spontaneous breathing, nonlabored ventilation, respiratory function stable and patient connected to nasal cannula oxygen Cardiovascular status: blood pressure returned to baseline and stable Postop Assessment: no apparent nausea or vomiting Anesthetic complications: no   No notable events documented.  Last Vitals:  Vitals:   03/06/23 0804 03/06/23 1539  BP: (!) 102/46 (!) 119/46  Pulse: 66 71  Resp:  18  Temp: 37.2 C 36.9 C  SpO2: 100% (!) 88%    Last Pain:  Vitals:   03/06/23 0804  TempSrc: Axillary  PainSc: 0-No pain                 Kennieth Rad

## 2023-03-06 NOTE — Evaluation (Signed)
 Occupational Therapy Evaluation Patient Details Name: Sharon Ramos MRN: [id number] DOB: 1940-06-15 Today's Date: 03/06/2023   History of Present Illness   Patient is a 83 year old female with  ground-level fall with a right supracondylar periprosthetic distal femur fracture. S/p ORIF.  History of hypertension, CKD 4, anxiety, hallucination, Alzheimer's dementia, anemia, deaf, mute, OAB, and obesity.     Clinical Impressions Pt lethargic, and multisensory stimulation to maintain arousal. Ipad interpreter was used, but difficulty communicating d/t generalized weakness and cog. Unable to determine PLOF d/t cog and communication difficulties. Per chart pt was living with her daughter. Today pt is requiring max to total assist +2 for ADLs. Total assist +2 for all mobility. Recommendation <3 hours of skilled rehab daily. OT will continue to follow acutely to maximize functional independence.     If plan is discharge home, recommend the following:   Two people to help with walking and/or transfers;Two people to help with bathing/dressing/bathroom;Assistance with cooking/housework;Assistance with feeding;Direct supervision/assist for medications management;Direct supervision/assist for financial management;Assist for transportation;Supervision due to cognitive status     Functional Status Assessment   Patient has had a recent decline in their functional status and demonstrates the ability to make significant improvements in function in a reasonable and predictable amount of time.     Equipment Recommendations   Other (comment) (Defer to next venue)     Recommendations for Other Services         Precautions/Restrictions   Precautions Precautions: Fall Recall of Precautions/Restrictions: Impaired Restrictions Weight Bearing Restrictions Per Provider Order: Yes RLE Weight Bearing Per Provider Order: Weight bearing as tolerated     Mobility Bed Mobility Overal bed  mobility: Needs Assistance Bed Mobility: Supine to Sit, Sit to Supine     Supine to sit: Total assist, +2 for physical assistance Sit to supine: Total assist, +2 for physical assistance   General bed mobility comments: minimal to no participation provided from the patient. extensive assistance required with all mobility    Transfers                   General transfer comment: unable/unsafe to attempt at this time due to poor participation      Balance Overall balance assessment: Needs assistance Sitting-balance support: Feet supported Sitting balance-Leahy Scale: Zero Sitting balance - Comments: total assistance required to maintain sitting balance with heavy posterior lean                                   ADL either performed or assessed with clinical judgement   ADL Overall ADL's : Needs assistance/impaired Eating/Feeding: Maximal assistance;Bed level;Total assistance   Grooming: Maximal assistance;Bed level;Total assistance;Wash/dry face           Upper Body Dressing : Maximal assistance;Total assistance;Bed level   Lower Body Dressing: Total assistance;+2 for physical assistance;Bed level                 General ADL Comments: Limited d/t communication barriers and pt's arousal level     Vision   Vision Assessment?: No apparent visual deficits     Perception         Praxis         Pertinent Vitals/Pain Pain Assessment Pain Assessment: Faces Faces Pain Scale: Hurts even more Pain Location: R leg Pain Descriptors / Indicators: Grimacing, Guarding, Moaning Pain Intervention(s): Repositioned, Limited activity within patient's tolerance  Extremity/Trunk Assessment Upper Extremity Assessment Upper Extremity Assessment: Generalized weakness;Difficult to assess due to impaired cognition   Lower Extremity Assessment Lower Extremity Assessment: Defer to PT evaluation       Communication Communication Communication:  Impaired Factors Affecting Communication: Hearing impaired;Difficulty expressing self (Pt uses ASL, difficulty with using interpreter likely d/t cog & weakness)   Cognition Arousal: Obtunded (Periods of alertness with multi sensory stimuli) Behavior During Therapy: Flat affect Cognition: History of cognitive impairments, No family/caregiver present to determine baseline             OT - Cognition Comments: Per chart mild dementia, no family present to determine baseline, communication barriers limited                 Following commands: Impaired Following commands impaired: Follows one step commands inconsistently     Cueing  General Comments   Cueing Techniques: Verbal cues;Gestural cues;Tactile cues;Visual cues  patient's mouth had pocketed food from a prior meal. OT suctioned food and nurse aware. Sp02 down to 80's with placing bed in Trendelenburg briefly for repositioning. breath sounds are wet at times.   Exercises     Shoulder Instructions      Home Living Family/patient expects to be discharged to:: Private residence Living Arrangements: Children Available Help at Discharge: Family                             Additional Comments: Limited d/t communication deficits      Prior Functioning/Environment Prior Level of Function : Needs assist             Mobility Comments: per chart, patient uses a rollator for ambulation at home ADLs Comments: Living with daughter, unable to determine PLOF d/t communication barriers    OT Problem List: Decreased strength;Decreased range of motion;Decreased activity tolerance;Impaired balance (sitting and/or standing);Decreased cognition;Decreased knowledge of use of DME or AE;Impaired UE functional use   OT Treatment/Interventions: Self-care/ADL training;Therapeutic exercise;Energy conservation;DME and/or AE instruction;Therapeutic activities;Patient/family education;Balance training      OT Goals(Current  goals can be found in the care plan section)   Acute Rehab OT Goals OT Goal Formulation: Patient unable to participate in goal setting Time For Goal Achievement: 03/20/23 Potential to Achieve Goals: Fair ADL Goals Pt Will Perform Eating: with min assist;bed level Pt Will Perform Grooming: with min assist;bed level Pt Will Perform Upper Body Dressing: with set-up;bed level Pt Will Perform Lower Body Dressing: with min assist;bed level Additional ADL Goal #1: Pt will complete bed mobility with min assist to aid in ADLs   OT Frequency:  Min 2X/week    Co-evaluation PT/OT/SLP Co-Evaluation/Treatment: Yes Reason for Co-Treatment: Necessary to address cognition/behavior during functional activity;For patient/therapist safety;To address functional/ADL transfers PT goals addressed during session: Mobility/safety with mobility OT goals addressed during session: ADL's and self-care;Proper use of Adaptive equipment and DME      AM-PAC OT "6 Clicks" Daily Activity     Outcome Measure Help from another person eating meals?: Total Help from another person taking care of personal grooming?: Total Help from another person toileting, which includes using toliet, bedpan, or urinal?: Total Help from another person bathing (including washing, rinsing, drying)?: Total Help from another person to put on and taking off regular upper body clothing?: Total Help from another person to put on and taking off regular lower body clothing?: Total 6 Click Score: 6   End of Session Equipment Utilized During Treatment: Oxygen (2L)  Nurse Communication: Mobility status;Weight bearing status (pt's risk for aspiration and order for air replacement mattress and prevalon boots)  Activity Tolerance: Patient limited by lethargy;Patient limited by pain Patient left: in bed;with call bell/phone within reach;with bed alarm set  OT Visit Diagnosis: Muscle weakness (generalized) (M62.81);History of falling (Z91.81);Feeding  difficulties (R63.3);Other symptoms and signs involving cognitive function                Time: 7829-5621 OT Time Calculation (min): 40 min Charges:  OT General Charges $OT Visit: 1 Visit OT Evaluation $OT Eval Moderate Complexity: 1 Mod  Ivor Messier, OT  Acute Rehabilitation Services Office [phone] Secure chat preferred   [name] 03/06/2023, 3:42 PM

## 2023-03-06 NOTE — Progress Notes (Signed)
 Nursing staff requested assistance with I and O Cath. Order confirmed and I and O cath done per policy and procedure. Total output will be documented by nursing staff.

## 2023-03-06 NOTE — Progress Notes (Addendum)
 [facility] PROGRESS NOTE    Progress Note  Sharon Ramos  YCX:[id number] DOB: 03-Apr-1940 DOA: 03/04/2023 PCP: Theodis Shove, DO     Brief Narrative:   Sharon Ramos is an 83 y.o. 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  CT of the knee showed comminuted periprosthetic distal femur fracture, orthopedic surgery was consulted who recommended surgical intervention.   Assessment/Plan:   Closed fracture of right distal femur (HCC) Secondary to mechanical falls. Orthopedic surgery was consulted recommended status post surgical intervention on 03/06/2023 narcotics per [facility] surgery. Started on MiraLAX p.o. twice daily Analgesics and DVT prophylaxis per orthopedic surgery.  Orthopedic recommended Eliquis for DVT prophylaxis twice a day for 30 days. Oxycodone and Robaxin for pain control. Patient is moderate risk for cardiopulmonary complications. PT OT eval is pending. Anticipate skilled nursing facility placement.  Shortness of breath/confusion: SARS-CoV-2 influenza and RSV PCR negative. Continue inhalers.  Essential hypertension: Continue bloating.  New acute kidney injury on chronic kidney disease stage IV: Creatinine elevated today likely hemodynamic mediated. She is not on a diuretic or ACE inhibitor at home. Started on IV fluid strict I's and O's and daily weights. Check a basic metabolic panel in the morning.  Anxiety/delusion/auditory elucidation/Alzheimer's dementia: Continue Lexapro and Namenda. Add melatonin and Seroquel at night avoid benzodiazepines.  OAB: Continue oxybutynin.  Morbid obesity: Noted  Mutism/staff: Noted  Sacral decubitus ulcer present on admission stage II: RN Pressure Injury  Documentation: Pressure Injury 03/04/23 Other (Comment) Medial Stage 2 -  Partial thickness loss of dermis presenting as a shallow open injury with a red, pink wound bed without slough. (Active)  03/04/23 2000  Location: Other (Comment)  Location Orientation: Medial  Staging: Stage 2 -  Partial thickness loss of dermis presenting as a shallow open injury with a red, pink wound bed without slough.  Wound Description (Comments):   Present on Admission: Yes  Dressing Type Foam - Lift dressing to assess site every shift 03/05/23 2100     DVT prophylaxis: lovenox Family Communication:none Status is: Inpatient Remains inpatient appropriate because: Close femur fracture    Code Status:     Code Status Orders  (From admission, onward)           Start     Ordered   03/04/23 1750  Full code  Continuous       Question:  By:  Answer:  Consent: discussion documented in EHR   03/04/23 1755           Code Status History     This patient has a current code status but no historical code status.      Advance Directive Documentation    Flowsheet Row Most Recent Value  Type of Advance Directive Living will, Healthcare Power of Attorney  Pre-existing out of facility DNR order (yellow form or pink MOST form) --  "MOST" Form in Place? --         IV Access:   Peripheral IV   Procedures and diagnostic studies:   DG FEMUR PORT, MIN 2 VIEWS RIGHT Result Date: 03/05/2023 CLINICAL DATA:  Fracture, postop. EXAM: RIGHT FEMUR PORTABLE 2 VIEW COMPARISON:  Preoperative imaging FINDINGS: Moderate plate and screw fixation of displaced distal femur fracture. Improved fracture alignment from preoperative imaging. Mild residual displacement. There is been prior  knee arthroplasty. Recent postsurgical change includes air and edema in the soft tissues. IMPRESSION: ORIF distal femur fracture without immediate postoperative complication. Electronically Signed   By: Narda Rutherford M.D.   On:  03/05/2023 15:56   DG FEMUR, MIN 2 VIEWS RIGHT Result Date: 03/05/2023 CLINICAL DATA:  Elective surgery. EXAM: RIGHT FEMUR 2 VIEWS COMPARISON:  Preoperative imaging FINDINGS: Eight fluoroscopic spot views of the right femur submitted from the operating room. Plate and screw fixation of displaced distal femur fracture, improved fracture alignment from preoperative imaging. Previous knee replacement. Fluoroscopy time 1 minutes 47 seconds. Dose 14.37 mGy. IMPRESSION: Procedural fluoroscopy during right femur fracture fixation. Electronically Signed   By: Narda Rutherford M.D.   On: 03/05/2023 15:49   DG C-Arm 1-60 Min-No Report Result Date: 03/05/2023 Fluoroscopy was utilized by the requesting physician.  No radiographic interpretation.   DG C-Arm 1-60 Min-No Report Result Date: 03/05/2023 Fluoroscopy was utilized by the requesting physician.  No radiographic interpretation.   CT KNEE RIGHT WO CONTRAST Result Date: 03/04/2023 CLINICAL DATA:  Larey Seat, right knee fracture EXAM: CT OF THE RIGHT KNEE WITHOUT CONTRAST TECHNIQUE: Multidetector CT imaging of the right knee was performed according to the standard protocol. Multiplanar CT image reconstructions were also generated. RADIATION DOSE REDUCTION: This exam was performed according to the departmental dose-optimization program which includes automated exposure control, adjustment of the mA and/or kV according to patient size and/or use of iterative reconstruction technique. COMPARISON:  03/04/2023 FINDINGS: Bones/Joint/Cartilage There is an oblique comminuted periprosthetic distal right femur fracture. The distal fracture fragment including the femoral component of the right knee prosthesis is displaced 4 cm distally and 6.5 cm laterally. In addition, there is 4 cm of proximal migration of the distal fracture fragment relative to the femoral diaphysis. There is slight lateral tilt of the patellar component, without frank dislocation. Proximal tibia and fibula are  unremarkable. Ligaments Suboptimally assessed by CT. Muscles and Tendons Diffuse muscular atrophy. Soft tissues Soft tissue swelling surrounding the distal femoral fracture site within the deep compartment of the distal right thigh. The superficial soft tissues are unremarkable. There is no fluid collection or hematoma. Reconstructed images demonstrate no additional findings. IMPRESSION: 1. Comminuted periprosthetic distal right femur fracture, with dorsal, lateral, and proximal migration of the distal fracture fragment as described above. 2. Marked soft tissue swelling surrounding the femoral fracture site. No fluid collection or hematoma. Electronically Signed   By: Sharlet Salina M.D.   On: 03/04/2023 19:40   CT Head Wo Contrast Result Date: 03/04/2023 CLINICAL DATA:  Multiple falls. EXAM: CT HEAD WITHOUT CONTRAST TECHNIQUE: Contiguous axial images were obtained from the base of the skull through the vertex without intravenous contrast. RADIATION DOSE REDUCTION: This exam was performed according to the departmental dose-optimization program which includes automated exposure control, adjustment of the mA and/or kV according to patient size and/or use of iterative reconstruction technique. COMPARISON:  December 15, 2020 FINDINGS: Brain: There is mild cerebral atrophy with widening of the extra-axial spaces and ventricular dilatation. There are areas of decreased attenuation within the white matter tracts of the supratentorial brain, consistent with microvascular disease changes. Vascular: No hyperdense vessel or unexpected calcification. Skull: Normal. Negative for fracture or focal lesion. Sinuses/Orbits: There is mild bilateral maxillary sinus and mild bilateral ethmoid sinus mucosal thickening. Other: None. IMPRESSION: 1. No acute intracranial abnormality. 2. Generalized cerebral atrophy with widening of the extra-axial spaces and ventricular dilatation. 3. Mild bilateral maxillary sinus and mild bilateral  ethmoid sinus disease. Electronically Signed  By: Aram Candela M.D.   On: 03/04/2023 17:18   CT Cervical Spine Wo Contrast Result Date: 03/04/2023 CLINICAL DATA:  Neck trauma. Fall at home last night and this morning. EXAM: CT CERVICAL SPINE WITHOUT CONTRAST TECHNIQUE: Multidetector CT imaging of the cervical spine was performed without intravenous contrast. Multiplanar CT image reconstructions were also generated. RADIATION DOSE REDUCTION: This exam was performed according to the departmental dose-optimization program which includes automated exposure control, adjustment of the mA and/or kV according to patient size and/or use of iterative reconstruction technique. COMPARISON:  CT cervical spine 07/24/2020. FINDINGS: Alignment: Stable normal alignment. Skull base and vertebrae: No evidence of acute fracture or traumatic subluxation. Multilevel spondylosis with probable interbody ankylosis at C6-7. Soft tissues and spinal canal: No prevertebral fluid or swelling. No visible canal hematoma. Disc levels: Multilevel spondylosis with disc space narrowing, endplate osteophytes and facet hypertrophy. There is the potential for spinal stenosis at several levels, greatest at C6-7, grossly stable. Mild multilevel osseous foraminal narrowing. Upper chest: Clear lung apices. Bilateral carotid atherosclerosis. Grossly stable 1.3 cm left thyroid nodule on image 76/6 for which no specific follow-up imaging is recommended. Other: None. IMPRESSION: 1. No evidence of acute cervical spine fracture, traumatic subluxation or static signs of instability. 2. Multilevel cervical spondylosis, grossly stable. Electronically Signed   By: Carey Bullocks M.D.   On: 03/04/2023 17:17   DG FEMUR, MIN 2 VIEWS RIGHT Result Date: 03/04/2023 CLINICAL DATA:  Fall with knee pain. EXAM: RIGHT FEMUR 2 VIEWS COMPARISON:  Knee radiographs dated 07/24/2020. FINDINGS: The patient is status post a knee arthroplasty. There is a displaced (1 shaft  with) and overriding (approximately 8 cm) fracture of the distal femur. IMPRESSION: Displaced and overriding fracture of the distal femur. Electronically Signed   By: Romona Curls M.D.   On: 03/04/2023 16:17   DG Chest 2 View Result Date: 03/04/2023 CLINICAL DATA:  Cough, coarse breath sounds. EXAM: CHEST - 2 VIEW COMPARISON:  Chest radiograph dated 10/08/2013 FINDINGS: The heart is enlarged. Minimal bibasilar atelectasis/airspace disease. No significant pleural effusion or pneumothorax. Degenerative changes are seen in the spine. IMPRESSION: Minimal bibasilar atelectasis/airspace disease. Electronically Signed   By: Romona Curls M.D.   On: 03/04/2023 16:13   DG Pelvis 1-2 Views Result Date: 03/04/2023 CLINICAL DATA:  Fall with pelvic pain. EXAM: PELVIS - 1-2 VIEW COMPARISON:  Pelvic radiographs dated 10/07/2013. FINDINGS: There is no evidence of pelvic fracture or diastasis. 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 Severe degenerative changes are seen in the lumbar spine. Stool overlies the rectum. IMPRESSION: 1. No acute osseous injury. Electronically Signed   By: Romona Curls M.D.   On: 03/04/2023 16:11   DG Knee 1-2 Views Right Result Date: 03/04/2023 CLINICAL DATA:  Fall with knee pain. EXAM: RIGHT KNEE - 1-2 VIEW COMPARISON:  Knee radiographs dated 07/24/2020 FINDINGS: The patient is status post a knee arthroplasty. There is a displaced (1 shaft width) and overriding (approximately 8 cm) fracture of the distal femur. No knee joint dislocation, however there likely is a knee joint effusion. IMPRESSION: Displaced and overriding fracture of the distal femur. Electronically Signed   By: Romona Curls M.D.   On: 03/04/2023 16:10     Medical Consultants:   None.   Subjective:    Sharon Ramos tired day did not have a good night sleep was not had a bowel movement.  Objective:    Vitals:   03/05/23 1832 03/05/23 2017 03/06/23 0517 03/06/23 0804  BP: 127/62 Marland Kitchen)  125/53 (!) 104/43  (!) 102/46  Pulse: 72 76 64 66  Resp: 18 19 17    Temp: 98.5 F (36.9 C) (!) 97.5 F (36.4 C) 98.4 F (36.9 C) 98.9 F (37.2 C)  TempSrc: Oral Oral Oral Axillary  SpO2:  94% 100% 100%  Weight:      Height:       SpO2: 100 % O2 Flow Rate (L/min): 2 L/min   Intake/Output Summary (Last 24 hours) at 03/06/2023 1008 Last data filed at 03/06/2023 0200 Gross per 24 hour  Intake 881.45 ml  Output 400 ml  Net 481.45 ml   Filed Weights   03/04/23 1435 03/05/23 1039  Weight: 106.6 kg 112.9 kg    Exam: General exam: In no acute distress. Respiratory system: Good air movement and clear to auscultation. Cardiovascular system: S1 & S2 heard, RRR. No JVD. Gastrointestinal system: Abdomen is nondistended, soft and nontender.  Extremities: No pedal edema. Skin: No rashes, lesions or ulcers Psychiatry: Judgement and insight appear normal. Mood & affect appropriate.  Data Reviewed:    Labs: Basic Metabolic Panel: Recent Labs  Lab 03/04/23 1607 03/05/23 2004 03/06/23 0811  NA 140  --  138  K 4.4  --  4.6  CL 105  --  105  CO2 25  --  26  GLUCOSE 115*  --  119*  BUN 26*  --  43*  CREATININE 2.00* 2.99* 3.19*  CALCIUM 8.6*  --  7.4*   GFR Estimated Creatinine Clearance: 18.2 mL/min (A) (by C-G formula based on SCr of 3.19 mg/dL (H)). Liver Function Tests: Recent Labs  Lab 03/04/23 1607  AST 23  ALT 10  ALKPHOS 46  BILITOT 0.8  PROT 6.1*  ALBUMIN 3.2*   No results for input(s): "LIPASE", "AMYLASE" in the last 168 hours. No results for input(s): "AMMONIA" in the last 168 hours. Coagulation profile No results for input(s): "INR", "PROTIME" in the last 168 hours. COVID-19 Labs  No results for input(s): "DDIMER", "FERRITIN", "LDH", "CRP" in the last 72 hours.  Lab Results  Component Value Date   SARSCOV2NAA NEGATIVE 03/04/2023    CBC: Recent Labs  Lab 03/04/23 1607 03/05/23 2004 03/06/23 0811  WBC 9.6 10.1 8.6  NEUTROABS 7.7  --   --   HGB 10.7* 9.0* 8.0*   HCT 35.0* 28.6* 24.8*  MCV 94.3 93.2 92.5  PLT 118* 107* 90*   Cardiac Enzymes: No results for input(s): "CKTOTAL", "CKMB", "CKMBINDEX", "TROPONINI" in the last 168 hours. BNP (last 3 results) No results for input(s): "PROBNP" in the last 8760 hours. CBG: No results for input(s): "GLUCAP" in the last 168 hours. D-Dimer: No results for input(s): "DDIMER" in the last 72 hours. Hgb A1c: No results for input(s): "HGBA1C" in the last 72 hours. Lipid Profile: No results for input(s): "CHOL", "HDL", "LDLCALC", "TRIG", "CHOLHDL", "LDLDIRECT" in the last 72 hours. Thyroid function studies: No results for input(s): "TSH", "T4TOTAL", "T3FREE", "THYROIDAB" in the last 72 hours.  Invalid input(s): "FREET3" Anemia work up: No results for input(s): "VITAMINB12", "FOLATE", "FERRITIN", "TIBC", "IRON", "RETICCTPCT" in the last 72 hours. Sepsis Labs: Recent Labs  Lab 03/04/23 1607 03/04/23 1949 03/05/23 2004 03/06/23 0811  WBC 9.6  --  10.1 8.6  LATICACIDVEN 1.2 1.9  --   --    Microbiology Recent Results (from the past 240 hours)  Resp panel by RT-PCR (RSV, Flu A&B, Covid) Anterior Nasal Swab     Status: None   Collection Time: 03/04/23  4:07 PM  Specimen: Anterior Nasal Swab  Result Value Ref Range Status   SARS Coronavirus 2 by RT PCR NEGATIVE NEGATIVE Final   Influenza A by PCR NEGATIVE NEGATIVE Final   Influenza B by PCR NEGATIVE NEGATIVE Final    Comment: (NOTE) The Xpert Xpress SARS-CoV-2/FLU/RSV plus assay is intended as an aid in the diagnosis of influenza from Nasopharyngeal swab specimens and should not be used as a sole basis for treatment. Nasal washings and aspirates are unacceptable for Xpert Xpress SARS-CoV-2/FLU/RSV testing.  Fact Sheet for Patients: BloggerCourse.com  Fact Sheet for Healthcare Providers: SeriousBroker.it  This test is not yet approved or cleared by the [place] FDA and has been authorized  for detection and/or diagnosis of SARS-CoV-2 by FDA under an Emergency Use Authorization (EUA). This EUA will remain in effect (meaning this test can be used) for the duration of the COVID-19 declaration under Section 564(b)(1) of the Act, 21 U.S.C. section 360bbb-3(b)(1), unless the authorization is terminated or revoked.     Resp Syncytial Virus by PCR NEGATIVE NEGATIVE Final    Comment: (NOTE) Fact Sheet for Patients: BloggerCourse.com  Fact Sheet for Healthcare Providers: SeriousBroker.it  This test is not yet approved or cleared by the [place] FDA and has been authorized for detection and/or diagnosis of SARS-CoV-2 by FDA under an Emergency Use Authorization (EUA). This EUA will remain in effect (meaning this test can be used) for the duration of the COVID-19 declaration under Section 564(b)(1) of the Act, 21 U.S.C. section 360bbb-3(b)(1), unless the authorization is terminated or revoked.  Performed at [facility], [address], [place], [place] [address]   Surgical pcr screen     Status: None   Collection Time: 03/05/23  5:49 AM   Specimen: Nasal Mucosa; Nasal Swab  Result Value Ref Range Status   MRSA, PCR NEGATIVE NEGATIVE Final   Staphylococcus aureus NEGATIVE NEGATIVE Final    Comment: (NOTE) The Xpert SA Assay (FDA approved for NASAL specimens in patients 40 years of age and older), is one component of a comprehensive surveillance program. It is not intended to diagnose infection nor to guide or monitor treatment. Performed at [facility], [address] [place], [place] [address]      Medications:    acetaminophen  650 mg Oral Q6H   amLODipine  5 mg Oral Daily   clonazePAM  0.25 mg Oral QHS   docusate sodium  100 mg Oral BID   dorzolamide-timolol  1 drop Both Eyes BID   enoxaparin (LOVENOX) injection  40 mg Subcutaneous Q24H   escitalopram  10 mg Oral Daily   melatonin  5 mg Oral  QHS   memantine  10 mg Oral BID   oxybutynin  5 mg Oral TID   polyethylene glycol  17 g Oral BID   Continuous Infusions:  sodium chloride 50 mL/hr at 03/06/23 0019    ceFAZolin (ANCEF) IV 2 g (03/06/23 0449)      LOS: 2 days   Marinda Elk  [facility]  03/06/2023, 10:08 AM

## 2023-03-06 NOTE — Progress Notes (Signed)
 Had conversation about patient snuff  informed that this is a no tobacco policy.

## 2023-03-06 NOTE — Evaluation (Signed)
 Occupational Therapy Evaluation Patient Details Name: Sharon Ramos MRN: [id number] DOB: Apr 25, 1940 Today's Date: 03/06/2023   History of Present Illness   Patient is a 83 year old female with  ground-level fall with a right supracondylar periprosthetic distal femur fracture. S/p ORIF.  History of hypertension, CKD 4, anxiety, hallucination, Alzheimer's dementia, anemia, deaf, mute, OAB, and obesity.     Clinical Impressions Pt lethargic, and multisensory stimulation to maintain arousal. Ipad interpreter was used, but difficulty communicating d/t generalized weakness and cog. Unable to determine PLOF d/t cog and communication difficulties. Per chart pt was living with her daughter. Today pt is requiring max to total assist +2 for ADLs. Total assist +2 for all mobility. Recommendation <3 hours of skilled rehab daily. OT will continue to follow acutely to maximize functional independence.     If plan is discharge home, recommend the following:   Two people to help with walking and/or transfers;Two people to help with bathing/dressing/bathroom;Assistance with cooking/housework;Assistance with feeding;Direct supervision/assist for medications management;Direct supervision/assist for financial management;Assist for transportation;Supervision due to cognitive status     Functional Status Assessment   Patient has had a recent decline in their functional status and demonstrates the ability to make significant improvements in function in a reasonable and predictable amount of time.     Equipment Recommendations   Other (comment) (Defer to next venue)     Recommendations for Other Services         Precautions/Restrictions   Precautions Precautions: Fall Recall of Precautions/Restrictions: Impaired Restrictions Weight Bearing Restrictions Per Provider Order: Yes RLE Weight Bearing Per Provider Order: Weight bearing as tolerated     Mobility Bed Mobility Overal bed  mobility: Needs Assistance Bed Mobility: Supine to Sit, Sit to Supine     Supine to sit: Total assist, +2 for physical assistance Sit to supine: Total assist, +2 for physical assistance   General bed mobility comments: minimal to no participation provided from the patient. extensive assistance required with all mobility    Transfers                   General transfer comment: unable/unsafe to attempt at this time due to poor participation      Balance Overall balance assessment: Needs assistance Sitting-balance support: Feet supported Sitting balance-Leahy Scale: Zero Sitting balance - Comments: total assistance required to maintain sitting balance with heavy posterior lean                                   ADL either performed or assessed with clinical judgement   ADL Overall ADL's : Needs assistance/impaired Eating/Feeding: Maximal assistance;Bed level;Total assistance   Grooming: Maximal assistance;Bed level;Total assistance;Wash/dry face           Upper Body Dressing : Maximal assistance;Total assistance;Bed level   Lower Body Dressing: Total assistance;+2 for physical assistance;Bed level                 General ADL Comments: Limited d/t communication barriers and pt's arousal level     Vision   Vision Assessment?: No apparent visual deficits     Perception         Praxis         Pertinent Vitals/Pain Pain Assessment Pain Assessment: Faces Faces Pain Scale: Hurts even more Pain Location: R leg Pain Descriptors / Indicators: Grimacing, Guarding, Moaning Pain Intervention(s): Repositioned, Limited activity within patient's tolerance  Extremity/Trunk Assessment Upper Extremity Assessment Upper Extremity Assessment: Generalized weakness;Difficult to assess due to impaired cognition   Lower Extremity Assessment Lower Extremity Assessment: Defer to PT evaluation       Communication Communication Communication:  Impaired Factors Affecting Communication: Hearing impaired;Difficulty expressing self (Pt uses ASL, difficulty with using interpreter likely d/t cog & weakness)   Cognition Arousal: Obtunded (Periods of alertness with multi sensory stimuli) Behavior During Therapy: Flat affect Cognition: History of cognitive impairments, No family/caregiver present to determine baseline             OT - Cognition Comments: Per chart mild dementia, no family present to determine baseline, communication barriers limited                 Following commands: Impaired Following commands impaired: Follows one step commands inconsistently     Cueing  General Comments   Cueing Techniques: Verbal cues;Gestural cues;Tactile cues;Visual cues  patient's mouth had pocketed food from a prior meal. OT suctioned food and nurse aware. Sp02 down to 80's with placing bed in Trendelenburg briefly for repositioning. breath sounds are wet at times. Per communication with MD, low air loss mattress and prevalon boots ordered.   Exercises     Shoulder Instructions      Home Living Family/patient expects to be discharged to:: Private residence Living Arrangements: Children Available Help at Discharge: Family                             Additional Comments: Limited d/t communication deficits      Prior Functioning/Environment Prior Level of Function : Needs assist             Mobility Comments: per chart, patient uses a rollator for ambulation at home ADLs Comments: Living with daughter, unable to determine PLOF d/t communication barriers    OT Problem List: Decreased strength;Decreased range of motion;Decreased activity tolerance;Impaired balance (sitting and/or standing);Decreased cognition;Decreased knowledge of use of DME or AE;Impaired UE functional use   OT Treatment/Interventions: Self-care/ADL training;Therapeutic exercise;Energy conservation;DME and/or AE instruction;Therapeutic  activities;Patient/family education;Balance training      OT Goals(Current goals can be found in the care plan section)   Acute Rehab OT Goals OT Goal Formulation: Patient unable to participate in goal setting Time For Goal Achievement: 03/20/23 Potential to Achieve Goals: Fair ADL Goals Pt Will Perform Eating: with min assist;bed level Pt Will Perform Grooming: with min assist;bed level Pt Will Perform Upper Body Dressing: with set-up;bed level Pt Will Perform Lower Body Dressing: with min assist;bed level Additional ADL Goal #1: Pt will complete bed mobility with min assist to aid in ADLs   OT Frequency:  Min 2X/week    Co-evaluation PT/OT/SLP Co-Evaluation/Treatment: Yes Reason for Co-Treatment: Necessary to address cognition/behavior during functional activity;For patient/therapist safety;To address functional/ADL transfers PT goals addressed during session: Mobility/safety with mobility OT goals addressed during session: ADL's and self-care;Proper use of Adaptive equipment and DME      AM-PAC OT "6 Clicks" Daily Activity     Outcome Measure Help from another person eating meals?: Total Help from another person taking care of personal grooming?: Total Help from another person toileting, which includes using toliet, bedpan, or urinal?: Total Help from another person bathing (including washing, rinsing, drying)?: Total Help from another person to put on and taking off regular upper body clothing?: Total Help from another person to put on and taking off regular lower body clothing?: Total 6 Click Score:  6   End of Session Equipment Utilized During Treatment: Oxygen (2L) Nurse Communication: Mobility status;Weight bearing status (pt's risk for aspiration and order for air replacement mattress and prevalon boots)  Activity Tolerance: Patient limited by lethargy;Patient limited by pain Patient left: in bed;with call bell/phone within reach;with bed alarm set  OT Visit  Diagnosis: Muscle weakness (generalized) (M62.81);History of falling (Z91.81);Feeding difficulties (R63.3);Other symptoms and signs involving cognitive function                Time: 1610-9604 OT Time Calculation (min): 40 min Charges:  OT General Charges $OT Visit: 1 Visit OT Evaluation $OT Eval Moderate Complexity: 1 Mod  Ivor Messier, OT  Acute Rehabilitation Services Office [phone] Secure chat preferred   [name] 03/06/2023, 3:49 PM

## 2023-03-06 NOTE — Progress Notes (Signed)
 OT Cancellation Note  Patient Details Name: Sharon Ramos MRN: [id number] DOB: 02/09/1940   Cancelled Treatment:    Reason Eval/Treat Not Completed: Fatigue/lethargy limiting ability to participate;Patient's level of consciousness;Other (comment) (Spoke with daughter at the bedside who is requesting to hold on OT until patient is more alert. Will return in the afternoon as able.)  Ivor Messier, OT  Acute Rehabilitation Services Office [phone] Secure chat preferred   [name] 03/06/2023, 11:12 AM

## 2023-03-07 ENCOUNTER — Inpatient Hospital Stay (HOSPITAL_COMMUNITY)

## 2023-03-07 DIAGNOSIS — G301 Alzheimer's disease with late onset: Secondary | ICD-10-CM | POA: Diagnosis not present

## 2023-03-07 DIAGNOSIS — S72401A Unspecified fracture of lower end of right femur, initial encounter for closed fracture: Secondary | ICD-10-CM | POA: Diagnosis not present

## 2023-03-07 DIAGNOSIS — N184 Chronic kidney disease, stage 4 (severe): Secondary | ICD-10-CM | POA: Diagnosis not present

## 2023-03-07 LAB — BASIC METABOLIC PANEL
Anion gap: 11 (ref 5–15)
BUN: 47 mg/dL — ABNORMAL HIGH (ref 8–23)
CO2: 22 mmol/L (ref 22–32)
Calcium: 7.1 mg/dL — ABNORMAL LOW (ref 8.9–10.3)
Chloride: 107 mmol/L (ref 98–111)
Creatinine, Ser: 2.9 mg/dL — ABNORMAL HIGH (ref 0.44–1.00)
GFR, Estimated: 16 mL/min — ABNORMAL LOW (ref >60)
Glucose, Bld: 112 mg/dL — ABNORMAL HIGH (ref 70–99)
Potassium: 4.2 mmol/L (ref 3.5–5.1)
Sodium: 140 mmol/L (ref 135–145)

## 2023-03-07 LAB — CBC
HCT: 23.8 % — ABNORMAL LOW (ref 36.0–46.0)
Hemoglobin: 7.5 g/dL — ABNORMAL LOW (ref 12.0–15.0)
MCH: 28.6 pg (ref 26.0–34.0)
MCHC: 31.5 g/dL (ref 30.0–36.0)
MCV: 90.8 fL (ref 80.0–100.0)
Platelets: 104 10*3/uL — ABNORMAL LOW (ref 150–400)
RBC: 2.62 MIL/uL — ABNORMAL LOW (ref 3.87–5.11)
RDW: 18 % — ABNORMAL HIGH (ref 11.5–15.5)
WBC: 7.4 10*3/uL (ref 4.0–10.5)
nRBC: 0 % (ref 0.0–0.2)

## 2023-03-07 MED ORDER — SODIUM CHLORIDE 0.9 % IV SOLN: INTRAVENOUS | Status: AC

## 2023-03-07 NOTE — Progress Notes (Addendum)
 Orthopaedic Trauma Progress Note  SUBJECTIVE: Patient pretty out of it this morning.  Does open her eyes but not responding to much.  Daughter who is her interpreter is at bedside.  Using ASL, patient does sign to her daughter that she is okay.  Otherwise not answering many questions.  Per the daughter, not eating or drinking much.  Has had difficulty swallowing, has not wanted to take her medications even crushed in applesauce.    OBJECTIVE:  Vitals:   03/07/23 0442 03/07/23 0756  BP: (!) 107/47 (!) 113/52  Pulse: 74   Resp: 16 17  Temp: 98.2 F (36.8 C) 98.9 F (37.2 C)  SpO2: 94% 93%    General: Resting in bed, no acute distress.  Difficulty keeping her eyes open Respiratory: No increased work of breathing.  Right lower extremity: Dressing changed, incisions are clean, dry, intact.  Some facial grimacing with palpation throughout the distal thigh as expected.  No visible signs of tenderness with palpation through the calf.  Ankle dorsiflexion/plantarflexion intact.  Motor and sensory exam difficult to obtain given patient's current mental status.  2+ DP pulse  IMAGING: Stable post op imaging.   LABS:  Results for orders placed or performed during the hospital encounter of 03/04/23 (from the past 24 hours)  CBC     Status: Abnormal   Collection Time: 03/07/23  6:09 AM  Result Value Ref Range   WBC 7.4 4.0 - 10.5 K/uL   RBC 2.62 (L) 3.87 - 5.11 MIL/uL   Hemoglobin 7.5 (L) 12.0 - 15.0 g/dL   HCT 16.1 (L) 09.6 - 04.5 %   MCV 90.8 80.0 - 100.0 fL   MCH 28.6 26.0 - 34.0 pg   MCHC 31.5 30.0 - 36.0 g/dL   RDW 40.9 (H) 81.1 - 91.4 %   Platelets 104 (L) 150 - 400 K/uL   nRBC 0.0 0.0 - 0.2 %  Basic metabolic panel     Status: Abnormal   Collection Time: 03/07/23  6:09 AM  Result Value Ref Range   Sodium 140 135 - 145 mmol/L   Potassium 4.2 3.5 - 5.1 mmol/L   Chloride 107 98 - 111 mmol/L   CO2 22 22 - 32 mmol/L   Glucose, Bld 112 (H) 70 - 99 mg/dL   BUN 47 (H) 8 - 23 mg/dL    Creatinine, Ser 7.82 (H) 0.44 - 1.00 mg/dL   Calcium 7.1 (L) 8.9 - 10.3 mg/dL   GFR, Estimated 16 (L) >60 mL/min   Anion gap 11 5 - 15    ASSESSMENT: Sharon Ramos is a 83 y.o. female, 2 Days Post-Op s/p OPEN REDUCTION INTERNAL FIXATION RIGHT DISTAL FEMUR FRACTURE  CV/Blood loss: Acute blood loss anemia, Hgb 7.5 this morning. Hemodynamically stable  PLAN: Weightbearing: WBAT RLE ROM: Okay for knee motion as tolerated Incisional and dressing care: Change dressings as needed.  Okay to leave incisions open to air if no drainage Showering: Okay to bring getting incisions wet starting 03/08/2023 Orthopedic device(s): None  Pain management: Continue Tylenol.  Hold oral/IV narcotics and Robaxin today given mental status VTE prophylaxis: Lovenox, SCDs ID:  Ancef 2gm post op completed Foley/Lines:  No foley, KVO IVFs Impediments to Fracture Healing: Vitamin D level 33, will hold off on starting vitamin D supplementation as patient is having difficulty swallowing   Dispo: Continue to monitor CBC.  PT/OT evaluation as able, will require SNF.  Would recommend transition to Eliquis 2.5 mg twice daily x 30 days for DVT  prophylaxis at discharge.  I will hold off on writing any prescriptions for pain medication or muscle relaxers at this time.   Follow - up plan: 2 weeks after discharge for wound check and repeat x-rays   Contact information:  Truitt Merle MD, Thyra Breed PA-C. After hours and holidays please check [email or web address] for group call information for [facility]   Thompson Caul, PA-C [phone] (office) [email or web address]

## 2023-03-07 NOTE — Progress Notes (Signed)
 [facility] PROGRESS NOTE    Progress Note  Sharon Ramos  ZOX:[id number] DOB: 10-28-1940 DOA: 03/04/2023 PCP: Theodis Shove, DO     Brief Narrative:   Sharon Ramos is an 83 y.o. 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  CT of the knee showed comminuted periprosthetic distal femur fracture, orthopedic surgery was consulted who recommended surgical intervention.   Assessment/Plan:   Closed fracture of right distal femur (HCC) Secondary to mechanical falls. Orthopedic surgery was consulted recommended status post surgical intervention on 03/06/2023 narcotics per [facility] surgery. Continue MiraLAX to today. Analgesics and DVT prophylaxis per orthopedic surgery, Eliquis twice a day for 30 days. Oxycodone and Robaxin for pain control. IV narcotics have been discontinued. Anticipate skilled nursing facility placement.  Acute metabolic encephalopathy: Question overmedicated.  Hard to evaluate her mentation due to her deafness not able to keep her eyes open.  Interpreter at bedside but she is not able to keep her eyes open. Discontinue IV and oral narcotics. Hold Robaxin. Hold melatonin. Check CT of the head. She has remained afebrile with no leukocytosis.  Shortness of breath/confusion: SARS-CoV-2 influenza and RSV PCR negative. Continue inhalers. On 3 L of nasal cannula try to wean to room air out of bed to chair when possible.  Essential hypertension: Hold antihypertensive medication.  New acute kidney injury on chronic kidney disease stage IV: Creatinine slightly improved today likely hemodynamic mediated.  With a baseline creatinine around 2 peaked at 3. Will start on IV fluids now today 2.9 continue IV fluids for an additional 24 hours  recheck tomorrow morning. Strict I's and O's and daily weights. Insert Foley  Anxiety/delusion/auditory elucidation/Alzheimer's dementia: Continue Lexapro and Namenda. Add melatonin and Seroquel at night avoid benzodiazepines.  OAB: Continue oxybutynin.  Morbid obesity: Noted  Mutism/staff: Noted  Sacral decubitus ulcer present on admission stage II: RN Pressure Injury Documentation: Pressure Injury 03/04/23 Other (Comment) Medial Stage 2 -  Partial thickness loss of dermis presenting as a shallow open injury with a red, pink wound bed without slough. (Active)  03/04/23 2000  Location: Other (Comment)  Location Orientation: Medial  Staging: Stage 2 -  Partial thickness loss of dermis presenting as a shallow open injury with a red, pink wound bed without slough.  Wound Description (Comments):   Present on Admission: Yes  Dressing Type Foam - Lift dressing to assess site every shift 03/06/23 1930     DVT prophylaxis: lovenox Family Communication:none Status is: Inpatient Remains inpatient appropriate because: Close femur fracture    Code Status:     Code Status Orders  (From admission, onward)           Start     Ordered   03/04/23 1750  Full code  Continuous       Question:  By:  Answer:  Consent: discussion documented in EHR   03/04/23 1755           Code Status History     This patient has a current code status but no historical code status.      Advance Directive Documentation    Flowsheet Row Most Recent Value  Type of Advance Directive Living will, Healthcare Power of Attorney  Pre-existing out of facility DNR order (yellow form or pink MOST form) --  "MOST" Form in Place? --  IV Access:   Peripheral IV   Procedures and diagnostic studies:   DG FEMUR PORT, MIN 2 VIEWS RIGHT Result Date: 03/05/2023 CLINICAL DATA:  Fracture, postop. EXAM: RIGHT FEMUR PORTABLE 2 VIEW COMPARISON:  Preoperative imaging FINDINGS: Moderate plate  and screw fixation of displaced distal femur fracture. Improved fracture alignment from preoperative imaging. Mild residual displacement. 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 Recent postsurgical change includes air and edema in the soft tissues. IMPRESSION: ORIF distal femur fracture without immediate postoperative complication. Electronically Signed   By: Narda Rutherford M.D.   On: 03/05/2023 15:56   DG FEMUR, MIN 2 VIEWS RIGHT Result Date: 03/05/2023 CLINICAL DATA:  Elective surgery. EXAM: RIGHT FEMUR 2 VIEWS COMPARISON:  Preoperative imaging FINDINGS: Eight fluoroscopic spot views of the right femur submitted from the operating room. Plate and screw fixation of displaced distal femur fracture, improved fracture alignment from preoperative imaging. Previous knee replacement. Fluoroscopy time 1 minutes 47 seconds. Dose 14.37 mGy. IMPRESSION: Procedural fluoroscopy during right femur fracture fixation. Electronically Signed   By: Narda Rutherford M.D.   On: 03/05/2023 15:49   DG C-Arm 1-60 Min-No Report Result Date: 03/05/2023 Fluoroscopy was utilized by the requesting physician.  No radiographic interpretation.   DG C-Arm 1-60 Min-No Report Result Date: 03/05/2023 Fluoroscopy was utilized by the requesting physician.  No radiographic interpretation.     Medical Consultants:   None.   Subjective:    Sharon Ramos sleepy this morning able to open her eyes  Objective:    Vitals:   03/06/23 2020 03/07/23 0240 03/07/23 0442 03/07/23 0756  BP: (!) 123/48 (!) 106/50 (!) 107/47 (!) 113/52  Pulse: 81  74   Resp: 16  16 17   Temp: 98.4 F (36.9 C) 99.6 F (37.6 C) 98.2 F (36.8 C) 98.9 F (37.2 C)  TempSrc: Oral Axillary    SpO2: 98% 95% 94% 93%  Weight:      Height:       SpO2: 93 % O2 Flow Rate (L/min): 2 L/min   Intake/Output Summary (Last 24 hours) at 03/07/2023 0855 Last data filed at 03/07/2023 0401 Gross per 24 hour  Intake 1495.92 ml  Output 400 ml  Net 1095.92 ml    Filed Weights   03/04/23 1435 03/05/23 1039  Weight: 106.6 kg 112.9 kg    Exam: General exam: In no acute distress.  Poor oral hygiene Respiratory system: Good air movement and clear to auscultation. Cardiovascular system: S1 & S2 heard, RRR. No JVD. Gastrointestinal system: Abdomen is nondistended, soft and nontender.  Central nervous system: Able to open her eyes and track Extremities: No pedal edema. Skin: No rashes, lesions or ulcers  Data Reviewed:    Labs: Basic Metabolic Panel: Recent Labs  Lab 03/04/23 1607 03/05/23 2004 03/06/23 0811 03/07/23 0609  NA 140  --  138 140  K 4.4  --  4.6 4.2  CL 105  --  105 107  CO2 25  --  26 22  GLUCOSE 115*  --  119* 112*  BUN 26*  --  43* 47*  CREATININE 2.00* 2.99* 3.19* 2.90*  CALCIUM 8.6*  --  7.4* 7.1*   GFR Estimated Creatinine Clearance: 20 mL/min (A) (by C-G formula based on SCr of 2.9 mg/dL (H)). Liver Function Tests: Recent Labs  Lab 03/04/23 1607  AST 23  ALT 10  ALKPHOS 46  BILITOT 0.8  PROT 6.1*  ALBUMIN 3.2*   No results for input(s): "LIPASE", "AMYLASE" in the last 168 hours.  No results for input(s): "AMMONIA" in the last 168 hours. Coagulation profile No results for input(s): "INR", "PROTIME" in the last 168 hours. COVID-19 Labs  No results for input(s): "DDIMER", "FERRITIN", "LDH", "CRP" in the last 72 hours.  Lab Results  Component Value Date   SARSCOV2NAA NEGATIVE 03/04/2023    CBC: Recent Labs  Lab 03/04/23 1607 03/05/23 2004 03/06/23 0811 03/07/23 0609  WBC 9.6 10.1 8.6 7.4  NEUTROABS 7.7  --   --   --   HGB 10.7* 9.0* 8.0* 7.5*  HCT 35.0* 28.6* 24.8* 23.8*  MCV 94.3 93.2 92.5 90.8  PLT 118* 107* 90* 104*   Cardiac Enzymes: No results for input(s): "CKTOTAL", "CKMB", "CKMBINDEX", "TROPONINI" in the last 168 hours. BNP (last 3 results) No results for input(s): "PROBNP" in the last 8760 hours. CBG: No results for input(s): "GLUCAP" in the last 168 hours. D-Dimer: No  results for input(s): "DDIMER" in the last 72 hours. Hgb A1c: No results for input(s): "HGBA1C" in the last 72 hours. Lipid Profile: No results for input(s): "CHOL", "HDL", "LDLCALC", "TRIG", "CHOLHDL", "LDLDIRECT" in the last 72 hours. Thyroid function studies: No results for input(s): "TSH", "T4TOTAL", "T3FREE", "THYROIDAB" in the last 72 hours.  Invalid input(s): "FREET3" Anemia work up: No results for input(s): "VITAMINB12", "FOLATE", "FERRITIN", "TIBC", "IRON", "RETICCTPCT" in the last 72 hours. Sepsis Labs: Recent Labs  Lab 03/04/23 1607 03/04/23 1949 03/05/23 2004 03/06/23 0811 03/07/23 0609  WBC 9.6  --  10.1 8.6 7.4  LATICACIDVEN 1.2 1.9  --   --   --    Microbiology Recent Results (from the past 240 hours)  Resp panel by RT-PCR (RSV, Flu A&B, Covid) Anterior Nasal Swab     Status: None   Collection Time: 03/04/23  4:07 PM   Specimen: Anterior Nasal Swab  Result Value Ref Range Status   SARS Coronavirus 2 by RT PCR NEGATIVE NEGATIVE Final   Influenza A by PCR NEGATIVE NEGATIVE Final   Influenza B by PCR NEGATIVE NEGATIVE Final    Comment: (NOTE) The Xpert Xpress SARS-CoV-2/FLU/RSV plus assay is intended as an aid in the diagnosis of influenza from Nasopharyngeal swab specimens and should not be used as a sole basis for treatment. Nasal washings and aspirates are unacceptable for Xpert Xpress SARS-CoV-2/FLU/RSV testing.  Fact Sheet for Patients: BloggerCourse.com  Fact Sheet for Healthcare Providers: SeriousBroker.it  This test is not yet approved or cleared by the [place] FDA and has been authorized for detection and/or diagnosis of SARS-CoV-2 by FDA under an Emergency Use Authorization (EUA). This EUA will remain in effect (meaning this test can be used) for the duration of the COVID-19 declaration under Section 564(b)(1) of the Act, 21 U.S.C. section 360bbb-3(b)(1), unless the authorization is  terminated or revoked.     Resp Syncytial Virus by PCR NEGATIVE NEGATIVE Final    Comment: (NOTE) Fact Sheet for Patients: BloggerCourse.com  Fact Sheet for Healthcare Providers: SeriousBroker.it  This test is not yet approved or cleared by the [place] FDA and has been authorized for detection and/or diagnosis of SARS-CoV-2 by FDA under an Emergency Use Authorization (EUA). This EUA will remain in effect (meaning this test can be used) for the duration of the COVID-19 declaration under Section 564(b)(1) of the Act, 21 U.S.C. section 360bbb-3(b)(1), unless the authorization is terminated or revoked.  Performed at [facility], [address] [place], [place] [address]   Surgical pcr screen     Status: None   Collection Time:  03/05/23  5:49 AM   Specimen: Nasal Mucosa; Nasal Swab  Result Value Ref Range Status   MRSA, PCR NEGATIVE NEGATIVE Final   Staphylococcus aureus NEGATIVE NEGATIVE Final    Comment: (NOTE) The Xpert SA Assay (FDA approved for NASAL specimens in patients 50 years of age and older), is one component of a comprehensive surveillance program. It is not intended to diagnose infection nor to guide or monitor treatment. Performed at [facility], [address] [place], [place] [address]      Medications:    acetaminophen  650 mg Oral Q6H   amLODipine  5 mg Oral Daily   docusate sodium  100 mg Oral BID   dorzolamide-timolol  1 drop Both Eyes BID   enoxaparin (LOVENOX) injection  30 mg Subcutaneous Q24H   escitalopram  10 mg Oral Daily   melatonin  5 mg Oral QHS   memantine  10 mg Oral BID   oxybutynin  5 mg Oral TID   polyethylene glycol  17 g Oral BID   Continuous Infusions:  sodium chloride 75 mL/hr at 03/07/23 0634      LOS: 3 days   Marinda Elk  [facility]  03/07/2023, 8:55 AM

## 2023-03-07 NOTE — Plan of Care (Signed)
 Pt still very drowsy.  Pt alert when foley placed.  Pt allowed mouth care.  Turning pt q 2hrs.  Daughters at [employer].  Pt resting comfortably. Pt NPO at this time r/t dysphagia.  Problem: Clinical Measurements: Goal: Ability to maintain clinical measurements within normal limits will improve Outcome: Progressing Goal: Will remain free from infection Outcome: Progressing Goal: Diagnostic test results will improve Outcome: Progressing Goal: Respiratory complications will improve Outcome: Progressing Goal: Cardiovascular complication will be avoided Outcome: Progressing   Problem: Coping: Goal: Level of anxiety will decrease Outcome: Progressing   Problem: Elimination: Goal: Will not experience complications related to bowel motility Outcome: Progressing   Problem: Pain Managment: Goal: General experience of comfort will improve and/or be controlled Outcome: Progressing   Problem: Safety: Goal: Ability to remain free from injury will improve Outcome: Progressing   Problem: Skin Integrity: Goal: Risk for impaired skin integrity will decrease Outcome: Progressing

## 2023-03-07 NOTE — TOC Initial Note (Signed)
 Transition of Care [facility]) - Initial/Assessment Note    Patient Details  Name: Sharon Ramos MRN: [id number] Date of Birth: 12/06/1940  Transition of Care [facility]) CM/SW Contact:    Lorri Frederick, LCSW Phone Number: 03/07/2023, 2:58 PM  Clinical Narrative:     Pt leaving room for MRI when CSW arrived. CSW spoke with daughter Olegario Messier regarding PT recommendation for SNF.  Pt from home with Olegario Messier, no current services.  Olegario Messier is agreeable to SNF, pt was at [facility] before and first choice would be return there.  Permission given to send out referral in hub.  Referral sent out in hub for SNF, CSW reached out to [facility] to review.               Expected Discharge Plan: Skilled Nursing Facility Barriers to Discharge: Continued Medical Work up, SNF Pending bed offer   Patient Goals and CMS Choice     Choice offered to / list presented to : Adult Children (daughter Olegario Messier)      Expected Discharge Plan and Services In-house Referral: Clinical Social Work   Post Acute Care Choice: Skilled Nursing Facility Living arrangements for the past 2 months: Single Family Home                                      Prior Living Arrangements/Services Living arrangements for the past 2 months: Single Family Home Lives with:: Adult Children (daughter Olegario Messier) Patient language and need for interpreter reviewed:: No        Need for Family Participation in Patient Care: Yes (Comment) Care giver support system in place?: Yes (comment) Current home services: Other (comment) (none) Criminal Activity/Legal Involvement Pertinent to Current Situation/Hospitalization: No - Comment as needed  Activities of Daily Living   ADL Screening (condition at time of admission) Independently performs ADLs?: Yes (appropriate for developmental age) Is the patient deaf or have difficulty hearing?: Yes Does the patient have difficulty seeing, even when wearing glasses/contacts?: No Does the patient have  difficulty concentrating, remembering, or making decisions?: Yes  Permission Sought/Granted                  Emotional Assessment Appearance:: Appears stated age Attitude/Demeanor/Rapport: Unable to Assess Affect (typically observed): Unable to Assess Orientation: : Oriented to Self, Oriented to Place, Oriented to  Time, Oriented to Situation      Admission diagnosis:  Closed fracture of right distal femur (HCC) [S72.401A] Dyspnea, unspecified type [R06.00] Closed displaced fracture of distal epiphysis of right femur, initial encounter [facility]) [W09.811B] Patient Active Problem List   Diagnosis Date Noted   Closed fracture of right distal femur (HCC) 03/04/2023   Sensorineural hearing loss (SNHL) of both ears 10/11/2022   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) 04/18/2021   Delusional disorder (HCC) 05/31/2020   Generalized anxiety disorder 05/31/2020   Hearing loss 01/15/2020   Severe obesity (HCC) 08/04/2019   Anemia of chronic disease 05/05/2019   Chronic kidney disease, stage 4 (severe) (HCC) 05/05/2019   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 [place] [address] Phone: [phone] Fax: [phone]     Social Drivers of Health (SDOH) Social History: SDOH Screenings   Food Insecurity: No Food Insecurity (03/04/2023)  Housing: Low Risk  (03/04/2023)  Transportation Needs: No Transportation Needs (03/04/2023)  Utilities: Not At Risk (03/04/2023)  Social Connections: Moderately Isolated (03/04/2023)  Tobacco Use:  Low Risk  (03/05/2023)   SDOH Interventions:     Readmission Risk Interventions     No data to display

## 2023-03-07 NOTE — Care Management Important Message (Signed)
 Important Message  Patient Details  Name: TYRONE BALASH MRN: [id number] Date of Birth: [birth year]   Important Message Given:  Yes - Medicare IM     Sherilyn Banker 03/07/2023, 12:50 PM

## 2023-03-07 NOTE — Plan of Care (Signed)

## 2023-03-07 NOTE — NC FL2 (Signed)
 [place] MEDICAID FL2 LEVEL OF CARE FORM     IDENTIFICATION  Patient Name: Sharon Ramos Birthdate: 22-Apr-1940 Sex: female Admission Date (Current Location): 03/04/2023  [facility] and [place] Number:  Producer, television/film/video and Address:  [facility], 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) 04/18/2021   Delusional disorder (HCC) 05/31/2020   Generalized anxiety disorder 05/31/2020   Hearing loss 01/15/2020   Severe obesity (HCC) 08/04/2019   Anemia of chronic disease 05/05/2019   Chronic kidney disease, stage 4 (severe) (HCC) 05/05/2019   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   Overactive bladder 05/05/2019    Orientation RESPIRATION BLADDER Height & Weight     Self, Time, Situation, Place  O2 ([place] 2L) Incontinent Weight: 249 lb (112.9 kg) Height:  5\' 9"  (175.3 cm)  BEHAVIORAL SYMPTOMS/MOOD NEUROLOGICAL BOWEL NUTRITION STATUS      Incontinent Diet (heart healthy)  AMBULATORY STATUS COMMUNICATION OF NEEDS Skin   Total Care Non-Verbally (uses ASL) PU Stage and Appropriate Care   PU Stage 2 Dressing:  (sacrum: foam dressing, lift every shift to assess, change PRN or every 3 days)                   Personal Care Assistance Level of Assistance  Total care Bathing Assistance: Maximum assistance Feeding assistance: Maximum assistance Dressing Assistance: Maximum  assistance Total Care Assistance: Maximum assistance   Functional Limitations Info  Hearing, Speech   Hearing Info: Impaired (deaf) Speech Info: Impaired    SPECIAL CARE FACTORS FREQUENCY  PT (By licensed PT), OT (By licensed OT)     PT Frequency: 5x/wk OT Frequency: 5x/wk            Contractures Contractures Info: Not present    Additional Factors Info  Code Status, Allergies, Psychotropic Code Status Info: Full Allergies Info: NKA Psychotropic Info: Lexapro 10mg  daily         Current Medications (03/07/2023):  This is the current hospital active medication list Current Facility-Administered Medications  Medication Dose Route Frequency Provider Last Rate Last Admin   0.9 %  sodium chloride infusion   Intravenous Continuous Marinda Elk, MD       acetaminophen (TYLENOL) tablet 650 mg  650 mg Oral Q6H Thyra Breed A, PA-C   650 mg at 03/07/23 0459   amLODipine (NORVASC) tablet 5 mg  5 mg Oral Daily [place] Bali, PA-C   5 mg at 03/06/23 6295   docusate sodium (COLACE) capsule 100 mg  100 mg Oral BID [place] Bali, PA-C   100 mg at 03/06/23 2224   dorzolamide-timolol (COSOPT) 2-0.5 % ophthalmic solution 1 drop  1 drop Both Eyes BID [place] Bali, PA-C   1 drop at 03/07/23 1227   enoxaparin (LOVENOX) injection 30 mg  30 mg Subcutaneous Q24H Ilda Basset, [place]   30 mg at 03/07/23 1227   escitalopram (  LEXAPRO) tablet 10 mg  10 mg Oral Daily [place] Bali, PA-C   10 mg at 03/06/23 9629   guaiFENesin (ROBITUSSIN) 100 MG/5ML liquid 5 mL  5 mL Oral Q4H PRN [place] Bali, PA-C       hydrALAZINE (APRESOLINE) tablet 10 mg  10 mg Oral Q6H PRN [place] Bali, PA-C       ipratropium-albuterol (DUONEB) 0.5-2.5 (3) MG/3ML nebulizer solution 3 mL  3 mL Nebulization Q6H PRN [place] Bali, PA-C   3 mL at 03/06/23 1607   memantine (NAMENDA) tablet 10 mg  10 mg Oral BID [place] Bali, PA-C   10 mg at 03/06/23 2224   methocarbamol (ROBAXIN) injection 500 mg   500 mg Intravenous Q8H PRN [place] B[place], PA-C       metoCLOPramide (REGLAN) tablet 5-10 mg  5-10 mg Oral Q8H PRN Sharon Seller, Sarah A, PA-C       Or   metoCLOPramide (REGLAN) injection 5-10 mg  5-10 mg Intravenous Q8H PRN [place] Bali, PA-C       ondansetron (ZOFRAN) tablet 4 mg  4 mg Oral Q6H PRN [place] Bali, PA-C       Or   ondansetron (ZOFRAN) injection 4 mg  4 mg Intravenous Q6H PRN [place] Bali, PA-C       oxybutynin (DITROPAN) tablet 5 mg  5 mg Oral TID Thyra Breed A, PA-C   5 mg at 03/06/23 2224   polyethylene glycol (MIRALAX / GLYCOLAX) packet 17 g  17 g Oral Daily PRN [place] Bali, PA-C         Discharge Medications: Please see discharge summary for a list of discharge medications.  Relevant Imaging Results:  Relevant Lab Results:   Additional Information SS#: [id number]  Lorri Frederick, LCSW

## 2023-03-08 ENCOUNTER — Inpatient Hospital Stay (HOSPITAL_COMMUNITY)

## 2023-03-08 DIAGNOSIS — S72441A Displaced fracture of lower epiphysis (separation) of right femur, initial encounter for closed fracture: Secondary | ICD-10-CM

## 2023-03-08 DIAGNOSIS — R0600 Dyspnea, unspecified: Secondary | ICD-10-CM

## 2023-03-08 LAB — CBC
HCT: 23.2 % — ABNORMAL LOW (ref 36.0–46.0)
Hemoglobin: 7.3 g/dL — ABNORMAL LOW (ref 12.0–15.0)
MCH: 29.2 pg (ref 26.0–34.0)
MCHC: 31.5 g/dL (ref 30.0–36.0)
MCV: 92.8 fL (ref 80.0–100.0)
Platelets: 125 10*3/uL — ABNORMAL LOW (ref 150–400)
RBC: 2.5 MIL/uL — ABNORMAL LOW (ref 3.87–5.11)
RDW: 18.6 % — ABNORMAL HIGH (ref 11.5–15.5)
WBC: 6.8 10*3/uL (ref 4.0–10.5)
nRBC: 0.4 % — ABNORMAL HIGH (ref 0.0–0.2)

## 2023-03-08 LAB — BLOOD GAS, ARTERIAL
Acid-base deficit: 6.6 mmol/L — ABNORMAL HIGH (ref 0.0–2.0)
Bicarbonate: 18.3 mmol/L — ABNORMAL LOW (ref 20.0–28.0)
O2 Saturation: 94.9 %
Patient temperature: 37.5
pCO2 arterial: 35 mmHg (ref 32–48)
pH, Arterial: 7.33 — ABNORMAL LOW (ref 7.35–7.45)
pO2, Arterial: 69 mmHg — ABNORMAL LOW (ref 83–108)

## 2023-03-08 LAB — BASIC METABOLIC PANEL
Anion gap: 8 (ref 5–15)
BUN: 48 mg/dL — ABNORMAL HIGH (ref 8–23)
CO2: 18 mmol/L — ABNORMAL LOW (ref 22–32)
Calcium: 6.6 mg/dL — ABNORMAL LOW (ref 8.9–10.3)
Chloride: 113 mmol/L — ABNORMAL HIGH (ref 98–111)
Creatinine, Ser: 2.47 mg/dL — ABNORMAL HIGH (ref 0.44–1.00)
GFR, Estimated: 19 mL/min — ABNORMAL LOW (ref >60)
Glucose, Bld: 93 mg/dL (ref 70–99)
Potassium: 4.2 mmol/L (ref 3.5–5.1)
Sodium: 139 mmol/L (ref 135–145)

## 2023-03-08 LAB — URINALYSIS, W/ REFLEX TO CULTURE (INFECTION SUSPECTED)
Bilirubin Urine: NEGATIVE
Glucose, UA: NEGATIVE mg/dL
Ketones, ur: 20 mg/dL — AB
Nitrite: NEGATIVE
Protein, ur: 100 mg/dL — AB
RBC / HPF: >50 RBC/hpf (ref 0–5)
Specific Gravity, Urine: 1.016 (ref 1.005–1.030)
pH: 5 (ref 5.0–8.0)

## 2023-03-08 LAB — GLUCOSE, CAPILLARY: Glucose-Capillary: 81 mg/dL (ref 70–99)

## 2023-03-08 LAB — HIV ANTIBODY (ROUTINE TESTING W REFLEX): HIV Screen 4th Generation wRfx: NONREACTIVE

## 2023-03-08 LAB — LACTIC ACID, PLASMA
Lactic Acid, Venous: 1.1 mmol/L (ref 0.5–1.9)
Lactic Acid, Venous: 1 mmol/L (ref 0.5–1.9)

## 2023-03-08 MED ORDER — CHLORHEXIDINE GLUCONATE CLOTH 2 % EX PADS
6.0000 | MEDICATED_PAD | Freq: Every day | CUTANEOUS | Status: DC
  Administered 2023-03-08 – 2023-03-11 (×4): 6 via TOPICAL

## 2023-03-08 MED ORDER — SODIUM CHLORIDE 0.9 % IV SOLN
2.0000 g | Freq: Every day | INTRAVENOUS | Status: AC
  Administered 2023-03-08 – 2023-03-12 (×5): 2 g via INTRAVENOUS
  Filled 2023-03-08 (×5): qty 20

## 2023-03-08 MED ORDER — SODIUM CHLORIDE 0.9 % IV SOLN
500.0000 mg | Freq: Every day | INTRAVENOUS | Status: AC
  Administered 2023-03-08 – 2023-03-12 (×5): 500 mg via INTRAVENOUS
  Filled 2023-03-08 (×6): qty 5

## 2023-03-08 MED ORDER — ACETAMINOPHEN 650 MG RE SUPP
325.0000 mg | RECTAL | Status: DC | PRN
  Administered 2023-03-08 – 2023-03-10 (×2): 325 mg via RECTAL
  Filled 2023-03-08 (×2): qty 1

## 2023-03-08 MED ORDER — SODIUM CHLORIDE 0.9 % IV SOLN: INTRAVENOUS | Status: AC

## 2023-03-08 NOTE — TOC Progression Note (Signed)
 Transition of Care [facility]) - Progression Note    Patient Details  Name: LIAN POUNDS MRN: [id number] Date of Birth: April 17, 1940  Transition of Care [facility]) CM/SW Contact  Lorri Frederick, LCSW Phone Number: 03/08/2023, 11:01 AM  Clinical Narrative:   Bed offers provided to daughter Olegario Messier.  She will review.      Expected Discharge Plan: Skilled Nursing Facility Barriers to Discharge: Continued Medical Work up, SNF Pending bed offer  Expected Discharge Plan and Services In-house Referral: Clinical Social Work   Post Acute Care Choice: Skilled Nursing Facility Living arrangements for the past 2 months: Single Family Home                                       Social Determinants of Health (SDOH) Interventions SDOH Screenings   Food Insecurity: No Food Insecurity (03/04/2023)  Housing: Low Risk  (03/04/2023)  Transportation Needs: No Transportation Needs (03/04/2023)  Utilities: Not At Risk (03/04/2023)  Social Connections: Moderately Isolated (03/04/2023)  Tobacco Use: Low Risk  (03/05/2023)    Readmission Risk Interventions     No data to display

## 2023-03-08 NOTE — Significant Event (Signed)
 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 Per daughter since surgery she has not been as alert, requiring stimulation to interact and does not interact/communicate as long as normal. Initially repeated stimulation required to obtain patients attention; pt also with dementia. Lungs diminished/rhonchi. Skin hot to touch. Mouth extremely dry and caked in thick secretions. Extremities edematous. CXR done this morning has not resulted.  142/47 (72) HR 85 RR 25-30 O2 93% 3L [place] 101.2 rectal CBG 81  Interventions/Plan of Care:  ABG NTS PRN Mouth care  Code Sepsis  Event Summary:  MD Notified: Johnna Acosta MD Call Time: 1610 Arrival Time: 1303, delayed due to prior call End Time: 1345  Truddie Crumble, RN

## 2023-03-08 NOTE — Progress Notes (Signed)
 Respiratory and rapid called pt O2 required to increase from 2L to 3L O2 sats 91%. Pt noted to have congestion which she was unable to cough. RN attempted to suction unable to, pt would not open her mouth and allow. Head of bed is at elevated to 37 degrees. Dr. Rhona Leavens notified. Rectal temp taken 101.2, pt alert after repeated stimulation pt alert and able to cough on her own.  Dr. Rhona Leavens came to bedside approximately at 1350. Daughter is present at bedside.  1355 lab at bedside to collect all labs.

## 2023-03-08 NOTE — Progress Notes (Signed)
   03/08/23 0510  Assess: MEWS Score  Temp 99.6 F (37.6 C)  BP (!) 127/104  MAP (mmHg) 114  Pulse Rate 65  SpO2 91 %  O2 Device Nasal Cannula  O2 Flow Rate (L/min) 1.5 L/min  Assess: MEWS Score  MEWS Temp 0  MEWS Systolic 0  MEWS Pulse 0  MEWS RR 0  MEWS LOC 2  MEWS Score 2  MEWS Score Color Yellow  Assess: if the MEWS score is Yellow or Red  Were vital signs accurate and taken at a resting state? Yes  Does the patient meet 2 or more of the SIRS criteria? No  MEWS guidelines implemented  Yes, yellow  Treat  MEWS Interventions Considered administering scheduled or prn medications/treatments as ordered  Take Vital Signs  Increase Vital Sign Frequency  Yellow: Q2hr x1, continue Q4hrs until patient remains green for 12hrs  Escalate  MEWS: Escalate Yellow: Discuss with charge nurse and consider notifying provider and/or RRT  Notify: Charge Nurse/RN  Name of Charge Nurse/RN Notified Electrical engineer  Assess: SIRS CRITERIA  SIRS Temperature  0  SIRS Respirations  0  SIRS Pulse 0  SIRS WBC 0  SIRS Score Sum  0

## 2023-03-08 NOTE — Plan of Care (Signed)
  Problem: Clinical Measurements: Goal: Ability to maintain clinical measurements within normal limits will improve Outcome: Progressing   Problem: Pain Managment: Goal: General experience of comfort will improve and/or be controlled Outcome: Progressing   Problem: Safety: Goal: Ability to remain free from injury will improve 03/08/2023 1619 by Gracelyn Nurse, RN Outcome: Progressing 03/08/2023 1618 by Gracelyn Nurse, RN Outcome: Progressing   Problem: Skin Integrity: Goal: Risk for impaired skin integrity will decrease 03/08/2023 1619 by Gracelyn Nurse, RN Outcome: Progressing 03/08/2023 1618 by Gracelyn Nurse, RN Outcome: Progressing  Turning q2hr Problem: Activity: Goal: Ability to tolerate increased activity will improve 03/08/2023 1619 by Gracelyn Nurse, RN Outcome: Progressing 03/08/2023 1618 by Gracelyn Nurse, RN Outcome: Progressing   Problem: Clinical Measurements: Goal: Ability to maintain a body temperature in the normal range will improve Outcome: Progressing   Problem: Respiratory: Goal: Ability to maintain adequate ventilation will improve 03/08/2023 1619 by Gracelyn Nurse, RN Outcome: Progressing 03/08/2023 1618 by Gracelyn Nurse, RN Outcome: Progressing Goal: Ability to maintain a clear airway will improve Outcome: Progressing  Pt able to cough to improve airway Problem: Fluid Volume: Goal: Hemodynamic stability will improve 03/08/2023 1619 by Gracelyn Nurse, RN Outcome: Progressing 03/08/2023 1618 by Gracelyn Nurse, RN Outcome: Progressing   Problem: Clinical Measurements: Goal: Diagnostic test results will improve 03/08/2023 1619 by Gracelyn Nurse, RN Outcome: Progressing 03/08/2023 1618 by Gracelyn Nurse, RN Outcome: Progressing Goal: Signs and symptoms of infection will decrease Outcome: Progressing   Problem: Respiratory: Goal: Ability to maintain adequate ventilation will improve 03/08/2023 1619 by Gracelyn Nurse, RN Outcome: Progressing 03/08/2023  1618 by Gracelyn Nurse, RN Outcome: Progressing   Problem: Activity: Goal: Ability to tolerate increased activity will improve Outcome: Progressing   Problem: Clinical Measurements: Goal: Ability to maintain a body temperature in the normal range will improve Outcome: Progressing   Problem: Respiratory: Goal: Ability to maintain a clear airway will improve Outcome: Progressing

## 2023-03-08 NOTE — Sepsis Progress Note (Signed)
 Notified bedside nurse of need to draw lactic acid and blood cultures ASAP then hang antibiotics, to call with questions.

## 2023-03-08 NOTE — Plan of Care (Signed)
 Pt lethargic only responding to pain stimulus. Daughter at the bedside endorsing to this nurse that "pt has been resting comfortably". Repositioning and mouth care are provided.  Problem: Education: Goal: Knowledge of General Education information will improve Description: Including pain rating scale, medication(s)/side effects and non-pharmacologic comfort measures Outcome: Progressing   Problem: Health Behavior/Discharge Planning: Goal: Ability to manage health-related needs will improve Outcome: Progressing   Problem: Clinical Measurements: Goal: Ability to maintain clinical measurements within normal limits will improve Outcome: Not Progressing

## 2023-03-08 NOTE — Progress Notes (Addendum)
 PT Cancellation Note  Patient Details Name: Sharon Ramos MRN: [id number] DOB: 04/27/1940   Cancelled Treatment:    Reason Eval/Treat Not Completed: (P) Fatigue/lethargy limiting ability to participate;Medical issues which prohibited therapy (10:44 AM Pt with wet breath sounds and remains lethargic. MD messaged in AM re: possibility of SLP consult since per previous therapy notes, pt was pocketing foods/had wet breath sounds) Per chat with MD, plan for chest x-ray today. Blinds opened in her room to promote better night/day cycle awareness and pt HOB raised more to reduce risk of aspiration/improve pulmonary clearance. 1458pm: CXR still pending, pt lethargic, plan to re-attempt following date if medically appropriate. Will continue efforts as schedule permits.   10:38 IN, 10:44 OUT Dorathy Kinsman Baileigh Modisette 03/08/2023, 11:41 AM

## 2023-03-08 NOTE — Sepsis Progress Note (Signed)
 Elink monitoring for the code sepsis protocol.

## 2023-03-08 NOTE — Hospital Course (Signed)
 83 y.o. 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  CT of the knee showed comminuted periprosthetic distal femur fracture, orthopedic surgery was consulted, later did not  recommended surgical intervention.

## 2023-03-08 NOTE — Sepsis Progress Note (Signed)
 Blood cultures drawn after antibiotics hung

## 2023-03-08 NOTE — Evaluation (Signed)
 Clinical/Bedside Swallow Evaluation Patient Details  Name: Sharon Ramos MRN: [id number] Date of Birth: [birth year]  Today's Date: 03/08/2023 Time: SLP Start Time (ACUTE ONLY): 1440 SLP Stop Time (ACUTE ONLY): 1450 SLP Time Calculation (min) (ACUTE ONLY): 10 min  Past Medical History:  Past Medical History:  Diagnosis Date   Auditory hallucinations 11/17/2020   History of kidney stones    Hypertension    Past Surgical History:  Past Surgical History:  Procedure Laterality Date   KNEE SURGERY     10-15 years ago   ORIF FEMUR FRACTURE Right 03/05/2023   Procedure: OPEN REDUCTION INTERNAL FIXATION (ORIF) DISTAL FEMUR FRACTURE;  Surgeon: Roby Lofts, MD;  Location: [facility] OR;  Service: Orthopedics;  Laterality: Right;   HPI:  Sharon Ramos is an 83 y.o. 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  CT of the knee showed comminuted periprosthetic distal femur fracture, orthopedic surgery completed on 3/4.    Assessment / Plan / Recommendation  Clinical Impression  Pt demonstrates altered mental status. She is awake, but not following commands given by daughter in [place]. Daughter reports she is "shut down" doesnt appear attentive, doesnt want to interact. SLP offered ice chip trial, pt orally held ice until suction administered. Would not accept sips of water with hand over hand assist. Pt to remain NPO, SLP will f/u tomorrow to try again. May need cortrak if mentation slow to recove. SLP Visit Diagnosis: Dysphagia, oral phase (R13.11)    Aspiration Risk  Risk for inadequate nutrition/hydration    Diet Recommendation NPO;Alternative means - temporary         Other  Recommendations Oral Care Recommendations: Oral care QID Caregiver Recommendations: Have oral  suction available    Recommendations for follow up therapy are one component of a multi-disciplinary discharge planning process, led by the attending physician.  Recommendations may be updated based on patient status, additional functional criteria and insurance authorization.  Follow up Recommendations Skilled nursing-short term rehab (<3 hours/day)      Assistance Recommended at Discharge    Functional Status Assessment Patient has had a recent decline in their functional status and demonstrates the ability to make significant improvements in function in a reasonable and predictable amount of time.  Frequency and Duration            Prognosis Prognosis for improved oropharyngeal function: Fair      Swallow Study   General HPI: Sharon Ramos is an 83 y.o. 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  CT of the knee showed comminuted periprosthetic distal femur fracture, orthopedic surgery completed on 3/4. Type of Study: Bedside Swallow Evaluation Previous Swallow Assessment: none Diet Prior to this Study: NPO Temperature Spikes Noted: No History of Recent Intubation: No Behavior/Cognition: Alert;Doesn't follow directions Oral Cavity Assessment: Dry Oral Care Completed by SLP: No Oral Cavity - Dentition: Poor condition Self-Feeding Abilities: Total assist Patient Positioning: Upright in bed Baseline Vocal Quality: Not observed    Oral/Motor/Sensory Function Overall Oral Motor/Sensory Function: Other (comment) (pt not following commands)   Ice Chips Ice chips: Impaired Presentation: Spoon Oral Phase Impairments: Poor awareness of  bolus;Reduced lingual movement/coordination Oral Phase Functional Implications: Oral holding   Thin Liquid Thin Liquid: Not  tested    Nectar Thick Nectar Thick Liquid: Not tested   Honey Thick Honey Thick Liquid: Not tested   Puree     Solid     Solid: Not tested      Dyanne Iha Riley Nearing 03/08/2023,3:05 PM

## 2023-03-08 NOTE — Progress Notes (Signed)
 Progress Note   Patient: Sharon Ramos UEA:[id number] DOB: 05-Nov-1940 DOA: 03/04/2023     4 DOS: the patient was seen and examined on 03/08/2023   Brief hospital course: 83 y.o. 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  CT of the knee showed comminuted periprosthetic distal femur fracture, orthopedic surgery was consulted, later did not  recommended surgical intervention.   Assessment and Plan: Closed fracture of right distal femur (HCC) Secondary to mechanical falls. Orthopedic surgery was consulted recommended status post surgical intervention on 03/06/2023 narcotics per BTK surgery. Analgesics and DVT prophylaxis per orthopedic surgery, Eliquis twice a day for 30 days. Narcotics held secondary to increased lethargy recently Anticipate skilled nursing facility placement.   Acute metabolic encephalopathy: Likely secondary to below sepsis with PNA. Abx started per below Narcotics held Hold Robaxin. Hold melatonin. CT of the head reviewed, neg   Shortness of breath/confusion secondary to pneumonia with sepsis not present on admission: SARS-CoV-2 influenza and RSV PCR negative. Required up to 3 L of nasal cannula Pt with increased lethargy, now with fevers to 101.27F as of 3/6 and tachypnea Ordered and reviewed CXR, findings of L>R patchy basilar airspace disease  Sepsis order set initiated. Started empiric azithro and rocephin F/u blood cx   Essential hypertension: Hold antihypertensive medication.   New acute kidney injury on chronic kidney disease stage IV: Creatinine slightly improved today likely hemodynamic mediated.  With a baseline creatinine around 2 peaked at 3. Cont on basal IVF Insert Foley   Anxiety/delusion/auditory elucidation/Alzheimer's  dementia: Continue Lexapro and Namenda. Add melatonin and Seroquel at night avoid benzodiazepines.   OAB: Continue oxybutynin.   Morbid obesity: Noted   Mutism/staff: Noted   Sacral decubitus ulcer present on admission stage II: RN Pressure Injury Documentation:     Pressure Injury 03/04/23 Other (Comment) Medial Stage 2 -  Partial thickness loss of dermis presenting as a shallow open injury with a red, pink wound bed without slough. (Active)  03/04/23 2000  Location: Other (Comment)  Location Orientation: Medial  Staging: Stage 2 -  Partial thickness loss of dermis presenting as a shallow open injury with a red, pink wound bed without slough.  Wound Description (Comments):   Present on Admission: Yes  Dressing Type Foam - Lift dressing to assess site every shift 03/06/23 1930       Subjective: More tired and lethargic  Physical Exam: Vitals:   03/08/23 1310 03/08/23 1407 03/08/23 1527 03/08/23 1550  BP:  (!) 151/47 (!) 125/55   Pulse:  85 75   Resp:  (!) 22 18   Temp: (!) 101.2 F (38.4 C)  98.7 F (37.1 C)   TempSrc: Rectal     SpO2:  95% 100% 96%  Weight:      Height:       General exam: Awake, laying in bed, in nad Respiratory system: Increased respiratory effort, no wheezing, coarse breath sounds Cardiovascular system: regular rate, s1, s2 Gastrointestinal system: Soft, nondistended, positive BS Central nervous system: CN2-12 grossly intact, strength intact Extremities: Perfused, no clubbing Skin: Normal skin turgor, no notable skin lesions seen Psychiatry: Unable to assess given mentation  Data Reviewed:  Labs reviewed: Na 139, K 4.2, Cr 2.47, WBC 6.8, Hgb 7.3, Plts 125  Family Communication: Pt in room, family at bedside  Disposition: Status  is: Inpatient Remains inpatient appropriate because: severity of illness  Planned Discharge Destination: Skilled nursing facility    Author: Rickey Barbara, MD 03/08/2023 4:18 PM  For on call review  [email or web address].

## 2023-03-08 NOTE — Progress Notes (Signed)
 Per MD no bolus ordered due to potential of fluid overload

## 2023-03-09 ENCOUNTER — Inpatient Hospital Stay (HOSPITAL_COMMUNITY)

## 2023-03-09 DIAGNOSIS — S72441A Displaced fracture of lower epiphysis (separation) of right femur, initial encounter for closed fracture: Secondary | ICD-10-CM | POA: Diagnosis not present

## 2023-03-09 DIAGNOSIS — R0600 Dyspnea, unspecified: Secondary | ICD-10-CM | POA: Diagnosis not present

## 2023-03-09 LAB — BASIC METABOLIC PANEL
Anion gap: 10 (ref 5–15)
BUN: 48 mg/dL — ABNORMAL HIGH (ref 8–23)
CO2: 20 mmol/L — ABNORMAL LOW (ref 22–32)
Calcium: 7.3 mg/dL — ABNORMAL LOW (ref 8.9–10.3)
Chloride: 114 mmol/L — ABNORMAL HIGH (ref 98–111)
Creatinine, Ser: 2.32 mg/dL — ABNORMAL HIGH (ref 0.44–1.00)
GFR, Estimated: 20 mL/min — ABNORMAL LOW (ref >60)
Glucose, Bld: 103 mg/dL — ABNORMAL HIGH (ref 70–99)
Potassium: 4.3 mmol/L (ref 3.5–5.1)
Sodium: 144 mmol/L (ref 135–145)

## 2023-03-09 LAB — CBC
HCT: 21.7 % — ABNORMAL LOW (ref 36.0–46.0)
HCT: 25.7 % — ABNORMAL LOW (ref 36.0–46.0)
Hemoglobin: 6.8 g/dL — CL (ref 12.0–15.0)
Hemoglobin: 8.1 g/dL — ABNORMAL LOW (ref 12.0–15.0)
MCH: 28.8 pg (ref 26.0–34.0)
MCH: 28.5 pg (ref 26.0–34.0)
MCHC: 31.3 g/dL (ref 30.0–36.0)
MCHC: 31.5 g/dL (ref 30.0–36.0)
MCV: 91.9 fL (ref 80.0–100.0)
MCV: 90.5 fL (ref 80.0–100.0)
Platelets: 168 10*3/uL (ref 150–400)
Platelets: 202 10*3/uL (ref 150–400)
RBC: 2.36 MIL/uL — ABNORMAL LOW (ref 3.87–5.11)
RBC: 2.84 MIL/uL — ABNORMAL LOW (ref 3.87–5.11)
RDW: 18.6 % — ABNORMAL HIGH (ref 11.5–15.5)
RDW: 18.4 % — ABNORMAL HIGH (ref 11.5–15.5)
WBC: 6.8 10*3/uL (ref 4.0–10.5)
WBC: 9.2 10*3/uL (ref 4.0–10.5)
nRBC: 0.7 % — ABNORMAL HIGH (ref 0.0–0.2)
nRBC: 0.8 % — ABNORMAL HIGH (ref 0.0–0.2)

## 2023-03-09 LAB — MAGNESIUM: Magnesium: 2.3 mg/dL (ref 1.7–2.4)

## 2023-03-09 LAB — PREPARE RBC (CROSSMATCH)

## 2023-03-09 LAB — GLUCOSE, CAPILLARY: Glucose-Capillary: 107 mg/dL — ABNORMAL HIGH (ref 70–99)

## 2023-03-09 LAB — PHOSPHORUS: Phosphorus: 3.3 mg/dL (ref 2.5–4.6)

## 2023-03-09 MED ORDER — SODIUM CHLORIDE 0.9% IV SOLUTION: Freq: Once | INTRAVENOUS | Status: AC

## 2023-03-09 MED ORDER — VITAMIN C 500 MG PO TABS
500.0000 mg | ORAL_TABLET | Freq: Two times a day (BID) | ORAL | Status: DC
  Administered 2023-03-09 – 2023-03-16 (×15): 500 mg
  Filled 2023-03-09 (×15): qty 1

## 2023-03-09 MED ORDER — HYDRALAZINE HCL 10 MG PO TABS: 10.0000 mg | ORAL_TABLET | Freq: Four times a day (QID) | ORAL | Status: DC | PRN

## 2023-03-09 MED ORDER — VITAL HIGH PROTEIN PO LIQD
1000.0000 mL | ORAL | Status: DC
Start: 2023-03-09 — End: 2023-03-09

## 2023-03-09 MED ORDER — OXYBUTYNIN CHLORIDE 5 MG PO TABS
5.0000 mg | ORAL_TABLET | Freq: Three times a day (TID) | ORAL | Status: DC
  Administered 2023-03-09 – 2023-03-16 (×23): 5 mg
  Filled 2023-03-09 (×25): qty 1

## 2023-03-09 MED ORDER — THIAMINE MONONITRATE 100 MG PO TABS
100.0000 mg | ORAL_TABLET | Freq: Every day | ORAL | Status: AC
  Administered 2023-03-09 – 2023-03-15 (×7): 100 mg
  Filled 2023-03-09 (×7): qty 1

## 2023-03-09 MED ORDER — OSMOLITE 1.5 CAL PO LIQD
1000.0000 mL | ORAL | Status: DC
Start: 2023-03-09 — End: 2023-03-16
  Administered 2023-03-09 – 2023-03-15 (×5): 1000 mL
  Filled 2023-03-09 (×10): qty 1000

## 2023-03-09 MED ORDER — PROSOURCE TF20 ENFIT COMPATIBL EN LIQD
60.0000 mL | Freq: Two times a day (BID) | ENTERAL | Status: DC
  Administered 2023-03-09 – 2023-03-22 (×21): 60 mL
  Filled 2023-03-09 (×22): qty 60

## 2023-03-09 MED ORDER — ADULT MULTIVITAMIN W/MINERALS CH
1.0000 | ORAL_TABLET | Freq: Every day | ORAL | Status: DC
  Administered 2023-03-09 – 2023-03-16 (×8): 1
  Filled 2023-03-09 (×8): qty 1

## 2023-03-09 MED ORDER — ZINC SULFATE 220 (50 ZN) MG PO CAPS
220.0000 mg | ORAL_CAPSULE | Freq: Every day | ORAL | Status: DC
  Administered 2023-03-09 – 2023-03-16 (×8): 220 mg
  Filled 2023-03-09 (×8): qty 1

## 2023-03-09 MED ORDER — AMLODIPINE BESYLATE 5 MG PO TABS
5.0000 mg | ORAL_TABLET | Freq: Every day | ORAL | Status: DC
  Administered 2023-03-09 – 2023-03-16 (×8): 5 mg
  Filled 2023-03-09 (×8): qty 1

## 2023-03-09 MED ORDER — MEMANTINE HCL 10 MG PO TABS
10.0000 mg | ORAL_TABLET | Freq: Two times a day (BID) | ORAL | Status: DC
  Administered 2023-03-09 – 2023-03-16 (×15): 10 mg
  Filled 2023-03-09 (×15): qty 1

## 2023-03-09 MED ORDER — FREE WATER
150.0000 mL | Status: DC
  Administered 2023-03-09 – 2023-03-11 (×10): 150 mL

## 2023-03-09 MED ORDER — ESCITALOPRAM OXALATE 10 MG PO TABS
10.0000 mg | ORAL_TABLET | Freq: Every day | ORAL | Status: DC
  Administered 2023-03-09 – 2023-03-16 (×8): 10 mg
  Filled 2023-03-09 (×8): qty 1

## 2023-03-09 NOTE — Progress Notes (Signed)
 Progress Note   Patient: Sharon Ramos [id number]:[id number] DOB: 01/13/1940 DOA: 03/04/2023     5 DOS: the patient was seen and examined on 03/09/2023   Brief hospital course: 83 y.o. 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  CT of the knee showed comminuted periprosthetic distal femur fracture, orthopedic surgery was consulted, later did not  recommended surgical intervention.   Assessment and Plan: Closed fracture of right distal femur (HCC) Secondary to mechanical falls. Orthopedic surgery was consulted recommended status post surgical intervention on 03/06/2023 narcotics per BTK surgery. Analgesics and DVT prophylaxis per orthopedic surgery, Eliquis twice a day for 30 days. Narcotics were held secondary to increased lethargy recently, see below Anticipate skilled nursing facility placement.   Acute metabolic encephalopathy: Likely secondary to below sepsis with PNA. Abx continued per below Narcotics held Hold Robaxin. Hold melatonin. CT of the head reviewed, neg   Shortness of breath/confusion secondary to pneumonia with sepsis not present on admission: SARS-CoV-2 influenza and RSV PCR negative. Required up to 3 L of nasal cannula Pt with increased lethargy, with fevers up to 101.72F as of 3/6 and tachypnea Ordered and reviewed CXR, findings of L>R patchy basilar airspace disease with repeat CXR demonstrating worse changes Sepsis order set was initiated. Continue empiric azithro and rocephin Blood cx neg thus far   Essential hypertension: Cont on norvasc as tolerated   New acute kidney injury on chronic kidney disease stage IV: Creatinine slightly improved today likely hemodynamic mediated.  With a baseline creatinine around 2 peaked at 3. Cont on basal  IVF Foley was recently placed empirically. Will d/c foley to reduce risk of UTI   Anxiety/delusion/auditory elucidation/Alzheimer's dementia: Continue Lexapro and Namenda.   OAB: Continue oxybutynin.   Morbid obesity: Noted   Mutism/staff: Noted   Sacral decubitus ulcer present on admission stage II: RN Pressure Injury Documentation:     Pressure Injury 03/04/23 Other (Comment) Medial Stage 2 -  Partial thickness loss of dermis presenting as a shallow open injury with a red, pink wound bed without slough. (Active)  03/04/23 2000  Location: Other (Comment)  Location Orientation: Medial  Staging: Stage 2 -  Partial thickness loss of dermis presenting as a shallow open injury with a red, pink wound bed without slough.  Wound Description (Comments):   Present on Admission: Yes  Dressing Type Foam - Lift dressing to assess site every shift 03/06/23 1930    Acute blood loss anemia -Likely secondary to recent surgery -hgb <7. Family agreed to transfusion -Transufse 1 unit. Follow CBC trends     Subjective: More awake this AM. Tracking with her eyes  Physical Exam: Vitals:   03/09/23 1245 03/09/23 1338 03/09/23 1353 03/09/23 1643  BP: (!) 138/58 (!) 142/65 132/62 138/62  Pulse: 76 76 62 76  Resp: (!) 34 (!) 30 (!) 31   Temp: 98.9 F (37.2 C) 98.5 F (36.9 C) 98.8 F (37.1 C) 98 F (36.7 C)  TempSrc: Oral   Oral  SpO2: 95%   96%  Weight:      Height:       General exam: awake, in no acute distress Respiratory system: normal chest rise, coarse breathsounds Cardiovascular system: regular rhythm, s1-s2 Gastrointestinal system: Nondistended, nontender, pos BS Central nervous system: No seizures, no tremors Extremities: No cyanosis, no joint deformities  Skin: No rashes, no pallor Psychiatry: difficult to assess given mentation  Data Reviewed:  Labs reviewed: Na 144, K 4.3, Cr 2.32, WBC 6.8, Hgb 6.8  Family Communication: Pt in room, family at  bedside  Disposition: Status is: Inpatient Remains inpatient appropriate because: severity of illness  Planned Discharge Destination: Skilled nursing facility    Author: Rickey Barbara, MD 03/09/2023 4:55 PM  For on call review [email or web address].

## 2023-03-09 NOTE — Plan of Care (Signed)
  Problem: Education: Goal: Knowledge of General Education information will improve Description: Including pain rating scale, medication(s)/side effects and non-pharmacologic comfort measures Outcome: Not Progressing   Problem: Health Behavior/Discharge Planning: Goal: Ability to manage health-related needs will improve Outcome: Not Progressing   Problem: Activity: Goal: Risk for activity intolerance will decrease Outcome: Not Progressing   Problem: Nutrition: Goal: Adequate nutrition will be maintained Outcome: Progressing

## 2023-03-09 NOTE — Progress Notes (Signed)
   03/09/23 1245  Assess: MEWS Score  Temp 98.9 F (37.2 C)  BP (!) 138/58  MAP (mmHg) 80  Pulse Rate 76  ECG Heart Rate 76  Resp (!) 34  Level of Consciousness Alert  SpO2 95 %  O2 Device Nasal Cannula  Patient Activity (if Appropriate) In bed  O2 Flow Rate (L/min) 4 L/min  Assess: MEWS Score  MEWS Temp 0  MEWS Systolic 0  MEWS Pulse 0  MEWS RR 2  MEWS LOC 0  MEWS Score 2  MEWS Score Color Yellow  Assess: if the MEWS score is Yellow or Red  Were vital signs accurate and taken at a resting state? Yes  Does the patient meet 2 or more of the SIRS criteria? No  MEWS guidelines implemented  Yes, yellow  Treat  MEWS Interventions Considered administering scheduled or prn medications/treatments as ordered  Take Vital Signs  Increase Vital Sign Frequency  Yellow: Q2hr x1, continue Q4hrs until patient remains green for 12hrs  Escalate  MEWS: Escalate Yellow: Discuss with charge nurse and consider notifying provider and/or RRT  Notify: Charge Nurse/RN  Name of Charge Nurse/RN Notified Zarah, RN  Provider Notification  Provider Name/Title Rickey Barbara MD  Date Provider Notified 03/09/23  Time Provider Notified 1250  Method of Notification Page (secure chat)  Notification Reason Other (Comment) (Yellow MEWS)  Provider response At bedside (came to bedside)  Date of Provider Response 03/09/23  Time of Provider Response 1251  Assess: SIRS CRITERIA  SIRS Temperature  0  SIRS Respirations  1  SIRS Pulse 0  SIRS WBC 0  SIRS Score Sum  1

## 2023-03-09 NOTE — Progress Notes (Signed)
 PT Cancellation Note  Patient Details Name: VENEZIA SARGEANT MRN: [id number] DOB: 05/18/1940   Cancelled Treatment:    Reason Eval/Treat Not Completed: Medical issues which prohibited therapy (Patient pending blood transfusion with hemoglobin of 6.8 and possible transfer to progressive care. Discussed with nurse. PT will hold given acute medical issues.)  Donna Bernard, PT, MPT   Ina Homes 03/09/2023, 2:30 PM

## 2023-03-09 NOTE — Progress Notes (Signed)
 Speech Language Pathology Treatment: Dysphagia  Patient Details Name: NELEH MULDOON MRN: [id number] DOB: Dec 02, 1940 Today's Date: 03/09/2023 Time: 1000-1015 SLP Time Calculation (min) (ACUTE ONLY): 15 min  Assessment / Plan / Recommendation Clinical Impression  Pt demonstrates no change in function. Her eyes are open, appears to track SLP and look at daughter when she is signing, but does not follow her commands, does not sign or nod y/n. During repositioning pt groaned and vocal quality was very wet. SLP applied oral suction and pt was holding yellow mucous in her mouth. Oral care completed though pt kept mandible sealed. When water, ice is introduced (siphoned, spoon, cup edge, hand over hand assist) pt does not move lips or mouth at all to receive it. No attention or interest in water observed.   HPI HPI: Sharon Ramos is an 83 y.o. 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  CT of the knee showed comminuted periprosthetic distal femur fracture, orthopedic surgery completed on 3/4.      SLP Plan  Continue with current plan of care      Recommendations for follow up therapy are one component of a multi-disciplinary discharge planning process, led by the attending physician.  Recommendations may be updated based on patient status, additional functional criteria and insurance authorization.    Recommendations  Diet recommendations: NPO                  Oral care QID     Dysphagia, oral phase (R13.11)     Continue with current plan of care     Gianpaolo Mindel, Riley Nearing  03/09/2023, 10:23 AM

## 2023-03-09 NOTE — TOC Progression Note (Signed)
 Transition of Care [facility]) - Progression Note    Patient Details  Name: Sharon Ramos MRN: [id number] Date of Birth: 05-Apr-1940  Transition of Care [facility]) CM/SW Contact  Lorri Frederick, LCSW Phone Number: 03/09/2023, 11:11 AM  Clinical Narrative:   CSW spoke with pt daughter Olegario Messier, discussed that there are no new bed offers.  Right now, Olegario Messier is leaning towards taking pt home at DC.  She is hopeful pt will improve enough over the next few days to make this possible.  TOC will continue to follow.     Expected Discharge Plan: Skilled Nursing Facility Barriers to Discharge: Continued Medical Work up, SNF Pending bed offer  Expected Discharge Plan and Services In-house Referral: Clinical Social Work   Post Acute Care Choice: Skilled Nursing Facility Living arrangements for the past 2 months: Single Family Home                                       Social Determinants of Health (SDOH) Interventions SDOH Screenings   Food Insecurity: No Food Insecurity (03/04/2023)  Housing: Low Risk  (03/04/2023)  Transportation Needs: No Transportation Needs (03/04/2023)  Utilities: Not At Risk (03/04/2023)  Social Connections: Moderately Isolated (03/04/2023)  Tobacco Use: Low Risk  (03/05/2023)    Readmission Risk Interventions     No data to display

## 2023-03-09 NOTE — Progress Notes (Signed)
 Report given to [facility] on 6E.

## 2023-03-09 NOTE — Progress Notes (Signed)
 Called daughter Olegario Messier for blood consent, daughter wants to wait and think about transfusion before consenting, Dr. Rhona Leavens notified.

## 2023-03-09 NOTE — Procedures (Signed)
 Cortrak  Person Inserting Tube:  Emre Stock T, RD Tube Type:  Cortrak - 43 inches Tube Size:  10 Tube Location:  Left nare Secured by: Bridle Technique Used to Measure Tube Placement:  Marking at nare/corner of mouth Cortrak Secured At:  67 cm   Cortrak Tube Team Note:  Consult received to place a Cortrak feeding tube.   X-ray ordered by the Cortrak team. Await read prior to use.   If the tube becomes dislodged please keep the tube and contact the Cortrak team at [email or web address] for replacement.  If after hours and replacement cannot be delayed, place a NG tube and confirm placement with an abdominal x-ray.    Shelle Iron RD, LDN Contact via [employer].

## 2023-03-09 NOTE — Progress Notes (Signed)
 Initial Nutrition Assessment  DOCUMENTATION CODES:   Obesity unspecified  INTERVENTION:   -Once tube placement has been verified, TF via cortrak:   Initiate Osmolite 1.5 @ 20 ml/hr and increase by 10 ml every 8 hours to goal rate of 50 ml/hr.   60 ml Prosource TF BID  150 ml free water flush every 4 hours  Tube feeding regimen provides 1960 kcal (100% of needs), 115 grams of protein, and 914 ml of H2O. Total free water: 1814 ml daily   -MVI with minerals daily -500 mg vitamin C BID -220 mg zinc sulfate daily x 14 days -100 mg thiamine daily x 7 days -Monitor Mg, K, and Phos and replete as needed secondary to high refeeding risk   NUTRITION DIAGNOSIS:   Inadequate oral intake related to inability to eat, dysphagia as evidenced by NPO status.  GOAL:   Patient will meet greater than or equal to 90% of their needs  MONITOR:   Diet advancement, TF tolerance  REASON FOR ASSESSMENT:   Consult Assessment of nutrition requirement/status  ASSESSMENT:   Pt with medical history significant of hypertension, CKD 4, anxiety, hallucination, delusion, Alzheimer's dementia, anemia, deaf, mute, OAB, obesity presenting with multiple falls and knee pain.  Pt admitted with closed fracture of rt distal femur.   3/3- s/p Open reduction internal fixation of right supracondylar distal femur fracture  3/6- s/p BSE- recommend NPO with nutrition via alternative means 3/7- s/p BSE- NPO, cortrak placed (x-ray pending)  Reviewed I/O's: -128 ml x 24 hours and +989 ml since admission  UOP: 950 ml x 24 hours  Pt unavailable at time of visit. Attempted to speak with pt via call to hospital room phone, however, unable to reach. RD unable to obtain further nutrition-related history or complete nutrition-focused physical exam at this time. Noted that pt is deaf and mute and requires a sign language interpreter.   Per [facility] notes on 03/05/23, pt with stage 2 pressure injury which was present on  admission. Case discussed with RN, who reports pressure injury to sacrum.   Per chart review, pt has been lethargic since 03/06/23. Pt will open eyes, but not responding much; she has not been eating or drinking much. She has been refusing oral medications. Unsure of diet history PTA; will conservatively start TF slowly and monitor for potential refeeding.   Case discussed with cortrak team; cortrak has been placed, but x-ray has been ordered to confirm placement. Per cortrak RD, pt did not appear to be malnourished.   Case discussed with MD, who reports ok to start TF once tube placement has been verified. Chest x-ray has been ordered due to suspicion for aspiration.   Reviewed wt hx; no wt loss noted over the past 8 months.   Medications reviewed and include colace, lexapro, and ditropan.   Labs reviewed.    Diet Order:   Diet Order             Diet NPO time specified  Diet effective now                   EDUCATION NEEDS:   Not appropriate for education at this time  Skin:  Skin Assessment: Skin Integrity Issues: Skin Integrity Issues:: Stage II, Incisions Stage II: sacrum Incisions: closed rt leg  Last BM:  03/07/23  Height:   Ht Readings from Last 1 Encounters:  03/05/23 5\' 9"  (1.753 m)    Weight:   Wt Readings from Last 1 Encounters:  03/05/23  112.9 kg    Ideal Body Weight:  65.9 kg  BMI:  Body mass index is 36.77 kg/m.  Estimated Nutritional Needs:   Kcal:  1800-2000  Protein:  100-115 grams  Fluid:  1.8-2.0 L    Levada Schilling, RD, LDN, CDCES Registered Dietitian III Certified Diabetes Care and Education Specialist If unable to reach this RD, please use "RD Inpatient" group chat on secure chat between hours of 8am-4 pm daily

## 2023-03-09 NOTE — Progress Notes (Signed)
 Daughter, Amara Justen, gave verbal consent for blood transfusion Dr. Rhona Leavens notified. Consent placed in chart

## 2023-03-10 DIAGNOSIS — S72441A Displaced fracture of lower epiphysis (separation) of right femur, initial encounter for closed fracture: Secondary | ICD-10-CM | POA: Diagnosis not present

## 2023-03-10 DIAGNOSIS — R0600 Dyspnea, unspecified: Secondary | ICD-10-CM | POA: Diagnosis not present

## 2023-03-10 LAB — PHOSPHORUS
Phosphorus: 2.7 mg/dL (ref 2.5–4.6)
Phosphorus: 2.2 mg/dL — ABNORMAL LOW (ref 2.5–4.6)

## 2023-03-10 LAB — MAGNESIUM
Magnesium: 2.2 mg/dL (ref 1.7–2.4)
Magnesium: 2.3 mg/dL (ref 1.7–2.4)

## 2023-03-10 LAB — COMPREHENSIVE METABOLIC PANEL
ALT: <5 U/L (ref 0–44)
AST: 42 U/L — ABNORMAL HIGH (ref 15–41)
Albumin: 2 g/dL — ABNORMAL LOW (ref 3.5–5.0)
Alkaline Phosphatase: 35 U/L — ABNORMAL LOW (ref 38–126)
Anion gap: 12 (ref 5–15)
BUN: 52 mg/dL — ABNORMAL HIGH (ref 8–23)
CO2: 21 mmol/L — ABNORMAL LOW (ref 22–32)
Calcium: 7.8 mg/dL — ABNORMAL LOW (ref 8.9–10.3)
Chloride: 114 mmol/L — ABNORMAL HIGH (ref 98–111)
Creatinine, Ser: 1.78 mg/dL — ABNORMAL HIGH (ref 0.44–1.00)
GFR, Estimated: 28 mL/min — ABNORMAL LOW (ref >60)
Glucose, Bld: 136 mg/dL — ABNORMAL HIGH (ref 70–99)
Potassium: 4 mmol/L (ref 3.5–5.1)
Sodium: 147 mmol/L — ABNORMAL HIGH (ref 135–145)
Total Bilirubin: 0.5 mg/dL (ref 0.0–1.2)
Total Protein: 5.2 g/dL — ABNORMAL LOW (ref 6.5–8.1)

## 2023-03-10 LAB — BPAM RBC
Blood Product Expiration Date: 202504052359
ISSUE DATE / TIME: 202503071332
Unit Type and Rh: 5100

## 2023-03-10 LAB — TYPE AND SCREEN
ABO/RH(D): O POS
Antibody Screen: NEGATIVE
Unit division: 0

## 2023-03-10 LAB — GLUCOSE, CAPILLARY
Glucose-Capillary: 120 mg/dL — ABNORMAL HIGH (ref 70–99)
Glucose-Capillary: 123 mg/dL — ABNORMAL HIGH (ref 70–99)
Glucose-Capillary: 125 mg/dL — ABNORMAL HIGH (ref 70–99)
Glucose-Capillary: 133 mg/dL — ABNORMAL HIGH (ref 70–99)
Glucose-Capillary: 137 mg/dL — ABNORMAL HIGH (ref 70–99)
Glucose-Capillary: 137 mg/dL — ABNORMAL HIGH (ref 70–99)

## 2023-03-10 LAB — CBC
HCT: 25.2 % — ABNORMAL LOW (ref 36.0–46.0)
Hemoglobin: 7.9 g/dL — ABNORMAL LOW (ref 12.0–15.0)
MCH: 28.8 pg (ref 26.0–34.0)
MCHC: 31.3 g/dL (ref 30.0–36.0)
MCV: 92 fL (ref 80.0–100.0)
Platelets: 211 10*3/uL (ref 150–400)
RBC: 2.74 MIL/uL — ABNORMAL LOW (ref 3.87–5.11)
RDW: 18.9 % — ABNORMAL HIGH (ref 11.5–15.5)
WBC: 9 10*3/uL (ref 4.0–10.5)
nRBC: 1.5 % — ABNORMAL HIGH (ref 0.0–0.2)

## 2023-03-10 MED ORDER — PNEUMOCOCCAL 20-VAL CONJ VACC 0.5 ML IM SUSY
0.5000 mL | PREFILLED_SYRINGE | INTRAMUSCULAR | Status: DC
  Filled 2023-03-10: qty 0.5

## 2023-03-10 MED ORDER — ENOXAPARIN SODIUM 40 MG/0.4ML IJ SOSY
40.0000 mg | PREFILLED_SYRINGE | INTRAMUSCULAR | Status: DC
  Administered 2023-03-10 – 2023-03-17 (×8): 40 mg via SUBCUTANEOUS
  Filled 2023-03-10 (×8): qty 0.4

## 2023-03-10 NOTE — Plan of Care (Signed)
  Problem: Education: Goal: Knowledge of General Education information will improve Description: Including pain rating scale, medication(s)/side effects and non-pharmacologic comfort measures Outcome: Not Progressing   Problem: Health Behavior/Discharge Planning: Goal: Ability to manage health-related needs will improve Outcome: Not Progressing   Problem: Activity: Goal: Risk for activity intolerance will decrease Outcome: Not Progressing   Problem: Nutrition: Goal: Adequate nutrition will be maintained Outcome: Progressing

## 2023-03-10 NOTE — Progress Notes (Signed)
 Patient turned and repositioned every 2 hours. Mouth care attempted. However, patient clenches her mouth shut, which prevents oral care. Daughter at bedside explaining purpose to patient, but she remains unreceptive to mouth care.  Oral suctioning attempted multiple times after weak cough that resulted in rhonchi. Pt unreceptive to oral suctioning.

## 2023-03-10 NOTE — Progress Notes (Signed)
 Progress Note   Patient: Sharon Ramos GEX:[id number] DOB: 1940/02/18 DOA: 03/04/2023     6 DOS: the patient was seen and examined on 03/10/2023   Brief hospital course: 83 y.o. 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  CT of the knee showed comminuted periprosthetic distal femur fracture, orthopedic surgery was consulted, later did not  recommended surgical intervention.   Assessment and Plan: Closed fracture of right distal femur (HCC) Secondary to mechanical falls. Orthopedic surgery was consulted recommended status post surgical intervention on 03/06/2023 narcotics per BTK surgery. Analgesics and DVT prophylaxis per orthopedic surgery, Eliquis twice a day for 30 days. Narcotics were held secondary to increased lethargy recently, see below Anticipate skilled nursing facility placement.   Acute metabolic encephalopathy: Likely secondary to below sepsis with PNA. Abx continued per below Narcotics held Holding Robaxin and melatonin. CT of the head reviewed, neg Mentation seems to be improving. Waving back appropriately   Shortness of breath/confusion secondary to pneumonia with sepsis not present on admission: SARS-CoV-2 influenza and RSV PCR negative. Required up to 3 L of nasal cannula Pt with increased lethargy, with fevers up to 101.70F as of 3/6 and tachypnea Ordered and reviewed CXR, findings of L>R patchy basilar airspace disease with repeat CXR demonstrating worse changes Sepsis order set was initiated. Continue empiric azithro and rocephin Blood cx neg thus far Now afebrile. Clinically seems to be improving   Essential hypertension: Cont on norvasc as tolerated   New acute kidney injury on chronic kidney disease stage IV: Creatinine slightly  improved today likely hemodynamic mediated.  With a baseline creatinine around 2 peaked at 3. Cont on basal IVF Foley was recently placed empirically. Will d/c foley to reduce risk of UTI   Anxiety/delusion/auditory elucidation/Alzheimer's dementia: Continue Lexapro and Namenda.   OAB: Continue oxybutynin.   Morbid obesity: Noted   Mutism/staff: Noted   Sacral decubitus ulcer present on admission stage II: RN Pressure Injury Documentation:     Pressure Injury 03/04/23 Other (Comment) Medial Stage 2 -  Partial thickness loss of dermis presenting as a shallow open injury with a red, pink wound bed without slough. (Active)  03/04/23 2000  Location: Other (Comment)  Location Orientation: Medial  Staging: Stage 2 -  Partial thickness loss of dermis presenting as a shallow open injury with a red, pink wound bed without slough.  Wound Description (Comments):   Present on Admission: Yes  Dressing Type Foam - Lift dressing to assess site every shift 03/06/23 1930    Acute blood loss anemia -Likely secondary to recent surgery -hgb <7. Family agreed to transfusion -Transufse 1 unit. Follow CBC trends     Subjective: More alert and interactive this AM  Physical Exam: Vitals:   03/10/23 0502 03/10/23 0738 03/10/23 1122 03/10/23 1318  BP:  (!) 116/57 (!) 127/39 (!) 105/52  Pulse:  73 74 67  Resp:  (!) 23 (!) 25   Temp:  99.8 F (37.7 C) 99.4 F (37.4 C)   TempSrc:  Axillary Axillary   SpO2: 92% 98% 100% 100%  Weight:      Height:       General exam: Conversant, in no acute distress Respiratory system: normal chest rise, clear, no audible wheezing Cardiovascular system: regular rhythm, s1-s2 Gastrointestinal system: Nondistended, nontender, pos BS Central nervous system: No  seizures, no tremors Extremities: No cyanosis, no joint deformities Skin: No rashes, no pallor Psychiatry: Affect normal // no auditory hallucinations   Data Reviewed:  Labs reviewed: Na 147, K 4.0,  Cr 1.78, WBC 9.0, Hgb 7.9, Plts 211  Family Communication: Pt in room, family at bedside  Disposition: Status is: Inpatient Remains inpatient appropriate because: severity of illness  Planned Discharge Destination: Skilled nursing facility    Author: Rickey Barbara, MD 03/10/2023 1:55 PM  For on call review [email or web address].

## 2023-03-11 DIAGNOSIS — R0600 Dyspnea, unspecified: Secondary | ICD-10-CM | POA: Diagnosis not present

## 2023-03-11 DIAGNOSIS — S72441A Displaced fracture of lower epiphysis (separation) of right femur, initial encounter for closed fracture: Secondary | ICD-10-CM | POA: Diagnosis not present

## 2023-03-11 LAB — COMPREHENSIVE METABOLIC PANEL
ALT: <5 U/L (ref 0–44)
AST: 35 U/L (ref 15–41)
Albumin: 2 g/dL — ABNORMAL LOW (ref 3.5–5.0)
Alkaline Phosphatase: 35 U/L — ABNORMAL LOW (ref 38–126)
Anion gap: 7 (ref 5–15)
BUN: 51 mg/dL — ABNORMAL HIGH (ref 8–23)
CO2: 22 mmol/L (ref 22–32)
Calcium: 7.7 mg/dL — ABNORMAL LOW (ref 8.9–10.3)
Chloride: 119 mmol/L — ABNORMAL HIGH (ref 98–111)
Creatinine, Ser: 1.59 mg/dL — ABNORMAL HIGH (ref 0.44–1.00)
GFR, Estimated: 32 mL/min — ABNORMAL LOW (ref >60)
Glucose, Bld: 146 mg/dL — ABNORMAL HIGH (ref 70–99)
Potassium: 3.8 mmol/L (ref 3.5–5.1)
Sodium: 148 mmol/L — ABNORMAL HIGH (ref 135–145)
Total Bilirubin: 0.7 mg/dL (ref 0.0–1.2)
Total Protein: 5 g/dL — ABNORMAL LOW (ref 6.5–8.1)

## 2023-03-11 LAB — CBC
HCT: 24.2 % — ABNORMAL LOW (ref 36.0–46.0)
Hemoglobin: 7.5 g/dL — ABNORMAL LOW (ref 12.0–15.0)
MCH: 28.6 pg (ref 26.0–34.0)
MCHC: 31 g/dL (ref 30.0–36.0)
MCV: 92.4 fL (ref 80.0–100.0)
Platelets: 228 10*3/uL (ref 150–400)
RBC: 2.62 MIL/uL — ABNORMAL LOW (ref 3.87–5.11)
RDW: 18.8 % — ABNORMAL HIGH (ref 11.5–15.5)
WBC: 9.2 10*3/uL (ref 4.0–10.5)
nRBC: 1.5 % — ABNORMAL HIGH (ref 0.0–0.2)

## 2023-03-11 LAB — GLUCOSE, CAPILLARY
Glucose-Capillary: 101 mg/dL — ABNORMAL HIGH (ref 70–99)
Glucose-Capillary: 114 mg/dL — ABNORMAL HIGH (ref 70–99)
Glucose-Capillary: 118 mg/dL — ABNORMAL HIGH (ref 70–99)
Glucose-Capillary: 120 mg/dL — ABNORMAL HIGH (ref 70–99)
Glucose-Capillary: 121 mg/dL — ABNORMAL HIGH (ref 70–99)
Glucose-Capillary: 123 mg/dL — ABNORMAL HIGH (ref 70–99)
Glucose-Capillary: 125 mg/dL — ABNORMAL HIGH (ref 70–99)

## 2023-03-11 LAB — MAGNESIUM: Magnesium: 2.2 mg/dL (ref 1.7–2.4)

## 2023-03-11 LAB — PHOSPHORUS: Phosphorus: 2 mg/dL — ABNORMAL LOW (ref 2.5–4.6)

## 2023-03-11 MED ORDER — HYDROMORPHONE HCL 1 MG/ML IJ SOLN
0.5000 mg | INTRAMUSCULAR | Status: DC | PRN
  Administered 2023-03-14 – 2023-03-23 (×2): 0.5 mg via INTRAVENOUS
  Filled 2023-03-11 (×2): qty 1

## 2023-03-11 MED ORDER — FREE WATER
200.0000 mL | Status: DC
  Administered 2023-03-11 – 2023-03-12 (×5): 200 mL

## 2023-03-11 NOTE — Plan of Care (Signed)
  Problem: Education: Goal: Knowledge of General Education information will improve Description: Including pain rating scale, medication(s)/side effects and non-pharmacologic comfort measures Outcome: Progressing   Problem: Health Behavior/Discharge Planning: Goal: Ability to manage health-related needs will improve Outcome: Progressing   Problem: Clinical Measurements: Goal: Ability to maintain clinical measurements within normal limits will improve Outcome: Progressing Goal: Will remain free from infection Outcome: Progressing Goal: Diagnostic test results will improve Outcome: Progressing Goal: Respiratory complications will improve Outcome: Progressing Goal: Cardiovascular complication will be avoided Outcome: Progressing   Problem: Activity: Goal: Risk for activity intolerance will decrease Outcome: Progressing   Problem: Nutrition: Goal: Adequate nutrition will be maintained Outcome: Progressing   Problem: Coping: Goal: Level of anxiety will decrease Outcome: Progressing   Problem: Elimination: Goal: Will not experience complications related to bowel motility Outcome: Progressing Goal: Will not experience complications related to urinary retention Outcome: Progressing   Problem: Pain Managment: Goal: General experience of comfort will improve and/or be controlled Outcome: Progressing   Problem: Safety: Goal: Ability to remain free from injury will improve Outcome: Progressing   Problem: Skin Integrity: Goal: Risk for impaired skin integrity will decrease Outcome: Progressing   Problem: Activity: Goal: Ability to tolerate increased activity will improve Outcome: Progressing   Problem: Clinical Measurements: Goal: Ability to maintain a body temperature in the normal range will improve Outcome: Progressing   Problem: Respiratory: Goal: Ability to maintain adequate ventilation will improve Outcome: Progressing Goal: Ability to maintain a clear airway  will improve Outcome: Progressing   Problem: Fluid Volume: Goal: Hemodynamic stability will improve Outcome: Progressing   Problem: Clinical Measurements: Goal: Diagnostic test results will improve Outcome: Progressing Goal: Signs and symptoms of infection will decrease Outcome: Progressing   Problem: Respiratory: Goal: Ability to maintain adequate ventilation will improve Outcome: Progressing   Problem: Fluid Volume: Goal: Hemodynamic stability will improve Outcome: Progressing   Problem: Clinical Measurements: Goal: Diagnostic test results will improve Outcome: Progressing Goal: Signs and symptoms of infection will decrease Outcome: Progressing   Problem: Respiratory: Goal: Ability to maintain adequate ventilation will improve Outcome: Progressing   Problem: Activity: Goal: Ability to tolerate increased activity will improve Outcome: Progressing   Problem: Clinical Measurements: Goal: Ability to maintain a body temperature in the normal range will improve Outcome: Progressing   Problem: Respiratory: Goal: Ability to maintain adequate ventilation will improve Outcome: Progressing Goal: Ability to maintain a clear airway will improve Outcome: Progressing

## 2023-03-11 NOTE — Progress Notes (Signed)
 PT Cancellation Note  Patient Details Name: LOUISE RAWSON MRN: [id number] DOB: 1940-04-15   Cancelled Treatment:    Reason Eval/Treat Not Completed: Other (comment) (Pt with MD who states that he is about to order pain meds for pt. Requests PT to follow up later after pt has pain meds.)   Gladys Damme 03/11/2023, 11:05 AM

## 2023-03-11 NOTE — Progress Notes (Signed)
 Progress Note   Patient: Sharon Ramos VWU:[id number] DOB: 09-27-1940 DOA: 03/04/2023     7 DOS: the patient was seen and examined on 03/11/2023   Brief hospital course: 83 y.o. 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  CT of the knee showed comminuted periprosthetic distal femur fracture, orthopedic surgery was consulted, later did not  recommended surgical intervention.   Assessment and Plan: Closed fracture of right distal femur (HCC) Secondary to mechanical falls. Orthopedic surgery was consulted recommended status post surgical intervention on 03/06/2023 narcotics per BTK surgery. Analgesics and DVT prophylaxis per orthopedic surgery, Eliquis twice a day for 30 days. Narcotics were held secondary to increased lethargy recently, see below As pt's mentation has improved, will cautiously add PRN dilaudid for pain   Acute metabolic encephalopathy: Likely secondary to below sepsis with PNA. Abx continued per below Narcotics held Holding Robaxin and melatonin. CT of the head reviewed, neg Mentation seems to be improving, noted by family   Shortness of breath/confusion secondary to pneumonia with sepsis not present on admission: SARS-CoV-2 influenza and RSV PCR negative. Required up to 3 L of nasal cannula Pt with increased lethargy, with fevers up to 101.24F as of 3/6 and tachypnea Ordered and reviewed CXR, findings of L>R patchy basilar airspace disease with repeat CXR demonstrating worse changes Sepsis order set was initiated. Continue empiric azithro and rocephin Blood cx neg thus far Now afebrile. Clinically seems to be improving   Essential hypertension: Cont on norvasc as tolerated   New acute kidney injury on chronic kidney disease stage  IV: Creatinine slightly improved today likely hemodynamic mediated.  With a baseline creatinine around 2 peaked at 3. Cont on basal IVF Foley was recently placed empirically. Will d/c foley to reduce risk of UTI   Anxiety/delusion/auditory elucidation/Alzheimer's dementia: Continue Lexapro and Namenda.   OAB: Continue oxybutynin.   Morbid obesity: Noted   Mutism/staff: Noted   Sacral decubitus ulcer present on admission stage II: RN Pressure Injury Documentation:     Pressure Injury 03/04/23 Other (Comment) Medial Stage 2 -  Partial thickness loss of dermis presenting as a shallow open injury with a red, pink wound bed without slough. (Active)  03/04/23 2000  Location: Other (Comment)  Location Orientation: Medial  Staging: Stage 2 -  Partial thickness loss of dermis presenting as a shallow open injury with a red, pink wound bed without slough.  Wound Description (Comments):   Present on Admission: Yes  Dressing Type Foam - Lift dressing to assess site every shift 03/06/23 1930    Acute blood loss anemia -Likely secondary to recent surgery -improved after 1 unit PRBC transfusion     Subjective: More alert and interactive.   Physical Exam: Vitals:   03/11/23 0700 03/11/23 0707 03/11/23 0725 03/11/23 1236  BP:   (!) 117/50 (!) 111/58  Pulse:   69 75  Resp:   18 18  Temp:   98.5 F (36.9 C) 98.4 F (36.9 C)  TempSrc:   Oral Oral  SpO2: 94% 93% 100% 95%  Weight:      Height:       General exam: Awake, laying in bed, in nad Respiratory system: Normal respiratory effort, no wheezing Cardiovascular system: regular rate, s1, s2 Gastrointestinal system: Soft, nondistended, positive BS Central nervous system: CN2-12 grossly intact, strength  intact Extremities: Perfused, no clubbing Skin: Normal skin turgor, no notable skin lesions seen Psychiatry: Mood normal // no visual hallucinations   Data Reviewed:  Labs reviewed: Na 148, K 3.8, Cr 1.59, WBC 9.2, Hgb  7.5  Family Communication: Pt in room, family at bedside  Disposition: Status is: Inpatient Remains inpatient appropriate because: severity of illness  Planned Discharge Destination: Skilled nursing facility    Author: Rickey Barbara, MD 03/11/2023 3:22 PM  For on call review [email or web address].

## 2023-03-12 DIAGNOSIS — S72441A Displaced fracture of lower epiphysis (separation) of right femur, initial encounter for closed fracture: Secondary | ICD-10-CM | POA: Diagnosis not present

## 2023-03-12 DIAGNOSIS — R0600 Dyspnea, unspecified: Secondary | ICD-10-CM | POA: Diagnosis not present

## 2023-03-12 LAB — GLUCOSE, CAPILLARY
Glucose-Capillary: 105 mg/dL — ABNORMAL HIGH (ref 70–99)
Glucose-Capillary: 109 mg/dL — ABNORMAL HIGH (ref 70–99)
Glucose-Capillary: 110 mg/dL — ABNORMAL HIGH (ref 70–99)
Glucose-Capillary: 131 mg/dL — ABNORMAL HIGH (ref 70–99)
Glucose-Capillary: 122 mg/dL — ABNORMAL HIGH (ref 70–99)
Glucose-Capillary: 111 mg/dL — ABNORMAL HIGH (ref 70–99)

## 2023-03-12 LAB — CBC
HCT: 26.1 % — ABNORMAL LOW (ref 36.0–46.0)
Hemoglobin: 7.9 g/dL — ABNORMAL LOW (ref 12.0–15.0)
MCH: 28.4 pg (ref 26.0–34.0)
MCHC: 30.3 g/dL (ref 30.0–36.0)
MCV: 93.9 fL (ref 80.0–100.0)
Platelets: 275 10*3/uL (ref 150–400)
RBC: 2.78 MIL/uL — ABNORMAL LOW (ref 3.87–5.11)
RDW: 18.8 % — ABNORMAL HIGH (ref 11.5–15.5)
WBC: 10.3 10*3/uL (ref 4.0–10.5)
nRBC: 1.9 % — ABNORMAL HIGH (ref 0.0–0.2)

## 2023-03-12 LAB — COMPREHENSIVE METABOLIC PANEL
ALT: <5 U/L (ref 0–44)
AST: 30 U/L (ref 15–41)
Albumin: 2.2 g/dL — ABNORMAL LOW (ref 3.5–5.0)
Alkaline Phosphatase: 34 U/L — ABNORMAL LOW (ref 38–126)
Anion gap: 8 (ref 5–15)
BUN: 51 mg/dL — ABNORMAL HIGH (ref 8–23)
CO2: 22 mmol/L (ref 22–32)
Calcium: 8.1 mg/dL — ABNORMAL LOW (ref 8.9–10.3)
Chloride: 121 mmol/L — ABNORMAL HIGH (ref 98–111)
Creatinine, Ser: 1.5 mg/dL — ABNORMAL HIGH (ref 0.44–1.00)
GFR, Estimated: 35 mL/min — ABNORMAL LOW (ref >60)
Glucose, Bld: 108 mg/dL — ABNORMAL HIGH (ref 70–99)
Potassium: 4 mmol/L (ref 3.5–5.1)
Sodium: 151 mmol/L — ABNORMAL HIGH (ref 135–145)
Total Bilirubin: 0.6 mg/dL (ref 0.0–1.2)
Total Protein: 5.4 g/dL — ABNORMAL LOW (ref 6.5–8.1)

## 2023-03-12 MED ORDER — SODIUM CHLORIDE 0.9 % IV SOLN: 500.0000 mg | Freq: Once | INTRAVENOUS | Status: DC

## 2023-03-12 MED ORDER — FREE WATER
150.0000 mL | Status: DC
  Administered 2023-03-12 – 2023-03-13 (×12): 150 mL

## 2023-03-12 MED ORDER — GERHARDT'S BUTT CREAM
TOPICAL_CREAM | Freq: Two times a day (BID) | CUTANEOUS | Status: DC
  Administered 2023-03-22: 1 via TOPICAL
  Filled 2023-03-12 (×3): qty 60

## 2023-03-12 NOTE — Progress Notes (Signed)
 SLP Cancellation Note  Patient Details Name: Sharon Ramos MRN: [id number] DOB: [birth year]   Cancelled treatment:       Reason Eval/Treat Not Completed: Patient at procedure or test/unavailable. Waited outside room for over 20 min. Pt still occupied in nursing care. Will f/u   Mirinda Monte, Riley Nearing 03/12/2023, 2:24 PM

## 2023-03-12 NOTE — Progress Notes (Signed)
 Physical Therapy Treatment Patient Details Name: Sharon Ramos MRN: [id number] DOB: 05/29/1940 Today's Date: 03/12/2023   History of Present Illness Patient is a 83 year old female with  ground-level fall with a right supracondylar periprosthetic distal femur fracture. S/p ORIF.  History of hypertension, CKD 4, anxiety, hallucination, Alzheimer's dementia, anemia, deaf, mute, OAB, and obesity.    PT Comments  Pt, daughter, RN and SLP interpreter in room on entry. While RN administered medication, PT discussed discharge options with daughter. Daughter is concerned about discharge to SNF will exacerbate her underlying dementia. PT agreed that pt's with dementia often do better in their home environment. Daughter reports that she will work on arranging 24/7 care. Pt with increased R LE pain with movement that decreases with PROM. Pt requires total A for getting to EOB. Pt able to sit for ~8 min progressing to about 30 sec of unassisted sitting balance. Pt becomes very fatigued almost falling asleep sitting up and requires totalA to return to bed.  Pt will benefit from <3 hrs inpatient rehab at discharge if family is not able to provide 24/7 assist. PT will continue to follow acutely.    If plan is discharge home, recommend the following: Two people to help with walking and/or transfers;Two people to help with bathing/dressing/bathroom;Assistance with cooking/housework;Assistance with feeding;Direct supervision/assist for medications management;Direct supervision/assist for financial management;Assist for transportation;Help with stairs or ramp for entrance;Supervision due to cognitive status   Can travel by private vehicle     No  Equipment Recommendations  Hoyer lift;Hospital bed;Wheelchair (measurements PT);Wheelchair cushion (measurements PT);BSC/3in1       Precautions / Restrictions Precautions Precautions: Fall Recall of Precautions/Restrictions: Impaired Restrictions Weight Bearing  Restrictions Per Provider Order: Yes RLE Weight Bearing Per Provider Order: Weight bearing as tolerated     Mobility  Bed Mobility Overal bed mobility: Needs Assistance Bed Mobility: Supine to Sit, Sit to Supine     Supine to sit: Total assist, HOB elevated Sit to supine: Total assist   General bed mobility comments: pt with minimal A for managing LE off bed and bringing trunk to upright, and for bringing LE back into bed, and scooting up in bed    Transfers                   General transfer comment: unable/unsafe to attempt at this time due to poor participation        Balance Overall balance assessment: Needs assistance Sitting-balance support: Feet supported Sitting balance-Leahy Scale: Poor Sitting balance - Comments: initially requiring total A for maintaining balance on EoB, with assist for hand placement on footboard and and hand rail from elevated HoB pt able to achieve balance                                    Communication Communication Communication: Impaired Factors Affecting Communication: Hearing impaired;Difficulty expressing self (difficulty with signing with ASL (likely due to impaired cognition as well as weakness))  Cognition Arousal: Alert Behavior During Therapy: Flat affect   PT - Cognitive impairments: Difficult to assess Difficult to assess due to: Hard of hearing/deaf                     PT - Cognition Comments: Daughter Olegario Messier, and in-house ASL interpreter aided with translation Following commands: Impaired Following commands impaired: Follows one step commands inconsistently    Cueing Cueing Techniques: Verbal cues,  Gestural cues, Tactile cues, Visual cues  Exercises General Exercises - Lower Extremity Hip Flexion/Marching: PROM, Left, 10 reps    General Comments General comments (skin integrity, edema, etc.): daughter Olegario Messier in room, discussed discharge planning given pt dementia, daughter is leaning towards  going home with maximal equipment, VSS      Pertinent Vitals/Pain Pain Assessment Pain Assessment: Faces Faces Pain Scale: Hurts even more Breathing: occasional labored breathing, short period of hyperventilation Negative Vocalization: none Facial Expression: facial grimacing Body Language: relaxed Consolability: distracted or reassured by voice/touch PAINAD Score: 4 Pain Location: B LE with initial movement, improved with use Pain Descriptors / Indicators: Grimacing, Guarding, Moaning Pain Intervention(s): Repositioned, Limited activity within patient's tolerance     PT Goals (current goals can now be found in the care plan section) Acute Rehab PT Goals Patient Stated Goal: patient unable to participate with goal setting PT Goal Formulation: Patient unable to participate in goal setting Time For Goal Achievement: 03/20/23 Potential to Achieve Goals: Fair Progress towards PT goals: Progressing toward goals    Frequency    Min 2X/week       AM-PAC PT "6 Clicks" Mobility   Outcome Measure  Help needed turning from your back to your side while in a flat bed without using bedrails?: Total Help needed moving from lying on your back to sitting on the side of a flat bed without using bedrails?: Total Help needed moving to and from a bed to a chair (including a wheelchair)?: Total Help needed standing up from a chair using your arms (e.g., wheelchair or bedside chair)?: Total Help needed to walk in hospital room?: Total Help needed climbing 3-5 steps with a railing? : Total 6 Click Score: 6    End of Session Equipment Utilized During Treatment: Oxygen Activity Tolerance: Patient limited by fatigue;Patient limited by lethargy Patient left: in bed;with call bell/phone within reach;with bed alarm set (SCD on LLE) Nurse Communication: Mobility status PT Visit Diagnosis: Muscle weakness (generalized) (M62.81);Unsteadiness on feet (R26.81)     Time: 4098-1191 PT Time  Calculation (min) (ACUTE ONLY): 47 min  Charges:    $Therapeutic Activity: 8-22 mins PT General Charges $$ ACUTE PT VISIT: 1 Visit                     Rhea Thrun B. Beverely Risen PT, DPT Acute Rehabilitation Services Please use secure chat or  Call Office [phone]    Elon Alas [facility] 03/12/2023, 12:45 PM

## 2023-03-12 NOTE — Progress Notes (Signed)
 Progress Note   Patient: Sharon Ramos ZOX:[id number] DOB: 29-Aug-1940 DOA: 03/04/2023     8 DOS: the patient was seen and examined on 03/12/2023   Brief hospital course: 83 y.o. 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  CT of the knee showed comminuted periprosthetic distal femur fracture, orthopedic surgery was consulted, later did not  recommended surgical intervention.   Assessment and Plan: Closed fracture of right distal femur (HCC) Secondary to mechanical falls. Orthopedic surgery was consulted recommended status post surgical intervention on 03/06/2023 narcotics per BTK surgery. Analgesics and DVT prophylaxis per orthopedic surgery, Eliquis twice a day for 30 days. Continue with analgesia as needed   Acute metabolic encephalopathy: Likely secondary to below sepsis with PNA.  Held Robaxin and melatonin. CT of the head reviewed, neg Mentation now much improved, following commands   Shortness of breath/confusion secondary to pneumonia with sepsis not present on admission: SARS-CoV-2 influenza and RSV PCR negative. Required up to 3 L of nasal cannula Pt with increased lethargy, with fevers up to 101.68F as of 3/6 and tachypnea Ordered and reviewed CXR, findings of L>R patchy basilar airspace disease with repeat CXR demonstrating worse changes Sepsis order set was initiated. Continue empiric azithro and rocephin Blood cx neg thus far Afebrile. Improving clinically   Essential hypertension: Cont on norvasc as tolerated   New acute kidney injury on chronic kidney disease stage IV: Creatinine slightly improved today likely hemodynamic mediated.  With a baseline creatinine around 2 peaked at 3. Renal function improved with hydration    Anxiety/delusion/auditory elucidation/Alzheimer's dementia: Continue Lexapro and Namenda.   OAB: Continue oxybutynin.   Morbid obesity: Noted  Hyponatremia -Na trending up to 151 today. -Have increased free water flushes to 150cc q2h -Recheck bmet in AM   Sacral decubitus ulcer present on admission stage II: RN Pressure Injury Documentation:     Pressure Injury 03/04/23 Other (Comment) Medial Stage 2 -  Partial thickness loss of dermis presenting as a shallow open injury with a red, pink wound bed without slough. (Active)  03/04/23 2000  Location: Other (Comment)  Location Orientation: Medial  Staging: Stage 2 -  Partial thickness loss of dermis presenting as a shallow open injury with a red, pink wound bed without slough.  Wound Description (Comments):   Present on Admission: Yes  Dressing Type Foam - Lift dressing to assess site every shift 03/06/23 1930    Acute blood loss anemia -Likely secondary to recent surgery -improved after 1 unit PRBC transfusion     Subjective: Alert and interactive  Physical Exam: Vitals:   03/12/23 0435 03/12/23 0806 03/12/23 0947 03/12/23 1156  BP: (!) 126/57 (!) 130/95 (!) 130/95 (!) 124/54  Pulse: 62 62    Resp: 16 16  18   Temp: (!) 97.5 F (36.4 C)     TempSrc: Oral Oral  Oral  SpO2: 95% 96%    Weight: 114.4 kg     Height:       General exam: Conversant, in no acute distress Respiratory system: normal chest rise, clear, no audible wheezing Cardiovascular system: regular rhythm, s1-s2 Gastrointestinal system: Nondistended, nontender, pos BS Central nervous system: No seizures, no tremors Extremities: No cyanosis, no joint deformities Skin: No rashes, no pallor Psychiatry: Affect normal // no auditory hallucinations   Data Reviewed:  Labs reviewed:  Na 151, K 4.0, Cr 1.50, WBC 10.3, Hgb 7.9, Plts 275  Family Communication: Pt in room, family at bedside  Disposition: Status is: Inpatient Remains inpatient appropriate  because: severity of illness  Planned Discharge Destination: Skilled nursing facility    Author: Rickey Barbara, MD 03/12/2023 3:33 PM  For on call review [email or web address].

## 2023-03-12 NOTE — Plan of Care (Signed)
  Problem: Clinical Measurements: Goal: Ability to maintain clinical measurements within normal limits will improve Outcome: Progressing Goal: Will remain free from infection Outcome: Progressing Goal: Diagnostic test results will improve Outcome: Progressing Goal: Cardiovascular complication will be avoided Outcome: Progressing   

## 2023-03-12 NOTE — Progress Notes (Signed)
 Occupational Therapy Treatment Patient Details Name: Sharon Ramos MRN: [id number] DOB: April 28, 1940 Today's Date: 03/12/2023   History of present illness Patient is a 83 year old female with  ground-level fall with a right supracondylar periprosthetic distal femur fracture. S/p ORIF.  History of hypertension, CKD 4, anxiety, hallucination, Alzheimer's dementia, anemia, deaf, mute, OAB, and obesity.   OT comments  Pt's daughter and NT present upon arrival requesting assist to turn pt in bed for pericare. Total A +2 for rolling in be multiple times for perihygiene and linen change, requiring extensive time to complete. RN com,ing in to change soiled sacral dressing. OT discussed discharge options/plans with pt's daughter and she expressed concern about discharging to SNF likely exacerbating  her underlying dementia which is understandable. Pt's daughter reports that she will work on 24/7 home care. Recommend ST SNF rehab after acute stay d/c if family unable to provide 24/7 assist. OT will continue to follow acutely to maximize level of function and safety      If plan is discharge home, recommend the following:  Two people to help with walking and/or transfers;Two people to help with bathing/dressing/bathroom;Assistance with cooking/housework;Assistance with feeding;Direct supervision/assist for medications management;Direct supervision/assist for financial management;Assist for transportation;Supervision due to cognitive status   Equipment Recommendations  Other (comment) (TBD at next venue of care)    Recommendations for Other Services      Precautions / Restrictions Precautions Precautions: Fall;Other (comment) Recall of Precautions/Restrictions: Impaired Precaution/Restrictions Comments: pt is deaf, daughter uses sign language to communicate with her Restrictions Weight Bearing Restrictions Per Provider Order: Yes RLE Weight Bearing Per Provider Order: Weight bearing as tolerated        Mobility Bed Mobility Overal bed mobility: Needs Assistance Bed Mobility: Rolling     Supine to sit: Total assist, +2 for physical assistance, HOB elevated     General bed mobility comments: total A +2 multiple times rolling in bed. NT and OT assisted for pericare after BM, linen changes    Transfers                   General transfer comment: unable/unsafe to attempt at this time due to poor participation     Balance                                           ADL either performed or assessed with clinical judgement   ADL                                         General ADL Comments: total A    Extremity/Trunk Assessment              Vision Patient Visual Report: No change from baseline     Perception     Praxis     Communication Communication Communication: Impaired Factors Affecting Communication: Hearing impaired;Difficulty expressing self   Cognition Arousal: Alert Behavior During Therapy: Flat affect Cognition: History of cognitive impairments, No family/caregiver present to determine baseline             OT - Cognition Comments: Per chart mild dementia, no family present to determine baseline, communication barriers limited                 Following commands: Impaired Following  commands impaired: Follows one step commands inconsistently      Cueing   Cueing Techniques: Verbal cues, Gestural cues, Tactile cues, Visual cues  Exercises      Shoulder Instructions       General Comments daughter Olegario Messier in room, discussed discharge planning given pt dementia, daughter is leaning towards going home with maximal equipment, VSS    Pertinent Vitals/ Pain       Pain Assessment Pain Assessment: Faces Faces Pain Scale: Hurts even more Pain Location: B LE with movement for rolling and pericare Pain Descriptors / Indicators: Grimacing, Guarding, Moaning Pain Intervention(s): Monitored during  session, Limited activity within patient's tolerance, Repositioned  Home Living                                          Prior Functioning/Environment              Frequency  Min 1X/week        Progress Toward Goals  OT Goals(current goals can now be found in the care plan section)  Progress towards OT goals: OT to reassess next treatment     Plan      Co-evaluation                 AM-PAC OT "6 Clicks" Daily Activity     Outcome Measure   Help from another person eating meals?: Total Help from another person taking care of personal grooming?: Total Help from another person toileting, which includes using toliet, bedpan, or urinal?: Total Help from another person bathing (including washing, rinsing, drying)?: Total Help from another person to put on and taking off regular upper body clothing?: Total Help from another person to put on and taking off regular lower body clothing?: Total 6 Click Score: 6    End of Session    OT Visit Diagnosis: Muscle weakness (generalized) (M62.81);History of falling (Z91.81);Feeding difficulties (R63.3);Other symptoms and signs involving cognitive function   Activity Tolerance Patient limited by pain   Patient Left in bed;with call bell/phone within reach;with family/visitor present;with nursing/sitter in room   Nurse Communication          Time: 8295-6213 OT Time Calculation (min): 29 min  Charges: OT General Charges $OT Visit: 1 Visit OT Treatments $Self Care/Home Management : 8-22 mins $Therapeutic Activity: 8-22 mins    Galen Manila 03/12/2023, 2:53 PM

## 2023-03-12 NOTE — TOC Progression Note (Signed)
 Transition of Care [facility]) - Progression Note    Patient Details  Name: Sharon Ramos MRN: [id number] Date of Birth: Dec 16, 1940  Transition of Care [facility]) CM/SW Contact  Graves-Bigelow, Lamar Laundry, RN Phone Number: 03/12/2023, 2:51 PM  Clinical Narrative: Case Manager spoke with daughter in the room regarding home health vs SNF. Daughter has chosen home health services and she chose [facility] for PT/OT/Aide Services-referral made and start of care to begin within 24-48 hours post transition of care needs. Daughter wants to use [employer] for DME hospital bed, hoyer lift, wheelchair, and bedside commode. DME ordered and submitted to [employer]. Case Manager will continue to follow for additional transition of care needs as the patient progresses.   HH RN added for services via [place]. Agency is aware.   Expected Discharge Plan: Home w Home Health Services Barriers to Discharge: Continued Medical Work up, SNF Pending bed offer  Expected Discharge Plan and Services In-house Referral: Clinical Social Work Discharge Planning Services: CM Consult Post Acute Care Choice: Home Health, Durable Medical Equipment Living arrangements for the past 2 months: Single Family Home                 DME Arranged: Hospital bed, High strength lightweight manual wheelchair with seat cushion, Bedside commode (hoyer lift) DME Agency: [employer] Date DME Agency Contacted: 03/12/23 Time DME Agency Contacted: [phone] Representative spoke with at DME Agency: Vaughan Basta HH Arranged: PT, OT, Nurse's Aide HH Agency: [facility] Date [facility] Agency Contacted: 03/12/23 Time HH Agency Contacted: 1335 Representative spoke with at [facility] Agency: Kandee Keen  Social Determinants of Health (SDOH) Interventions SDOH Screenings   Food Insecurity: No Food Insecurity (03/04/2023)  Housing: Low Risk  (03/04/2023)  Transportation Needs: No Transportation Needs (03/04/2023)  Utilities: Not At Risk (03/04/2023)  Social Connections:  Moderately Isolated (03/04/2023)  Tobacco Use: Low Risk  (03/05/2023)    Readmission Risk Interventions     No data to display

## 2023-03-12 NOTE — TOC Progression Note (Addendum)
 Transition of Care [facility]) - Progression Note    Patient Details  Name: Sharon Ramos MRN: [id number] Date of Birth: 22-Apr-1940  Transition of Care [facility]) CM/SW Contact  Michaela Corner, [place] Phone Number: 03/12/2023, 10:41 AM  Clinical Narrative:   CSW spoke with Sharon Ramos, pts dtr, about plans for DC - HH vs SNF. Per Sharon Ramos, she wants to see how her mother progresses when working with PT. Sharon Ramos stated she did receive bed offers, CSW informed Sharon Ramos that there are still only 2 accepting facilities at this time. CSW will follow up on family decision of HH vs SNF at a later time. Sharon Ramos expressed interest in [facility], CSW notified RNCM.   TOC will continue to follow.   Expected Discharge Plan: Skilled Nursing Facility Barriers to Discharge: Continued Medical Work up, SNF Pending bed offer  Expected Discharge Plan and Services In-house Referral: Clinical Social Work   Post Acute Care Choice: Skilled Nursing Facility Living arrangements for the past 2 months: Single Family Home                                       Social Determinants of Health (SDOH) Interventions SDOH Screenings   Food Insecurity: No Food Insecurity (03/04/2023)  Housing: Low Risk  (03/04/2023)  Transportation Needs: No Transportation Needs (03/04/2023)  Utilities: Not At Risk (03/04/2023)  Social Connections: Moderately Isolated (03/04/2023)  Tobacco Use: Low Risk  (03/05/2023)    Readmission Risk Interventions     No data to display

## 2023-03-13 DIAGNOSIS — S72441A Displaced fracture of lower epiphysis (separation) of right femur, initial encounter for closed fracture: Secondary | ICD-10-CM | POA: Diagnosis not present

## 2023-03-13 DIAGNOSIS — R0600 Dyspnea, unspecified: Secondary | ICD-10-CM | POA: Diagnosis not present

## 2023-03-13 LAB — GLUCOSE, CAPILLARY
Glucose-Capillary: 106 mg/dL — ABNORMAL HIGH (ref 70–99)
Glucose-Capillary: 115 mg/dL — ABNORMAL HIGH (ref 70–99)
Glucose-Capillary: 119 mg/dL — ABNORMAL HIGH (ref 70–99)
Glucose-Capillary: 121 mg/dL — ABNORMAL HIGH (ref 70–99)
Glucose-Capillary: 121 mg/dL — ABNORMAL HIGH (ref 70–99)
Glucose-Capillary: 129 mg/dL — ABNORMAL HIGH (ref 70–99)

## 2023-03-13 LAB — COMPREHENSIVE METABOLIC PANEL
ALT: <5 U/L (ref 0–44)
AST: 27 U/L (ref 15–41)
Albumin: 2.1 g/dL — ABNORMAL LOW (ref 3.5–5.0)
Alkaline Phosphatase: 43 U/L (ref 38–126)
Anion gap: 11 (ref 5–15)
BUN: 53 mg/dL — ABNORMAL HIGH (ref 8–23)
CO2: 22 mmol/L (ref 22–32)
Calcium: 8.4 mg/dL — ABNORMAL LOW (ref 8.9–10.3)
Chloride: 118 mmol/L — ABNORMAL HIGH (ref 98–111)
Creatinine, Ser: 1.48 mg/dL — ABNORMAL HIGH (ref 0.44–1.00)
GFR, Estimated: 35 mL/min — ABNORMAL LOW (ref >60)
Glucose, Bld: 114 mg/dL — ABNORMAL HIGH (ref 70–99)
Potassium: 3.9 mmol/L (ref 3.5–5.1)
Sodium: 151 mmol/L — ABNORMAL HIGH (ref 135–145)
Total Bilirubin: 0.6 mg/dL (ref 0.0–1.2)
Total Protein: 5.4 g/dL — ABNORMAL LOW (ref 6.5–8.1)

## 2023-03-13 LAB — CULTURE, BLOOD (ROUTINE X 2)
Culture: NO GROWTH
Culture: NO GROWTH

## 2023-03-13 LAB — CBC
HCT: 27.2 % — ABNORMAL LOW (ref 36.0–46.0)
Hemoglobin: 8.1 g/dL — ABNORMAL LOW (ref 12.0–15.0)
MCH: 28.7 pg (ref 26.0–34.0)
MCHC: 29.8 g/dL — ABNORMAL LOW (ref 30.0–36.0)
MCV: 96.5 fL (ref 80.0–100.0)
Platelets: 334 10*3/uL (ref 150–400)
RBC: 2.82 MIL/uL — ABNORMAL LOW (ref 3.87–5.11)
RDW: 19.3 % — ABNORMAL HIGH (ref 11.5–15.5)
WBC: 11.2 10*3/uL — ABNORMAL HIGH (ref 4.0–10.5)
nRBC: 3.4 % — ABNORMAL HIGH (ref 0.0–0.2)

## 2023-03-13 MED ORDER — FREE WATER
200.0000 mL | Status: DC
  Administered 2023-03-13 – 2023-03-15 (×31): 200 mL

## 2023-03-13 MED ORDER — ACETAMINOPHEN 325 MG PO TABS
650.0000 mg | ORAL_TABLET | Freq: Four times a day (QID) | ORAL | Status: DC
  Administered 2023-03-13 – 2023-03-17 (×14): 650 mg
  Filled 2023-03-13 (×13): qty 2

## 2023-03-13 MED ORDER — BANATROL TF EN LIQD
60.0000 mL | Freq: Two times a day (BID) | ENTERAL | Status: DC
  Administered 2023-03-13 – 2023-03-21 (×12): 60 mL
  Filled 2023-03-13 (×13): qty 60

## 2023-03-13 NOTE — Progress Notes (Signed)
 Speech Language Pathology Treatment: Dysphagia  Patient Details Name: Sharon Ramos MRN: [id number] DOB: May 20, 1940 Today's Date: 03/13/2023 Time: 7322-0254 SLP Time Calculation (min) (ACUTE ONLY): 19 min  Assessment / Plan / Recommendation Clinical Impression  Pt now alert, signing to ASL interpreter and daughter. Following most commands, but still needed cues at times for effective cough. Pt is attentive to PO, but often needs cues to initiate a swallow without awareness that she hasn't wallowing. She furthermore has immediate weak coughing after every sip of thin liquid. When given puree and honey thick water there is no immediate coughing and vocal quality remained dry. Pt is recommended to consume purees and honey thick liquid. Will f/u tomorrow to check for tolerance and need for instrumental testing if signs of aspiration persist. Daughter reports at baseline pt does not have trouble swallowing.   HPI HPI: BHUMI Ramos is an 83 y.o. 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  CT of the knee showed comminuted periprosthetic distal femur fracture, orthopedic surgery completed on 3/4.      SLP Plan  Continue with current plan of care      Recommendations for follow up therapy are one component of a multi-disciplinary discharge planning process, led by the attending physician.  Recommendations may be updated based on patient status, additional functional criteria and insurance authorization.    Recommendations  Diet recommendations: Honey-thick liquid;Dysphagia 1 (puree) Liquids provided via: Teaspoon Medication Administration: Via alternative means Supervision: Patient able to self feed;Full supervision/cueing for compensatory strategies Compensations:  Slow rate Postural Changes and/or Swallow Maneuvers: Seated upright 90 degrees                              Continue with current plan of care     Jadae Steinke, Riley Nearing  03/13/2023, 11:16 AM

## 2023-03-13 NOTE — Plan of Care (Signed)

## 2023-03-13 NOTE — Care Management Important Message (Signed)
 Important Message  Patient Details  Name: Sharon Ramos MRN: [id number] Date of Birth: 02-Nov-1940   Important Message Given:  Yes - Medicare IM     Renie Ora 03/13/2023, 10:41 AM

## 2023-03-13 NOTE — Progress Notes (Signed)
 Progress Note   Patient: Sharon Ramos UJW:[id number] DOB: 1940/03/01 DOA: 03/04/2023     9 DOS: the patient was seen and examined on 03/13/2023   Brief hospital course: 83 y.o. 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  CT of the knee showed comminuted periprosthetic distal femur fracture, orthopedic surgery was consulted, later did not  recommended surgical intervention.   Assessment and Plan: Closed fracture of right distal femur (HCC) Secondary to mechanical falls. Orthopedic surgery was consulted recommended status post surgical intervention on 03/06/2023 narcotics per BTK surgery. Analgesics and DVT prophylaxis per orthopedic surgery, Eliquis twice a day for 30 days. Continue with analgesia as needed   Acute metabolic encephalopathy: Likely secondary to below sepsis with PNA.  Held Robaxin and melatonin. CT of the head reviewed, neg Mentation now much improved, conversing and following commands   Shortness of breath/confusion secondary to pneumonia with sepsis not present on admission: On 3/6, p t had increased lethargy, with fevers up to 101.65F as of 3/6 and tachypnea Ordered and reviewed CXR, findings of L>R patchy basilar airspace disease with repeat CXR demonstrating worse changes SARS-CoV-2 influenza and RSV PCR negative. Now much improved with empiric azithro and rocephin   Essential hypertension: Cont on norvasc as tolerated   New acute kidney injury on chronic kidney disease stage IV: Creatinine slightly improved today likely hemodynamic mediated.  With a baseline creatinine around 2 peaked at 3. Renal function improved with hydration   Anxiety/delusion/auditory elucidation/Alzheimer's dementia: Continue Lexapro and Namenda.   OAB: Continue  oxybutynin.   Morbid obesity: Noted  Hyponatremia -Na remains elevated at 151 today. -Have increased free water flushes to 200cc q2h -Recheck bmet in AM   Sacral decubitus ulcer present on admission stage II: RN Pressure Injury Documentation:     Pressure Injury 03/04/23 Other (Comment) Medial Stage 2 -  Partial thickness loss of dermis presenting as a shallow open injury with a red, pink wound bed without slough. (Active)  03/04/23 2000  Location: Other (Comment)  Location Orientation: Medial  Staging: Stage 2 -  Partial thickness loss of dermis presenting as a shallow open injury with a red, pink wound bed without slough.  Wound Description (Comments):   Present on Admission: Yes  Dressing Type Foam - Lift dressing to assess site every shift 03/06/23 1930    Acute blood loss anemia -Likely secondary to recent surgery -improved after 1 unit PRBC transfusion -recheck bmet in AM  FEN -Had been maintained on tube feed -Pt now more alert. Clear for dys 1 with thickened liquids per SLP -once pt reliably tolerates PO, would then remove coretrak     Subjective: More alert, conversing through sign language, feeling hungry. Wants a banana  Physical Exam: Vitals:   03/12/23 2324 03/13/23 0450 03/13/23 0735 03/13/23 1233  BP: (!) 135/50 (!) 118/48 (!) 130/55 131/60  Pulse: 65 61  (!) 59  Resp: (!) 196 16 16 18   Temp: 98.1 F (36.7 C) 99.3 F (37.4 C) 98.8 F (37.1 C) (!) 97.2 F (36.2 C)  TempSrc: Oral Axillary Oral Oral  SpO2: 90% 97%  100%  Weight:  114.6 kg    Height:       General exam: Awake, laying in bed, in nad Respiratory system: Normal respiratory effort, no wheezing Cardiovascular system: regular rate, s1,  s2 Gastrointestinal system: Soft, nondistended, positive BS Central nervous system: CN2-12 grossly intact, strength intact Extremities: Perfused, no clubbing Skin: Normal skin turgor, no notable skin lesions seen Psychiatry: Mood normal // no visual  hallucinations   Data Reviewed:  Labs reviewed: Na 151, K 3.9, Cr 1.48, WBC 11.2, Hgb 8.1, Plts 334  Family Communication: Pt in room, family at bedside  Disposition: Status is: Inpatient Remains inpatient appropriate because: severity of illness  Planned Discharge Destination: Skilled nursing facility    Author: Rickey Barbara, MD 03/13/2023 2:42 PM  For on call review [email or web address].

## 2023-03-13 NOTE — Progress Notes (Addendum)
 Nutrition Follow-up  DOCUMENTATION CODES:   Obesity unspecified  INTERVENTION:  Continue TF via Cortrak: Osmolite 1.5 @ 50 ml/hr 60 ml Prosource TF BID 150 ml free water flush every 4 hours Tube feeding regimen provides 1960 kcal (100% of needs), 115 grams of protein, and 914 ml of H2O. Total free water: 1814 ml daily   Banatrol BID  Monitor po intake on dysphagia 1 diet; advance per SLP recommendations Magic cup TID with meals, each supplement provides 290 kcal and 9 grams of protein MVI with minerals daily Continue vitamin C and zinc as ordered Continue 100 mg thiamine daily as ordered Monitor Mg, K, and Phos and replete as needed secondary to high refeeding risk    NUTRITION DIAGNOSIS:   Inadequate oral intake related to inability to eat, dysphagia as evidenced by NPO status.  ongoing  GOAL:   Patient will meet greater than or equal to 90% of their needs  Being met through TF  MONITOR:   Diet advancement, TF tolerance  REASON FOR ASSESSMENT:   Consult Assessment of nutrition requirement/status  ASSESSMENT:   Pt with medical history significant of hypertension, CKD 4, anxiety, hallucination, delusion, Alzheimer's dementia, anemia, deaf, mute, OAB, obesity presenting with multiple falls and knee pain.  3/3- s/p Open reduction internal fixation of right supracondylar distal femur fracture  3/6- s/p BSE- recommend NPO with nutrition via alternative means 3/7: cortrak placed 3/11: diet advanced to dysphagia 1 with honey thick liquids  Pt's daughter at bedside to translate for pt and provide nutrition hx. Pt's daughter verbilized frusturation with care pt has gotten today and pt's lunch tray not arriving yet. She reports pt is ready to eat food. Daughter reports that pt has never had issues with eating/appetite PTA. She reports that pt eats 2 meals a day and snacks throughout the rest of the day. Daughter reports that pt drinks a lot of fresh juices at home that  another family member prepares for her. Daughter reports pt does not drink any nutrition supplements at home but pt reports she would be open to trying magic cup while here. Daughter reports pt has been having diarrhea since tube feeds have started and is still experiencing loose stools. Per chart review pt was having type 6 stools prior to tube feed initiation and is now experiencing type 7 the past few days. Noted pt's bowel regimen includes colace BID and Miralax ordered PRN.  Pt's daughter reports pt has had no recent weight loss.  Meds: vitamin c, zinc, colace, prosource BID per tube, MVI, thiamine, osmolite 1.5 50 ml/hr, reglan PRN, miralax PRN  Labs: CBGs range 109-131 in last 24 hours, sodium high (151), BUN 53, Creatinine 1.48; last Phosphorus 2.0 on 3/9, Mg WDL on 3/9  NUTRITION - FOCUSED PHYSICAL EXAM:  Flowsheet Row Most Recent Value  Orbital Region No depletion  Upper Arm Region Severe depletion  Thoracic and Lumbar Region No depletion  Buccal Region No depletion  Temple Region Severe depletion  Clavicle Bone Region No depletion  Clavicle and Acromion Bone Region No depletion  Scapular Bone Region No depletion  Dorsal Hand No depletion  Patellar Region No depletion  Anterior Thigh Region No depletion  Posterior Calf Region No depletion  Edema (RD Assessment) Moderate  Hair Reviewed  Eyes Reviewed  Mouth Reviewed  Skin Reviewed  Nails Reviewed       Diet Order:   Diet Order             DIET - DYS 1  Fluid consistency: Honey Thick  Diet effective now                   EDUCATION NEEDS:   Not appropriate for education at this time  Skin:  Skin Assessment: Skin Integrity Issues: Skin Integrity Issues:: Stage II, Incisions Stage II: sacrum Incisions: closed rt leg  Last BM:  3/11-type 7  Height:   Ht Readings from Last 1 Encounters:  03/05/23 5\' 9"  (1.753 m)    Weight:   Wt Readings from Last 1 Encounters:  03/13/23 114.6 kg    Ideal Body  Weight:  65.9 kg  BMI:  Body mass index is 37.31 kg/m.  Estimated Nutritional Needs:   Kcal:  1800-2000  Protein:  100-115 grams  Fluid:  1.8-2.0 L    Maceo Pro, MS Dietetic Intern

## 2023-03-14 ENCOUNTER — Inpatient Hospital Stay (HOSPITAL_COMMUNITY)

## 2023-03-14 DIAGNOSIS — S72401A Unspecified fracture of lower end of right femur, initial encounter for closed fracture: Secondary | ICD-10-CM | POA: Diagnosis not present

## 2023-03-14 LAB — GLUCOSE, CAPILLARY
Glucose-Capillary: 105 mg/dL — ABNORMAL HIGH (ref 70–99)
Glucose-Capillary: 110 mg/dL — ABNORMAL HIGH (ref 70–99)
Glucose-Capillary: 106 mg/dL — ABNORMAL HIGH (ref 70–99)
Glucose-Capillary: 101 mg/dL — ABNORMAL HIGH (ref 70–99)
Glucose-Capillary: 142 mg/dL — ABNORMAL HIGH (ref 70–99)

## 2023-03-14 LAB — CBC
HCT: 27.2 % — ABNORMAL LOW (ref 36.0–46.0)
Hemoglobin: 8.2 g/dL — ABNORMAL LOW (ref 12.0–15.0)
MCH: 28.7 pg (ref 26.0–34.0)
MCHC: 30.1 g/dL (ref 30.0–36.0)
MCV: 95.1 fL (ref 80.0–100.0)
Platelets: 312 10*3/uL (ref 150–400)
RBC: 2.86 MIL/uL — ABNORMAL LOW (ref 3.87–5.11)
RDW: 19.6 % — ABNORMAL HIGH (ref 11.5–15.5)
WBC: 13 10*3/uL — ABNORMAL HIGH (ref 4.0–10.5)
nRBC: 3 % — ABNORMAL HIGH (ref 0.0–0.2)

## 2023-03-14 LAB — COMPREHENSIVE METABOLIC PANEL
ALT: 7 U/L (ref 0–44)
AST: 27 U/L (ref 15–41)
Albumin: 2.2 g/dL — ABNORMAL LOW (ref 3.5–5.0)
Alkaline Phosphatase: 41 U/L (ref 38–126)
Anion gap: 8 (ref 5–15)
BUN: 59 mg/dL — ABNORMAL HIGH (ref 8–23)
CO2: 26 mmol/L (ref 22–32)
Calcium: 8.6 mg/dL — ABNORMAL LOW (ref 8.9–10.3)
Chloride: 116 mmol/L — ABNORMAL HIGH (ref 98–111)
Creatinine, Ser: 1.6 mg/dL — ABNORMAL HIGH (ref 0.44–1.00)
GFR, Estimated: 32 mL/min — ABNORMAL LOW (ref >60)
Glucose, Bld: 117 mg/dL — ABNORMAL HIGH (ref 70–99)
Potassium: 4.2 mmol/L (ref 3.5–5.1)
Sodium: 150 mmol/L — ABNORMAL HIGH (ref 135–145)
Total Bilirubin: 0.6 mg/dL (ref 0.0–1.2)
Total Protein: 5.6 g/dL — ABNORMAL LOW (ref 6.5–8.1)

## 2023-03-14 NOTE — Progress Notes (Signed)
 PROGRESS NOTE    Sharon Ramos  FAO:[id number] DOB: 1940-02-16 DOA: 03/04/2023 PCP: Theodis Shove, DO   Brief Narrative:  83 y.o. 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  CT of the knee showed comminuted periprosthetic distal femur fracture, orthopedic surgery was consulted, later did not  recommended surgical intervention.   Assessment & Plan:   Principal Problem:   Closed fracture of right distal femur (HCC) Active Problems:   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)   Anemia of chronic disease   Chronic kidney disease, stage 4 (severe) (HCC)   Generalized anxiety disorder   Mutism   Sensorineural hearing loss (SNHL) of both ears   Severe obesity (HCC)  Closed fracture of right distal femur (HCC) Secondary to mechanical falls. Orthopedic surgery was consulted recommended status post surgical intervention on 03/06/2023 narcotics per BTK surgery. Analgesics and DVT prophylaxis per orthopedic surgery, Eliquis twice a day for 30 days. Continue with analgesia as needed   Acute metabolic encephalopathy: Likely secondary to below sepsis with PNA.  Held Robaxin and melatonin. CT of the head reviewed, neg Mentation now much improved, conversing and following commands   Sepsis secondary to aspiration pneumonia, not present on admission:  On 3/6, p t had increased lethargy, with fevers up to 101.55F as of 3/6 and tachypnea Ordered and reviewed CXR, findings of L>R patchy basilar airspace disease with repeat CXR demonstrating worse changes. SARS-CoV-2 influenza and RSV PCR negative.  Patient improved on azithromycin and Rocephin and she has completed the course as well.   Essential hypertension: Blood pressure controlled, cont on norvasc as  tolerated   New acute kidney injury on chronic kidney disease stage IIIb-patient does not have CKD stage IV: Last creatinine in chart is from December 2022 which was 2.09 with GFR of 24.  However prior to that, creatinine was around 1.5 constantly making it CKD stage IIIb.  Patient's creatinine upon arrival was at baseline but rose and peaked at 3.19 on 03/06/2023.  Creatinine continues to improve and down to 1.6 which is close to her baseline.  Avoid nephrotoxic agents.     Anxiety/delusion/auditory elucidation/Alzheimer's dementia: Continue Lexapro and Namenda.   OAB: Continue oxybutynin.   Morbid obesity: Weight loss and diet modification counseled.   Hypernatremia Sodium remains stable around 150.  Sacral decubitus ulcer present on admission stage II: RN Pressure Injury Documentation:        Pressure Injury 03/04/23 Other (Comment) Medial Stage 2 -  Partial thickness loss of dermis presenting as a shallow open injury with a red, pink wound bed without slough. (Active)  03/04/23 2000  Location: Other (Comment)  Location Orientation: Medial  Staging: Stage 2 -  Partial thickness loss of dermis presenting as a shallow open injury with a red, pink wound bed without slough.  Wound Description (Comments):   Present on Admission: Yes  Dressing Type Foam - Lift dressing to assess site every shift 03/06/23 1930    Acute blood loss anemia -Likely secondary to recent surgery -improved after 1 unit PRBC transfusion.  Currently stable around 8.   FEN/dysphagia: -Patient maintained on tube feeds, SLP following, started on dysphagia 1 diet 03/13/2023.  Slow weaning from tube feeds per dietitian and SLP.  DVT prophylaxis: enoxaparin (LOVENOX) injection 40 mg Start: 03/10/23 1600 SCDs Start: 03/05/23  1647 SCDs Start: 03/04/23 1751   Code Status: Full Code  Family Communication: Daughter present at bedside.   Status is: Inpatient: still tube feed dependent   Estimated body mass index is  36.46 kg/m as calculated from the following:   Height as of this encounter: 5\' 9"  (1.753 m).   Weight as of this encounter: 112 kg.  Pressure Injury 03/04/23 Other (Comment) Medial Stage 2 -  Partial thickness loss of dermis presenting as a shallow open injury with a red, pink wound bed without slough. (Active)  03/04/23 2000  Location: Other (Comment) (intergluteal cleft)  Location Orientation: Medial  Staging: Stage 2 -  Partial thickness loss of dermis presenting as a shallow open injury with a red, pink wound bed without slough.  Wound Description (Comments):   Present on Admission: Yes  Dressing Type Foam - Lift dressing to assess site every shift 03/13/23 1010   Nutritional Assessment: Body mass index is 36.46 kg/m.Marland Kitchen Seen by dietician.  I agree with the assessment and plan as outlined below: Nutrition Status: Nutrition Problem: Inadequate oral intake Etiology: inability to eat, dysphagia Signs/Symptoms: NPO status Interventions: Tube feeding  . Skin Assessment: I have examined the patient's skin and I agree with the wound assessment as performed by the wound care RN as outlined below: Pressure Injury 03/04/23 Other (Comment) Medial Stage 2 -  Partial thickness loss of dermis presenting as a shallow open injury with a red, pink wound bed without slough. (Active)  03/04/23 2000  Location: Other (Comment) (intergluteal cleft)  Location Orientation: Medial  Staging: Stage 2 -  Partial thickness loss of dermis presenting as a shallow open injury with a red, pink wound bed without slough.  Wound Description (Comments):   Present on Admission: Yes  Dressing Type Foam - Lift dressing to assess site every shift 03/13/23 1010    Consultants:  Orthopedics  Procedures:  As above  Antimicrobials:  Anti-infectives (From admission, onward)    Start     Dose/Rate Route Frequency Ordered Stop   03/12/23 1215  azithromycin (ZITHROMAX) 500 mg in sodium chloride 0.9 % 250 mL IVPB   Status:  Discontinued        500 mg 250 mL/hr over 60 Minutes Intravenous  Once 03/12/23 1115 03/12/23 1116   03/08/23 1400  cefTRIAXone (ROCEPHIN) 2 g in sodium chloride 0.9 % 100 mL IVPB        2 g 200 mL/hr over 30 Minutes Intravenous Daily 03/08/23 1333 03/12/23 1100   03/08/23 1400  azithromycin (ZITHROMAX) 500 mg in sodium chloride 0.9 % 250 mL IVPB        500 mg 250 mL/hr over 60 Minutes Intravenous Daily 03/08/23 1333 03/12/23 1400   03/05/23 2100  ceFAZolin (ANCEF) IVPB 2g/100 mL premix        2 g 200 mL/hr over 30 Minutes Intravenous Every 8 hours 03/05/23 1646 03/06/23 1552   03/05/23 1317  vancomycin (VANCOCIN) powder  Status:  Discontinued          As needed 03/05/23 1317 03/05/23 1426   03/05/23 1030  ceFAZolin (ANCEF) IVPB 2g/100 mL premix        2 g 200 mL/hr over 30 Minutes Intravenous On call to O.R. 03/05/23 0937 03/05/23 1310         Subjective: Patient seen and examined, patient deaf, daughter at the bedside who is helping with communication.  Patient is feeling well with no complaints at all.  Objective: Vitals:   03/13/23 2359 03/14/23  0420 03/14/23 0500 03/14/23 0747  BP: (!) 125/53 (!) 135/52  (!) 139/58  Pulse: 63 62    Resp: 18 16  18   Temp: 98.3 F (36.8 C) 97.8 F (36.6 C)  97.8 F (36.6 C)  TempSrc: Oral Oral  Oral  SpO2: 100% 100%    Weight:   112 kg   Height:        Intake/Output Summary (Last 24 hours) at 03/14/2023 0800 Last data filed at 03/13/2023 2045 Gross per 24 hour  Intake 520 ml  Output --  Net 520 ml   Filed Weights   03/12/23 0435 03/13/23 0450 03/14/23 0500  Weight: 114.4 kg 114.6 kg 112 kg    Examination:  General exam: Appears calm and comfortable  Respiratory system: Clear to auscultation. Respiratory effort normal. Cardiovascular system: S1 & S2 heard, RRR. No JVD, murmurs, rubs, gallops or clicks. No pedal edema. Gastrointestinal system: Abdomen is nondistended, soft and nontender. No organomegaly or masses  felt. Normal bowel sounds heard. Central nervous system: Alert and likely oriented.  No focal deficit. Extremities: Symmetric 5 x 5 power. Skin: No rashes, lesions or ulcers  Data Reviewed: I have personally reviewed following labs and imaging studies  CBC: Recent Labs  Lab 03/10/23 1316 03/11/23 0513 03/12/23 0528 03/13/23 0436 03/14/23 0408  WBC 9.0 9.2 10.3 11.2* 13.0*  HGB 7.9* 7.5* 7.9* 8.1* 8.2*  HCT 25.2* 24.2* 26.1* 27.2* 27.2*  MCV 92.0 92.4 93.9 96.5 95.1  PLT 211 228 275 334 312   Basic Metabolic Panel: Recent Labs  Lab 03/09/23 1927 03/10/23 0454 03/10/23 1316 03/10/23 1639 03/11/23 0513 03/12/23 0528 03/13/23 0436 03/14/23 0408  NA  --   --  147*  --  148* 151* 151* 150*  K  --   --  4.0  --  3.8 4.0 3.9 4.2  CL  --   --  114*  --  119* 121* 118* 116*  CO2  --   --  21*  --  22 22 22 26   GLUCOSE  --   --  136*  --  146* 108* 114* 117*  BUN  --   --  52*  --  51* 51* 53* 59*  CREATININE  --   --  1.78*  --  1.59* 1.50* 1.48* 1.60*  CALCIUM  --   --  7.8*  --  7.7* 8.1* 8.4* 8.6*  MG 2.3 2.2  --  2.3 2.2  --   --   --   PHOS 3.3 2.7  --  2.2* 2.0*  --   --   --    GFR: Estimated Creatinine Clearance: 36.2 mL/min (A) (by C-G formula based on SCr of 1.6 mg/dL (H)). Liver Function Tests: Recent Labs  Lab 03/10/23 1316 03/11/23 0513 03/12/23 0528 03/13/23 0436 03/14/23 0408  AST 42* 35 30 27 27   ALT <5 <5 <5 <5 7  ALKPHOS 35* 35* 34* 43 41  BILITOT 0.5 0.7 0.6 0.6 0.6  PROT 5.2* 5.0* 5.4* 5.4* 5.6*  ALBUMIN 2.0* 2.0* 2.2* 2.1* 2.2*   No results for input(s): "LIPASE", "AMYLASE" in the last 168 hours. No results for input(s): "AMMONIA" in the last 168 hours. Coagulation Profile: No results for input(s): "INR", "PROTIME" in the last 168 hours. Cardiac Enzymes: No results for input(s): "CKTOTAL", "CKMB", "CKMBINDEX", "TROPONINI" in the last 168 hours. BNP (last 3 results) No results for input(s): "PROBNP" in the last 8760 hours. HbA1C: No  results for input(s): "HGBA1C" in the  last 72 hours. CBG: Recent Labs  Lab 03/13/23 1708 03/13/23 2007 03/13/23 2358 03/14/23 0413 03/14/23 0758  GLUCAP 106* 121* 119* 105* 110*   Lipid Profile: No results for input(s): "CHOL", "HDL", "LDLCALC", "TRIG", "CHOLHDL", "LDLDIRECT" in the last 72 hours. Thyroid Function Tests: No results for input(s): "TSH", "T4TOTAL", "FREET4", "T3FREE", "THYROIDAB" in the last 72 hours. Anemia Panel: No results for input(s): "VITAMINB12", "FOLATE", "FERRITIN", "TIBC", "IRON", "RETICCTPCT" in the last 72 hours. Sepsis Labs: Recent Labs  Lab 03/08/23 1409 03/08/23 1556  LATICACIDVEN 1.1 1.0    Recent Results (from the past 240 hours)  Resp panel by RT-PCR (RSV, Flu A&B, Covid) Anterior Nasal Swab     Status: None   Collection Time: 03/04/23  4:07 PM   Specimen: Anterior Nasal Swab  Result Value Ref Range Status   SARS Coronavirus 2 by RT PCR NEGATIVE NEGATIVE Final   Influenza A by PCR NEGATIVE NEGATIVE Final   Influenza B by PCR NEGATIVE NEGATIVE Final    Comment: (NOTE) The Xpert Xpress SARS-CoV-2/FLU/RSV plus assay is intended as an aid in the diagnosis of influenza from Nasopharyngeal swab specimens and should not be used as a sole basis for treatment. Nasal washings and aspirates are unacceptable for Xpert Xpress SARS-CoV-2/FLU/RSV testing.  Fact Sheet for Patients: BloggerCourse.com  Fact Sheet for Healthcare Providers: SeriousBroker.it  This test is not yet approved or cleared by the [place] FDA and has been authorized for detection and/or diagnosis of SARS-CoV-2 by FDA under an Emergency Use Authorization (EUA). This EUA will remain in effect (meaning this test can be used) for the duration of the COVID-19 declaration under Section 564(b)(1) of the Act, 21 U.S.C. section 360bbb-3(b)(1), unless the authorization is terminated or revoked.     Resp Syncytial Virus by PCR  NEGATIVE NEGATIVE Final    Comment: (NOTE) Fact Sheet for Patients: BloggerCourse.com  Fact Sheet for Healthcare Providers: SeriousBroker.it  This test is not yet approved or cleared by the [place] FDA and has been authorized for detection and/or diagnosis of SARS-CoV-2 by FDA under an Emergency Use Authorization (EUA). This EUA will remain in effect (meaning this test can be used) for the duration of the COVID-19 declaration under Section 564(b)(1) of the Act, 21 U.S.C. section 360bbb-3(b)(1), unless the authorization is terminated or revoked.  Performed at [facility], [address], [place], [place] [address]   Surgical pcr screen     Status: None   Collection Time: 03/05/23  5:49 AM   Specimen: Nasal Mucosa; Nasal Swab  Result Value Ref Range Status   MRSA, PCR NEGATIVE NEGATIVE Final   Staphylococcus aureus NEGATIVE NEGATIVE Final    Comment: (NOTE) The Xpert SA Assay (FDA approved for NASAL specimens in patients 28 years of age and older), is one component of a comprehensive surveillance program. It is not intended to diagnose infection nor to guide or monitor treatment. Performed at [facility], [address], [place], [place] [address]   Culture, blood (Routine X 2) w Reflex to ID Panel     Status: None   Collection Time: 03/08/23  2:09 PM   Specimen: BLOOD RIGHT ARM  Result Value Ref Range Status   Specimen Description BLOOD RIGHT ARM  Final   Special Requests   Final    BOTTLES DRAWN AEROBIC AND ANAEROBIC Blood Culture results may not be optimal due to an inadequate volume of blood received in culture bottles   Culture   Final  NO GROWTH 5 DAYS Performed at [facility], [address] [place], [place] [address]    Report Status 03/13/2023 FINAL  Final  Culture, blood (Routine X 2) w Reflex to ID Panel     Status: None   Collection Time: 03/08/23  2:09 PM   Specimen: BLOOD RIGHT  ARM  Result Value Ref Range Status   Specimen Description BLOOD RIGHT ARM  Final   Special Requests   Final    BOTTLES DRAWN AEROBIC AND ANAEROBIC Blood Culture results may not be optimal due to an inadequate volume of blood received in culture bottles   Culture   Final    NO GROWTH 5 DAYS Performed at [facility], [address], [place], [place] [address]    Report Status 03/13/2023 FINAL  Final     Radiology Studies: No results found.  Scheduled Meds:  acetaminophen  650 mg Per Tube Q6H   amLODipine  5 mg Per Tube Daily   ascorbic acid  500 mg Per Tube BID   Chlorhexidine Gluconate Cloth  6 each Topical Daily   dorzolamide-timolol  1 drop Both Eyes BID   enoxaparin (LOVENOX) injection  40 mg Subcutaneous Q24H   escitalopram  10 mg Per Tube Daily   feeding supplement (PROSource TF20)  60 mL Per Tube BID   fiber supplement (BANATROL TF)  60 mL Per Tube BID   free water  200 mL Per Tube Q2H   Gerhardt's butt cream   Topical BID   memantine  10 mg Per Tube BID   multivitamin with minerals  1 tablet Per Tube Daily   oxybutynin  5 mg Per Tube TID   pneumococcal 20-valent conjugate vaccine  0.5 mL Intramuscular Tomorrow-1000   thiamine  100 mg Per Tube Daily   zinc sulfate (50mg  elemental zinc)  220 mg Per Tube Daily   Continuous Infusions:  feeding supplement (OSMOLITE 1.5 CAL) 1,000 mL (03/14/23 0604)     LOS: 10 days   Hughie Closs, MD [facility]  03/14/2023, 8:00 AM   *Please note that this is a verbal dictation therefore any spelling or grammatical errors are due to the "Dragon Medical One" system interpretation.  Please page via Amion and do not message via secure chat for urgent patient care matters. Secure chat can be used for non urgent patient care matters.  How to contact the [facility] Attending or Consulting provider 7A - 7P or covering provider during after hours 7P -7A, for this patient?  Check the care team in [facility] and look for a)  attending/consulting [facility] provider listed and b) the [facility] team listed. Page or secure chat 7A-7P. Log into [email or web address] and use [facility] universal password to access. If you do not have the password, please contact the hospital operator. Locate the [facility] provider you are looking for under [facility] and page to a number that you can be directly reached. If you still have difficulty reaching the provider, please page the [facility] (Director on Call) for the Hospitalists listed on amion for assistance.

## 2023-03-14 NOTE — Progress Notes (Signed)
 Pt having increased work of breathing, moaning (previously signed that she was in pain), rhonchi in right/left upper lobes; PRN neb treatment administered, 6L [place] O2, O2 saturations now @ 94%. Attempted to notify Loney Loh, MD. Awaiting further orders.  Bari Edward, RN

## 2023-03-14 NOTE — Progress Notes (Signed)
 MBS report will be posted tomorrow. Pt to continue current diet, Dys 1/honey. Discussed briefly with daughter.  Harlon Ditty, MA CCC-SLP  Acute Rehabilitation Services Secure Chat Preferred Office [phone]

## 2023-03-14 NOTE — Progress Notes (Signed)
 Speech Language Pathology Treatment: Dysphagia  Patient Details Name: Sharon Ramos MRN: [id number] DOB: 03/23/1940 Today's Date: 03/14/2023 Time: 1000-1020 SLP Time Calculation (min) (ACUTE ONLY): 20 min  Assessment / Plan / Recommendation Clinical Impression  Pt more alert, subtle signs of aspiration still present. Pt has been dissatisfied with honey thick liquids and is constantly asking for water. Will proceed with MBS for instrumental assessment of swallowing.   HPI        SLP Plan  MBS;New goals to be determined pending instrumental study      Recommendations for follow up therapy are one component of a multi-disciplinary discharge planning process, led by the attending physician.  Recommendations may be updated based on patient status, additional functional criteria and insurance authorization.    Recommendations  Diet recommendations: Honey-thick liquid;Dysphagia 1 (puree) Liquids provided via: Teaspoon Medication Administration: Via alternative means Supervision: Patient able to self feed;Full supervision/cueing for compensatory strategies Compensations: Slow rate Postural Changes and/or Swallow Maneuvers: Seated upright 90 degrees                              MBS;New goals to be determined pending instrumental study     Benjamin Merrihew, Riley Nearing  03/14/2023, 10:33 AM

## 2023-03-14 NOTE — Progress Notes (Signed)
 Physical Therapy Treatment Patient Details Name: Sharon Ramos MRN: [id number] DOB: [birth year] Today's Date: 03/14/2023   History of Present Illness Patient is a 83 year old female with  ground-level fall with a right supracondylar periprosthetic distal femur fracture. S/p ORIF.  History of hypertension, CKD 4, anxiety, hallucination, Alzheimer's dementia, anemia, deaf, mute, OAB, and obesity.    PT Comments  Pt in bed incontinent of stool. PT and Mobility Specialist assisted NT in cleaning pt. Pt requires total Ax2-3 for rolling in the bed for pericare. Once cleaned pt requires total Ax2 for coming to seated EoB. Pt initially requires assist of 2 to maintain static balance, with time pt able to grab foot board and bed rail to maintain balance. After ~3 min pt is able to maintain her balance without assistance. While sitting on EoB, worked with pt on forward leaning in preparation to lift hips from bed to come to standing. ASL interpreter present throughout providing interpretation and increased encouragement. Pt grateful to get up to EoB but fatigues with exercise and requires total Ax2 for getting back to bed. D/c plan of home with maximal equipment and 24/7 assist remains appropriate. PT will continue to follow acutely to progress mobility until discharge.     If plan is discharge home, recommend the following: Two people to help with walking and/or transfers;Two people to help with bathing/dressing/bathroom;Assistance with cooking/housework;Assistance with feeding;Direct supervision/assist for medications management;Direct supervision/assist for financial management;Assist for transportation;Help with stairs or ramp for entrance;Supervision due to cognitive status   Can travel by private vehicle     No  Equipment Recommendations  Hoyer lift;Hospital bed;Wheelchair (measurements PT);Wheelchair cushion (measurements PT);BSC/3in1    Recommendations for Other Services       Precautions /  Restrictions Precautions Precautions: Fall Recall of Precautions/Restrictions: Impaired Restrictions Weight Bearing Restrictions Per Provider Order: Yes RLE Weight Bearing Per Provider Order: Weight bearing as tolerated     Mobility  Bed Mobility Overal bed mobility: Needs Assistance Bed Mobility: Supine to Sit, Sit to Supine, Rolling Rolling: Total assist, +2 for physical assistance   Supine to sit: Total assist, HOB elevated Sit to supine: Total assist   General bed mobility comments: total Ax2 for rolling for pericare, management of LE off bed and trunk to upright and return of LE to bed, and trunk to supine    Transfers                   General transfer comment: unable/unsafe to attempt at this time                     Balance Overall balance assessment: Needs assistance Sitting-balance support: Feet supported Sitting balance-Leahy Scale: Poor Sitting balance - Comments: initially requiring total A for maintaining balance on EoB, with assist for hand placement on footboard and and hand rail from elevated HoB pt able to achieve balance                                    Communication Communication Communication: Impaired Factors Affecting Communication: Hearing impaired;Difficulty expressing self (difficulty with signing with ASL (likely due to impaired cognition as well as weakness))  Cognition Arousal: Alert Behavior During Therapy: Flat affect   PT - Cognitive impairments: Difficult to assess Difficult to assess due to: Hard of hearing/deaf  PT - Cognition Comments: Daughter Olegario Messier, and in-house ASL interpreter aided with translation Following commands: Impaired Following commands impaired: Follows one step commands inconsistently    Cueing Cueing Techniques: Verbal cues, Gestural cues, Tactile cues, Visual cues  Exercises Other Exercises Other Exercises: seated forward leans with hands rubbing down  shins x5 Other Exercises: seated forward leans with hands rubbing down shins and chest and head up x5    General Comments General comments (skin integrity, edema, etc.): daughter Olegario Messier in room,      Pertinent Vitals/Pain Pain Assessment Pain Assessment: Faces Pain Location: B LE with rolling for pericare Pain Descriptors / Indicators: Grimacing, Guarding, Moaning Pain Intervention(s): Limited activity within patient's tolerance, Monitored during session, Repositioned     PT Goals (current goals can now be found in the care plan section) Acute Rehab PT Goals Patient Stated Goal: patient unable to participate with goal setting PT Goal Formulation: Patient unable to participate in goal setting Time For Goal Achievement: 03/20/23 Potential to Achieve Goals: Fair Progress towards PT goals: Progressing toward goals    Frequency    Min 2X/week       AM-PAC PT "6 Clicks" Mobility   Outcome Measure  Help needed turning from your back to your side while in a flat bed without using bedrails?: Total Help needed moving from lying on your back to sitting on the side of a flat bed without using bedrails?: Total Help needed moving to and from a bed to a chair (including a wheelchair)?: Total Help needed standing up from a chair using your arms (e.g., wheelchair or bedside chair)?: Total Help needed to walk in hospital room?: Total Help needed climbing 3-5 steps with a railing? : Total 6 Click Score: 6    End of Session Equipment Utilized During Treatment: Oxygen Activity Tolerance: Patient limited by fatigue;Patient limited by lethargy Patient left: in bed;with call bell/phone within reach;with bed alarm set;with nursing/sitter in room Nurse Communication: Mobility status PT Visit Diagnosis: Muscle weakness (generalized) (M62.81);Unsteadiness on feet (R26.81)     Time: 1610-9604 PT Time Calculation (min) (ACUTE ONLY): 54 min  Charges:    $Therapeutic Exercise: 8-22  mins $Therapeutic Activity: 38-52 mins PT General Charges $$ ACUTE PT VISIT: 1 Visit                     Analyssa Downs B. Beverely Risen PT, DPT Acute Rehabilitation Services Please use secure chat or  Call Office [phone]    Elon Alas [facility] 03/14/2023, 5:17 PM

## 2023-03-15 ENCOUNTER — Inpatient Hospital Stay (HOSPITAL_COMMUNITY)

## 2023-03-15 DIAGNOSIS — I509 Heart failure, unspecified: Secondary | ICD-10-CM

## 2023-03-15 DIAGNOSIS — S72401A Unspecified fracture of lower end of right femur, initial encounter for closed fracture: Secondary | ICD-10-CM | POA: Diagnosis not present

## 2023-03-15 LAB — GLUCOSE, CAPILLARY
Glucose-Capillary: 102 mg/dL — ABNORMAL HIGH (ref 70–99)
Glucose-Capillary: 105 mg/dL — ABNORMAL HIGH (ref 70–99)
Glucose-Capillary: 110 mg/dL — ABNORMAL HIGH (ref 70–99)
Glucose-Capillary: 110 mg/dL — ABNORMAL HIGH (ref 70–99)
Glucose-Capillary: 122 mg/dL — ABNORMAL HIGH (ref 70–99)

## 2023-03-15 LAB — ECHOCARDIOGRAM COMPLETE
AR max vel: 2.47 cm2
AV Area VTI: 2.17 cm2
AV Area mean vel: 2.1 cm2
AV Mean grad: 7 mmHg
AV Peak grad: 10.2 mmHg
Ao pk vel: 1.6 m/s
Area-P 1/2: 3.2 cm2
Calc EF: 65 %
Height: 69 in
MV VTI: 2.31 cm2
S' Lateral: 3.2 cm
Single Plane A2C EF: 67.2 %
Single Plane A4C EF: 62.4 %
Weight: 3950.64 [oz_av]

## 2023-03-15 LAB — BRAIN NATRIURETIC PEPTIDE: B Natriuretic Peptide: 169.7 pg/mL — ABNORMAL HIGH (ref 0.0–100.0)

## 2023-03-15 LAB — BASIC METABOLIC PANEL
Anion gap: 9 (ref 5–15)
BUN: 62 mg/dL — ABNORMAL HIGH (ref 8–23)
CO2: 24 mmol/L (ref 22–32)
Calcium: 8.2 mg/dL — ABNORMAL LOW (ref 8.9–10.3)
Chloride: 107 mmol/L (ref 98–111)
Creatinine, Ser: 1.66 mg/dL — ABNORMAL HIGH (ref 0.44–1.00)
GFR, Estimated: 31 mL/min — ABNORMAL LOW (ref >60)
Glucose, Bld: 113 mg/dL — ABNORMAL HIGH (ref 70–99)
Potassium: 4.2 mmol/L (ref 3.5–5.1)
Sodium: 140 mmol/L (ref 135–145)

## 2023-03-15 MED ORDER — FUROSEMIDE 10 MG/ML IJ SOLN
40.0000 mg | Freq: Two times a day (BID) | INTRAMUSCULAR | Status: DC
  Administered 2023-03-15 – 2023-03-17 (×6): 40 mg via INTRAVENOUS
  Filled 2023-03-15 (×6): qty 4

## 2023-03-15 NOTE — Progress Notes (Addendum)
   03/14/23 1400  SLP Visit Information  SLP Received On 03/14/23  General Information  HPI Sharon Ramos is an 83 y.o. 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  CT of the knee showed comminuted periprosthetic distal femur fracture, orthopedic surgery completed on 3/4.  Diet Prior to this Study Dysphagia 1 (pureed);Moderately thick liquids (Level 3, honey thick)  Temperature  Normal  Respiratory Status WFL  Supplemental O2 Nasal cannula  History of Recent Intubation No  Behavior/Cognition Alert;Cooperative  Self-Feeding Abilities Needs assist with self-feeding  Baseline vocal quality/speech Not observed  Volitional Cough  (weak congested)  Orofacial Exam  Oral Cavity - Dentition Poor condition  Boluses Administered  Boluses Administered Thin liquids (Level 0);Mildly thick liquids (Level 2, nectar thick);Moderately thick liquids (Level 3, honey thick);Puree  Oral Impairment Domain  Lip Closure Escape beyond mid-chin  Tongue control during bolus hold Not tested  Bolus transport/lingual motion Delayed initiation of tongue motion (oral holding)  Oral residue Residue collection on oral structures  Location of oral residue  Tongue;Lateral sulci;Floor of mouth  Initiation of pharyngeal swallow  Pyriform sinuses  Pharyngeal Impairment Domain  Pharyngeal residue Collection of residue within or on pharyngeal structures  Location of pharyngeal residue Pyriform sinuses;Tongue base  Clinical Impression  Clinical Impression Pt alert, but participation limited due to weakness and pt poorly attentive to ASL interpreter.  Pt unable to follow most commands to cough or swallow.  Pt found to have thick pooled oral secretions, tinted brown from history of tobacco chewing. Pt  left leaning throught test, difficult to reposition. Dysphagia characterized as severe (DIGEST score 3). There was prolonged oral holding, tremulous lingual tension and moderate oral residue. Oral transit somewhat hesitant with timely laryngeal closure, but delayed and effortful swallow intiation. Pt consistently penetrated nectar and thin liquids before the swallow and aspirated during and after due to residue and oropharyngeal secretions.  Sensation intermittent, cough insufficient. Pt additionally had moderate residue at the PES though this was hard to visualize with pts position.   Pt is at risk of aspiration with all textures. There was most significant aspiration and coughing with thin liquids. Decreasing bolus size to teaspoon with honey improved timing and control. Pt also tolerated puree well, but suspect oropharyngeal residue and secretions will be aspirated regardless of modified diet. Daughter and pt may want to consider allowing unrestricted diet with known risk given that thickening may not significantly improve outcomes in this case given pt difficulty in following abstract swallowing commands even with in person interpreter.   Brief discussion with daughter after exam; she verbalized desire to continue Dys 1/honey thick liquids in order to reduce risk. Not ready to consider comfort feeding at this time. SLP encouraged oral care to decrease bacterial load as the most important objective.  SLP Visit Diagnosis Dysphagia, oropharyngeal phase (R13.12)  SLP Time Calculation  SLP Start Time (ACUTE ONLY) 1200  SLP Stop Time (ACUTE ONLY) 1235  SLP Time Calculation (min) (ACUTE ONLY) 35 min  SLP Evaluations  $ SLP Speech Visit 1 Visit  SLP Evaluations  $MBS Swallow 1 Procedure  $Swallowing Treatment 1 Procedure

## 2023-03-15 NOTE — Progress Notes (Signed)
 Nutrition Follow-up  DOCUMENTATION CODES:   Obesity unspecified  INTERVENTION:  Attached "IDDSI level 4 pureed nutrition therapy" handout to discharge summary per pt's daughter request  Continue TF via Cortrak: Osmolite 1.5 @ 50 ml/hr 60 ml Prosource TF BID 150 ml free water flush every 4 hours Tube feeding regimen provides 1960 kcal (100% of needs), 115 grams of protein, and 914 ml of H2O. Total free water: 1814 ml daily    Banatrol BID  Monitor po intake on dysphagia 1 diet; advance per SLP recommendations Magic cup TID with meals, each supplement provides 290 kcal and 9 grams of protein MVI with minerals daily Continue vitamin C and zinc as ordered Continue 100 mg thiamine daily as ordered    NUTRITION DIAGNOSIS:   Inadequate oral intake related to inability to eat, dysphagia as evidenced by NPO status.  Still applicable  GOAL:   Patient will meet greater than or equal to 90% of their needs  Met through TF  MONITOR:   Diet advancement, TF tolerance  REASON FOR ASSESSMENT:   Consult Assessment of nutrition requirement/status  ASSESSMENT:   Pt with medical history significant of hypertension, CKD 4, anxiety, hallucination, delusion, Alzheimer's dementia, anemia, deaf, mute, OAB, obesity presenting with multiple falls and knee pain.  3/3- s/p Open reduction internal fixation of right supracondylar distal femur fracture  3/6- s/p BSE- recommend NPO with nutrition via alternative means 3/7: cortrak placed 3/11: diet advanced to dysphagia 1 with honey thick liquids 3/12: MBS. Dysphagia is severe. Continue current diet per SLP  Pt's daughter at bedside to help provide nutrition information from last few days.Daughter reports that pt is pleased to be on a diet and is eating well, however, has showed some disinterest in the specific foods available on dys 1 diet. She reports pt loves the magic cup and has been eating 100% of them. She also reports that pt has been  frequently requesting water but is given thickened juices like cranberry and apple and she has liked those.  Daughter reports that MD said she will be on this diet after discharge from hospital. Attached pureed nutrition therapy handout to discharge summary as discussed with daughter.  Meds: vitamin c, zinc, colace, prosource BID per tube, MVI, thiamine, osmolite 1.5 50 ml/hr, reglan PRN, miralax PRN, banatrol BID  Labs: CBGs range from 105-142, BUN 62, Creatinine 1.66    Diet Order:   Diet Order             DIET - DYS 1 Fluid consistency: Honey Thick  Diet effective now                   EDUCATION NEEDS:   Not appropriate for education at this time  Skin:  Skin Assessment: Skin Integrity Issues: Skin Integrity Issues:: Stage II, Incisions Stage II: sacrum Incisions: closed rt leg  Last BM:  3/13-type 7  Height:   Ht Readings from Last 1 Encounters:  03/05/23 5\' 9"  (1.753 m)    Weight:   Wt Readings from Last 1 Encounters:  03/14/23 112 kg    Ideal Body Weight:  65.9 kg  BMI:  Body mass index is 36.46 kg/m.  Estimated Nutritional Needs:   Kcal:  1800-2000  Protein:  100-115 grams  Fluid:  1.8-2.0 L    Maceo Pro, MS Dietetic Intern

## 2023-03-15 NOTE — Progress Notes (Signed)
 Speech Language Pathology Treatment: Dysphagia  Patient Details Name: Sharon Ramos MRN: [id number] DOB: 11/23/1940 Today's Date: 03/15/2023 Time: 1345-1400 SLP Time Calculation (min) (ACUTE ONLY): 15 min  Assessment / Plan / Recommendation Clinical Impression  Pt demonstrates fatigue and disinterest in food and drink. SLP completed oral care and retrieved thick brown secretions from her mouth. Pt accepted a few teaspoons of thick tea. SLP and daughter talked about MBS result, showed images and explained diet and rationale for oral care. Pt would benefit from QID oral care for a few days.until secretion viscosity improves. Will f/u.   HPI HPI: Sharon Ramos is an 83 y.o. 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  CT of the knee showed comminuted periprosthetic distal femur fracture, orthopedic surgery completed on 3/4.      SLP Plan  Continue with current plan of care      Recommendations for follow up therapy are one component of a multi-disciplinary discharge planning process, led by the attending physician.  Recommendations may be updated based on patient status, additional functional criteria and insurance authorization.    Recommendations  Diet recommendations: Honey-thick liquid;Dysphagia 1 (puree) Liquids provided via: Teaspoon Medication Administration: Via alternative means Supervision: Patient able to self feed;Full supervision/cueing for compensatory strategies Compensations: Slow rate                        Dysphagia, oropharyngeal phase (R13.12)     Continue with current plan of care     Sharon Ramos, Riley Nearing  03/15/2023, 2:36 PM

## 2023-03-15 NOTE — Progress Notes (Signed)
 Occupational Therapy Treatment Patient Details Name: Sharon Ramos MRN: [id number] DOB: [birth year] Today's Date: 03/15/2023   History of present illness Patient is a 83 year old female with  ground-level fall with a right supracondylar periprosthetic distal femur fracture. S/p ORIF.  History of hypertension, CKD 4, anxiety, hallucination, Alzheimer's dementia, anemia, deaf, mute, OAB, and obesity.   OT comments  Attempted bedlevel HEP and rolling side to side.  Patient found soaked in urine, assisted with peri care and rolling side to side.  Patient with very little effort and essentially Total A for bed mobility.  Positioned with +2 for lunch.  OT can continue efforts in the acute setting to address deficits, and Patient will benefit from continued inpatient follow up therapy, <3 hours/day.  Daughter is hopefully she can begin to stand, then she could consider home with [facility].      If plan is discharge home, recommend the following:  Two people to help with walking and/or transfers;Two people to help with bathing/dressing/bathroom;Assistance with cooking/housework;Assistance with feeding;Direct supervision/assist for medications management;Direct supervision/assist for financial management;Assist for transportation;Supervision due to cognitive status   Equipment Recommendations  Other (comment)    Recommendations for Other Services      Precautions / Restrictions Precautions Precautions: Fall Recall of Precautions/Restrictions: Impaired Precaution/Restrictions Comments: pt is deaf, daughter uses sign language to communicate with her Restrictions Weight Bearing Restrictions Per Provider Order: Yes RLE Weight Bearing Per Provider Order: Weight bearing as tolerated       Mobility Bed Mobility Overal bed mobility: Needs Assistance Bed Mobility: Rolling Rolling: Total assist, +2 for physical assistance              Transfers                   General transfer  comment: unable/unsafe due to inability to put forth effort     Balance                                           ADL either performed or assessed with clinical judgement   ADL                                              Extremity/Trunk Assessment Upper Extremity Assessment Upper Extremity Assessment: Generalized weakness   Lower Extremity Assessment Lower Extremity Assessment: Defer to PT evaluation   Cervical / Trunk Assessment Cervical / Trunk Assessment: Other exceptions Cervical / Trunk Exceptions: BMI    Vision Patient Visual Report: No change from baseline     Perception Perception Perception: Not tested   Praxis Praxis Praxis: Not tested   Communication Communication Communication: Impaired Factors Affecting Communication: Hearing impaired;Difficulty expressing self   Cognition Arousal: Alert Behavior During Therapy: Flat affect Cognition: History of cognitive impairments             OT - Cognition Comments: Daughter states mild dementia                 Following commands: Impaired        Cueing   Cueing Techniques: Gestural cues, Tactile cues  Exercises General Exercises - Upper Extremity Shoulder Flexion: AAROM, Both Shoulder Extension: AROM, Both Elbow Flexion: AAROM Elbow Extension: AAROM Wrist Flexion: AAROM Wrist Extension: AAROM Digit Composite  Flexion: AAROM Composite Extension: AAROM General Exercises - Lower Extremity Heel Slides: PROM, AAROM, Both, Supine    Shoulder Instructions       General Comments      Pertinent Vitals/ Pain       Pain Assessment Pain Assessment: Faces Faces Pain Scale: Hurts little more Pain Location: B LE with rolling for pericare Pain Descriptors / Indicators: Grimacing, Guarding Pain Intervention(s): Monitored during session                                                          Frequency  Min 1X/week         Progress Toward Goals  OT Goals(current goals can now be found in the care plan section)     Acute Rehab OT Goals OT Goal Formulation: Patient unable to participate in goal setting Time For Goal Achievement: 03/20/23 Potential to Achieve Goals: Fair  Plan      Co-evaluation                 AM-PAC OT "6 Clicks" Daily Activity     Outcome Measure   Help from another person eating meals?: A Lot Help from another person taking care of personal grooming?: A Lot Help from another person toileting, which includes using toliet, bedpan, or urinal?: Total Help from another person bathing (including washing, rinsing, drying)?: Total Help from another person to put on and taking off regular upper body clothing?: A Lot Help from another person to put on and taking off regular lower body clothing?: Total 6 Click Score: 9    End of Session Equipment Utilized During Treatment: Oxygen  OT Visit Diagnosis: Muscle weakness (generalized) (M62.81);History of falling (Z91.81);Feeding difficulties (R63.3);Other symptoms and signs involving cognitive function   Activity Tolerance Patient limited by lethargy   Patient Left in bed;with call bell/phone within reach;with family/visitor present   Nurse Communication Mobility status        Time: 1337-1400 OT Time Calculation (min): 23 min  Charges: OT General Charges $OT Visit: 1 Visit OT Treatments $Self Care/Home Management : 8-22 mins $Therapeutic Activity: 8-22 mins  03/15/2023  RP, OTR/L  Acute Rehabilitation Services  Office:  [phone]   Suzanna Obey 03/15/2023, 2:15 PM

## 2023-03-15 NOTE — Progress Notes (Signed)
 PROGRESS NOTE    Sharon Ramos  WUJ:[id number] DOB: Nov 22, 1940 DOA: 03/04/2023 PCP: Theodis Shove, DO   Brief Narrative:  83 y.o. 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  CT of the knee showed comminuted periprosthetic distal femur fracture, orthopedic surgery was consulted, later did not  recommended surgical intervention.   Assessment & Plan:   Principal Problem:   Closed fracture of right distal femur (HCC) Active Problems:   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)   Anemia of chronic disease   Chronic kidney disease, stage 4 (severe) (HCC)   Generalized anxiety disorder   Mutism   Sensorineural hearing loss (SNHL) of both ears   Severe obesity (HCC)  Closed fracture of right distal femur (HCC) Secondary to mechanical falls. Orthopedic surgery was consulted recommended status post surgical intervention on 03/06/2023 narcotics per BTK surgery. Analgesics and DVT prophylaxis per orthopedic surgery, Eliquis twice a day for 30 days. Continue with analgesia as needed  Acute respiratory failure with hypoxia due to pulmonary edema: On the night of 03/14/2023, patient was morning, she was given Dilaudid, post pain medication, patient started having shortness of breath and became hypoxic requiring 6 L of oxygen.  Chest x-ray was obtained which showed vascular congestion.  No infiltrates.  Patient was weaned down to 2 to 3 L.  No known history of congestive heart failure, no echo in chart.  Will order echo.  Also she does have crackles and rhonchi today.  Will give her Lasix 40 mg IV twice daily starting today.  Will provide incentive spirometry.  Discussed with the daughter that patient will need reminder every hour to use that.   Acute metabolic  encephalopathy: Likely secondary to below sepsis with PNA.  Held Robaxin and melatonin. CT of the head reviewed, neg Mentation now much improved, conversing and following commands   Sepsis secondary to aspiration pneumonia, not present on admission:  On 3/6, p t had increased lethargy, with fevers up to 101.48F as of 3/6 and tachypnea Ordered and reviewed CXR, findings of L>R patchy basilar airspace disease with repeat CXR demonstrating worse changes. SARS-CoV-2 influenza and RSV PCR negative.  Patient improved on azithromycin and Rocephin and she has completed the course as well.   Essential hypertension: Blood pressure controlled, cont on norvasc as tolerated   New acute kidney injury on chronic kidney disease stage IIIb-patient does not have CKD stage IV: Last creatinine in chart is from December 2022 which was 2.09 with GFR of 24.  However prior to that, creatinine was around 1.5 constantly making it CKD stage IIIb.  Patient's creatinine upon arrival was at baseline but rose and peaked at 3.19 on 03/06/2023.  Creatinine continues to improve and down to 1.6 which is close to her baseline.  Avoid nephrotoxic agents.     Anxiety/delusion/auditory elucidation/Alzheimer's dementia: Continue Lexapro and Namenda.   OAB: Continue oxybutynin.   Morbid obesity: Weight loss and diet modification counseled.   Hypernatremia Sodium remains stable around 150.  Sacral decubitus ulcer present on admission stage II: RN Pressure Injury Documentation:        Pressure Injury 03/04/23 Other (Comment) Medial Stage 2 -  Partial thickness loss of dermis presenting as a shallow open injury with a red, pink wound bed without slough. (Active)  03/04/23 2000  Location:  Other (Comment)  Location Orientation: Medial  Staging: Stage 2 -  Partial thickness loss of dermis presenting as a shallow open injury with a red, pink wound bed without slough.  Wound Description (Comments):   Present on Admission: Yes   Dressing Type Foam - Lift dressing to assess site every shift 03/06/23 1930    Acute blood loss anemia -Likely secondary to recent surgery -improved after 1 unit PRBC transfusion.  Currently stable around 8.   FEN/dysphagia: -Patient maintained on tube feeds, SLP following, started on dysphagia 1 diet 03/13/2023.  Slow weaning from tube feeds per dietitian and SLP.  Underwent MBS 03/14/2023.  DVT prophylaxis: enoxaparin (LOVENOX) injection 40 mg Start: 03/10/23 1600 SCDs Start: 03/05/23 1647 SCDs Start: 03/04/23 1751   Code Status: Full Code  Family Communication: Daughter present at bedside.   Status is: Inpatient: still tube feed dependent   Estimated body mass index is 36.46 kg/m as calculated from the following:   Height as of this encounter: 5\' 9"  (1.753 m).   Weight as of this encounter: 112 kg.  Pressure Injury 03/04/23 Other (Comment) Medial Stage 2 -  Partial thickness loss of dermis presenting as a shallow open injury with a red, pink wound bed without slough. (Active)  03/04/23 2000  Location: Other (Comment) (intergluteal cleft)  Location Orientation: Medial  Staging: Stage 2 -  Partial thickness loss of dermis presenting as a shallow open injury with a red, pink wound bed without slough.  Wound Description (Comments):   Present on Admission: Yes  Dressing Type Foam - Lift dressing to assess site every shift 03/15/23 0554   Nutritional Assessment: Body mass index is 36.46 kg/m.Marland Kitchen Seen by dietician.  I agree with the assessment and plan as outlined below: Nutrition Status: Nutrition Problem: Inadequate oral intake Etiology: inability to eat, dysphagia Signs/Symptoms: NPO status Interventions: Tube feeding  . Skin Assessment: I have examined the patient's skin and I agree with the wound assessment as performed by the wound care RN as outlined below: Pressure Injury 03/04/23 Other (Comment) Medial Stage 2 -  Partial thickness loss of dermis presenting as a shallow  open injury with a red, pink wound bed without slough. (Active)  03/04/23 2000  Location: Other (Comment) (intergluteal cleft)  Location Orientation: Medial  Staging: Stage 2 -  Partial thickness loss of dermis presenting as a shallow open injury with a red, pink wound bed without slough.  Wound Description (Comments):   Present on Admission: Yes  Dressing Type Foam - Lift dressing to assess site every shift 03/15/23 0554    Consultants:  Orthopedics  Procedures:  As above  Antimicrobials:  Anti-infectives (From admission, onward)    Start     Dose/Rate Route Frequency Ordered Stop   03/12/23 1215  azithromycin (ZITHROMAX) 500 mg in sodium chloride 0.9 % 250 mL IVPB  Status:  Discontinued        500 mg 250 mL/hr over 60 Minutes Intravenous  Once 03/12/23 1115 03/12/23 1116   03/08/23 1400  cefTRIAXone (ROCEPHIN) 2 g in sodium chloride 0.9 % 100 mL IVPB        2 g 200 mL/hr over 30 Minutes Intravenous Daily 03/08/23 1333 03/12/23 1100   03/08/23 1400  azithromycin (ZITHROMAX) 500 mg in sodium chloride 0.9 % 250 mL IVPB        500 mg 250 mL/hr over 60 Minutes Intravenous Daily 03/08/23 1333 03/12/23 1400   03/05/23 2100  ceFAZolin (ANCEF) IVPB 2g/100 mL premix  2 g 200 mL/hr over 30 Minutes Intravenous Every 8 hours 03/05/23 1646 03/06/23 1552   03/05/23 1317  vancomycin (VANCOCIN) powder  Status:  Discontinued          As needed 03/05/23 1317 03/05/23 1426   03/05/23 1030  ceFAZolin (ANCEF) IVPB 2g/100 mL premix        2 g 200 mL/hr over 30 Minutes Intravenous On call to O.R. 03/05/23 4098 03/05/23 1310         Subjective: Seen and examined.  Daughter as well as sign language interpreter was present at the bedside.  Patient was lethargic but arousable.  Patient denies any shortness of breath but did appear very weak.  Objective: Vitals:   03/14/23 0947 03/14/23 1202 03/14/23 2019 03/15/23 0408  BP: (!) 125/51 (!) 120/51 (!) 142/50 (!) 128/54  Pulse:  61 68 65   Resp:  20 16 18   Temp:  97.8 F (36.6 C) 98.3 F (36.8 C) 97.6 F (36.4 C)  TempSrc:  Oral Oral Oral  SpO2:  100% 91% 100%  Weight:      Height:        Intake/Output Summary (Last 24 hours) at 03/15/2023 0815 Last data filed at 03/14/2023 2152 Gross per 24 hour  Intake 250 ml  Output --  Net 250 ml   Filed Weights   03/12/23 0435 03/13/23 0450 03/14/23 0500  Weight: 114.4 kg 114.6 kg 112 kg    Examination:  General exam: Appears weak and drowsy Respiratory system: Rhonchi and coarse bilaterally. Respiratory effort normal. Cardiovascular system: S1 & S2 heard, RRR. No JVD, murmurs, rubs, gallops or clicks. No pedal edema. Gastrointestinal system: Abdomen is nondistended, soft and nontender. No organomegaly or masses felt. Normal bowel sounds heard. Central nervous system: Alert and oriented. No focal neurological deficits. Extremities: Symmetric 5 x 5 power. Skin: No rashes, lesions or ulcers.   Data Reviewed: I have personally reviewed following labs and imaging studies  CBC: Recent Labs  Lab 03/10/23 1316 03/11/23 0513 03/12/23 0528 03/13/23 0436 03/14/23 0408  WBC 9.0 9.2 10.3 11.2* 13.0*  HGB 7.9* 7.5* 7.9* 8.1* 8.2*  HCT 25.2* 24.2* 26.1* 27.2* 27.2*  MCV 92.0 92.4 93.9 96.5 95.1  PLT 211 228 275 334 312   Basic Metabolic Panel: Recent Labs  Lab 03/09/23 1927 03/10/23 0454 03/10/23 1316 03/10/23 1639 03/11/23 0513 03/12/23 0528 03/13/23 0436 03/14/23 0408 03/15/23 0414  NA  --   --    < >  --  148* 151* 151* 150* 140  K  --   --    < >  --  3.8 4.0 3.9 4.2 4.2  CL  --   --    < >  --  119* 121* 118* 116* 107  CO2  --   --    < >  --  22 22 22 26 24   GLUCOSE  --   --    < >  --  146* 108* 114* 117* 113*  BUN  --   --    < >  --  51* 51* 53* 59* 62*  CREATININE  --   --    < >  --  1.59* 1.50* 1.48* 1.60* 1.66*  CALCIUM  --   --    < >  --  7.7* 8.1* 8.4* 8.6* 8.2*  MG 2.3 2.2  --  2.3 2.2  --   --   --   --   PHOS 3.3 2.7  --  2.2* 2.0*  --    --   --   --    < > = values in this interval not displayed.   GFR: Estimated Creatinine Clearance: 34.9 mL/min (A) (by C-G formula based on SCr of 1.66 mg/dL (H)). Liver Function Tests: Recent Labs  Lab 03/10/23 1316 03/11/23 0513 03/12/23 0528 03/13/23 0436 03/14/23 0408  AST 42* 35 30 27 27   ALT <5 <5 <5 <5 7  ALKPHOS 35* 35* 34* 43 41  BILITOT 0.5 0.7 0.6 0.6 0.6  PROT 5.2* 5.0* 5.4* 5.4* 5.6*  ALBUMIN 2.0* 2.0* 2.2* 2.1* 2.2*   No results for input(s): "LIPASE", "AMYLASE" in the last 168 hours. No results for input(s): "AMMONIA" in the last 168 hours. Coagulation Profile: No results for input(s): "INR", "PROTIME" in the last 168 hours. Cardiac Enzymes: No results for input(s): "CKTOTAL", "CKMB", "CKMBINDEX", "TROPONINI" in the last 168 hours. BNP (last 3 results) No results for input(s): "PROBNP" in the last 8760 hours. HbA1C: No results for input(s): "HGBA1C" in the last 72 hours. CBG: Recent Labs  Lab 03/14/23 1201 03/14/23 1630 03/14/23 2057 03/15/23 0024 03/15/23 0525  GLUCAP 106* 101* 142* 122* 110*   Lipid Profile: No results for input(s): "CHOL", "HDL", "LDLCALC", "TRIG", "CHOLHDL", "LDLDIRECT" in the last 72 hours. Thyroid Function Tests: No results for input(s): "TSH", "T4TOTAL", "FREET4", "T3FREE", "THYROIDAB" in the last 72 hours. Anemia Panel: No results for input(s): "VITAMINB12", "FOLATE", "FERRITIN", "TIBC", "IRON", "RETICCTPCT" in the last 72 hours. Sepsis Labs: Recent Labs  Lab 03/08/23 1409 03/08/23 1556  LATICACIDVEN 1.1 1.0    Recent Results (from the past 240 hours)  Culture, blood (Routine X 2) w Reflex to ID Panel     Status: None   Collection Time: 03/08/23  2:09 PM   Specimen: BLOOD RIGHT ARM  Result Value Ref Range Status   Specimen Description BLOOD RIGHT ARM  Final   Special Requests   Final    BOTTLES DRAWN AEROBIC AND ANAEROBIC Blood Culture results may not be optimal due to an inadequate volume of blood received in  culture bottles   Culture   Final    NO GROWTH 5 DAYS Performed at [facility], [address] [place], [place] [address]    Report Status 03/13/2023 FINAL  Final  Culture, blood (Routine X 2) w Reflex to ID Panel     Status: None   Collection Time: 03/08/23  2:09 PM   Specimen: BLOOD RIGHT ARM  Result Value Ref Range Status   Specimen Description BLOOD RIGHT ARM  Final   Special Requests   Final    BOTTLES DRAWN AEROBIC AND ANAEROBIC Blood Culture results may not be optimal due to an inadequate volume of blood received in culture bottles   Culture   Final    NO GROWTH 5 DAYS Performed at [facility], [address], [place], [place] [address]    Report Status 03/13/2023 FINAL  Final     Radiology Studies: DG CHEST PORT 1 VIEW Result Date: 03/14/2023 CLINICAL DATA:  Shortness of breath EXAM: PORTABLE CHEST 1 VIEW COMPARISON:  03/09/2023, 03/08/2023 FINDINGS: Enteric tube tip below the diaphragm but incompletely assessed. Cardiomegaly with slightly improved aeration of left base. Residual vascular congestion. Aortic atherosclerosis. IMPRESSION: Cardiomegaly with mild vascular congestion. Slightly improved aeration of left base. Electronically Signed   By: Jasmine Pang M.D.   On: 03/14/2023 23:54    Scheduled Meds:  acetaminophen  650 mg Per Tube Q6H  amLODipine  5 mg Per Tube Daily   ascorbic acid  500 mg Per Tube BID   dorzolamide-timolol  1 drop Both Eyes BID   enoxaparin (LOVENOX) injection  40 mg Subcutaneous Q24H   escitalopram  10 mg Per Tube Daily   feeding supplement (PROSource TF20)  60 mL Per Tube BID   fiber supplement (BANATROL TF)  60 mL Per Tube BID   free water  200 mL Per Tube Q2H   Gerhardt's butt cream   Topical BID   memantine  10 mg Per Tube BID   multivitamin with minerals  1 tablet Per Tube Daily   oxybutynin  5 mg Per Tube TID   pneumococcal 20-valent conjugate vaccine  0.5 mL Intramuscular Tomorrow-1000   thiamine  100 mg Per Tube Daily    zinc sulfate (50mg  elemental zinc)  220 mg Per Tube Daily   Continuous Infusions:  feeding supplement (OSMOLITE 1.5 CAL) 1,000 mL (03/14/23 0604)     LOS: 11 days   Hughie Closs, MD [facility]  03/15/2023, 8:15 AM   *Please note that this is a verbal dictation therefore any spelling or grammatical errors are due to the "Dragon [employer] One" system interpretation.  Please page via Amion and do not message via secure chat for urgent patient care matters. Secure chat can be used for non urgent patient care matters.  How to contact the [facility] Attending or Consulting provider 7A - 7P or covering provider during after hours 7P -7A, for this patient?  Check the care team in [facility] and look for a) attending/consulting [facility] provider listed and b) the [facility] team listed. Page or secure chat 7A-7P. Log into [email or web address] and use [facility] universal password to access. If you do not have the password, please contact the hospital operator. Locate the [facility] provider you are looking for under [facility] and page to a number that you can be directly reached. If you still have difficulty reaching the provider, please page the [facility] (Director on Call) for the Hospitalists listed on amion for assistance.

## 2023-03-16 DIAGNOSIS — S72401A Unspecified fracture of lower end of right femur, initial encounter for closed fracture: Secondary | ICD-10-CM | POA: Diagnosis not present

## 2023-03-16 LAB — BASIC METABOLIC PANEL
Anion gap: 5 (ref 5–15)
BUN: 76 mg/dL — ABNORMAL HIGH (ref 8–23)
CO2: 25 mmol/L (ref 22–32)
Calcium: 8.3 mg/dL — ABNORMAL LOW (ref 8.9–10.3)
Chloride: 107 mmol/L (ref 98–111)
Creatinine, Ser: 1.87 mg/dL — ABNORMAL HIGH (ref 0.44–1.00)
GFR, Estimated: 27 mL/min — ABNORMAL LOW (ref >60)
Glucose, Bld: 109 mg/dL — ABNORMAL HIGH (ref 70–99)
Potassium: 4.5 mmol/L (ref 3.5–5.1)
Sodium: 137 mmol/L (ref 135–145)

## 2023-03-16 LAB — CBC WITH DIFFERENTIAL/PLATELET
Abs Immature Granulocytes: 1.66 10*3/uL — ABNORMAL HIGH (ref 0.00–0.07)
Basophils Absolute: 0.1 10*3/uL (ref 0.0–0.1)
Basophils Relative: 0 %
Eosinophils Absolute: 0.4 10*3/uL (ref 0.0–0.5)
Eosinophils Relative: 3 %
HCT: 26.4 % — ABNORMAL LOW (ref 36.0–46.0)
Hemoglobin: 8.3 g/dL — ABNORMAL LOW (ref 12.0–15.0)
Immature Granulocytes: 12 %
Lymphocytes Relative: 10 %
Lymphs Abs: 1.4 10*3/uL (ref 0.7–4.0)
MCH: 29.3 pg (ref 26.0–34.0)
MCHC: 31.4 g/dL (ref 30.0–36.0)
MCV: 93.3 fL (ref 80.0–100.0)
Monocytes Absolute: 1 10*3/uL (ref 0.1–1.0)
Monocytes Relative: 7 %
Neutro Abs: 9 10*3/uL — ABNORMAL HIGH (ref 1.7–7.7)
Neutrophils Relative %: 68 %
Platelets: 322 10*3/uL (ref 150–400)
RBC: 2.83 MIL/uL — ABNORMAL LOW (ref 3.87–5.11)
RDW: 19.7 % — ABNORMAL HIGH (ref 11.5–15.5)
Smear Review: NORMAL
WBC: 13.5 10*3/uL — ABNORMAL HIGH (ref 4.0–10.5)
nRBC: 1 % — ABNORMAL HIGH (ref 0.0–0.2)

## 2023-03-16 LAB — GLUCOSE, CAPILLARY
Glucose-Capillary: 97 mg/dL (ref 70–99)
Glucose-Capillary: 104 mg/dL — ABNORMAL HIGH (ref 70–99)
Glucose-Capillary: 109 mg/dL — ABNORMAL HIGH (ref 70–99)
Glucose-Capillary: 114 mg/dL — ABNORMAL HIGH (ref 70–99)
Glucose-Capillary: 98 mg/dL (ref 70–99)
Glucose-Capillary: 102 mg/dL — ABNORMAL HIGH (ref 70–99)
Glucose-Capillary: 117 mg/dL — ABNORMAL HIGH (ref 70–99)

## 2023-03-16 MED ORDER — OSMOLITE 1.5 CAL PO LIQD
585.0000 mL | ORAL | Status: DC
  Administered 2023-03-16: 585 mL
  Filled 2023-03-16 (×13): qty 711

## 2023-03-16 MED ORDER — FREE WATER
150.0000 mL | Status: DC
  Administered 2023-03-16 – 2023-03-22 (×15): 150 mL

## 2023-03-16 NOTE — Progress Notes (Signed)
 Nutrition Follow-up  DOCUMENTATION CODES:   Obesity unspecified  INTERVENTION:  Adjust TF via Cortrak to nocturnal feeds: Osmolite 1.5 @ 65 ml/hr for 9 hours (2000-0500) 60 ml Prosource TF BID  90 ml free water flush every 4 hours Tube feeding regimen provides 958 kcal (53% of needs), 57 g protein (57% of needs), and 805 ml total water (TF + FWF)  Discussed "IDDSI Level 4 Pureed Nutrition Therapy" handout with pt and family, given pt will go home on dysphagia 1 diet Banatrol BID  Monitor po intake on dysphagia 1 diet; advance per SLP recommendations Magic cup TID with meals, each supplement provides 290 kcal and 9 grams of protein MVI with minerals daily Continue vitamin C and zinc as ordered Continue 100 mg thiamine daily as ordered  NUTRITION DIAGNOSIS:   Inadequate oral intake related to inability to eat, dysphagia as evidenced by NPO status.  Being addressed  GOAL:   Patient will meet greater than or equal to 90% of their needs  Met through TF  MONITOR:   Diet advancement, TF tolerance  REASON FOR ASSESSMENT:   Consult Assessment of nutrition requirement/status  ASSESSMENT:   Pt with medical history significant of hypertension, CKD 4, anxiety, hallucination, delusion, Alzheimer's dementia, anemia, deaf, mute, OAB, obesity presenting with multiple falls and knee pain. Found to have right distal femur fracture  3/3- s/p Open reduction internal fixation of right supracondylar distal femur fracture  3/6- s/p BSE- recommend NPO with nutrition via alternative means 3/7: cortrak placed 3/11: diet advanced to dysphagia 1 with honey thick liquids 3/12: MBS. Dysphagia is severe. Continue current diet per SLP  Pt's daughter reports she has been eating great yesterday and today. She reports pt likes magic cup and the thickened juices. Discussed with daughter that we will switch pt to nocturnal feeds to stimulate her appetite during the day. Daughter asked when we think  tube feedings can be stopped. Discussed that we will monitor po intake with nocturnal feedings and assess from there. Predict pt will have good po intake, given that her intake has been great the last 2 days while still receiving continuous feeds.  Went over pureed foods handout with daughter to give her some ideas of purees to prepare at home for dysphagia 1 diet. Reminded daughter that pt is on honey thick liquids and showed her what the consistency of the puree should look like. Daughter reports she received education on the consistency of the foods from other staff as well. She plans on purchasing already made purees and making some of her own.  Meds: vitamin c, zinc, colace, prosource BID per tube, MVI, thiamine, osmolite 1.5 50 ml/hr, reglan PRN, miralax PRN, banatrol BID   Labs: CBGs range from 105-142, BUN 62, Creatinine 1.66   Diet Order:   Diet Order             DIET - DYS 1 Fluid consistency: Honey Thick  Diet effective now                   EDUCATION NEEDS:   Not appropriate for education at this time  Skin:  Skin Assessment: Skin Integrity Issues: Skin Integrity Issues:: Stage II, Incisions Stage II: sacrum Incisions: closed rt leg  Last BM:  3/13-type 7  Height:   Ht Readings from Last 1 Encounters:  03/05/23 5\' 9"  (1.753 m)    Weight:   Wt Readings from Last 1 Encounters:  03/16/23 123.8 kg    Ideal Body Weight:  65.9 kg  BMI:  Body mass index is 40.3 kg/m.  Estimated Nutritional Needs:   Kcal:  1800-2000  Protein:  100-115 grams  Fluid:  1.8-2.0 L    Maceo Pro, MS Dietetic Intern

## 2023-03-16 NOTE — Progress Notes (Signed)
 Physical Therapy Treatment Patient Details Name: Sharon Ramos MRN: [id number] DOB: 1940/03/17 Today's Date: 03/16/2023   History of Present Illness Patient is a 83 year old female with  ground-level fall with a right supracondylar periprosthetic distal femur fracture. S/p ORIF.  History of hypertension, CKD 4, anxiety, hallucination, Alzheimer's dementia, anemia, deaf, mute, OAB, and obesity.   PT Comments  Pt greeted supine in bed, pleasant and agreeable to PT session. She participated in supine BLE exercises where she was able to hold a quad set for ~5 seconds and complete B hip/knee flex to ~30deg with minA. Pt participated in bed mobility, bringing BLE towards EOB with modA and requiring maxA x2 to sit EOB with feet supported. She sat EOB for ~15 minutes with varying levels of assist. She was able to reach forward 10 times with LUE. As pt fatigued increased R lateral and posterior lean noted. ASL interpreter present throughout session. Patient will benefit from continued inpatient follow up therapy, <3 hours/day. Will continue to follow acutely and advance appropriately.    If plan is discharge home, recommend the following: Two people to help with walking and/or transfers;Two people to help with bathing/dressing/bathroom;Assistance with cooking/housework;Assistance with feeding;Direct supervision/assist for medications management;Direct supervision/assist for financial management;Assist for transportation;Help with stairs or ramp for entrance;Supervision due to cognitive status   Can travel by private vehicle     No  Equipment Recommendations  Hoyer lift;Hospital bed;Wheelchair (measurements PT);Wheelchair cushion (measurements PT);BSC/3in1    Recommendations for Other Services       Precautions / Restrictions Precautions Precautions: Fall Recall of Precautions/Restrictions: Impaired Precaution/Restrictions Comments: Cortrack; Rectal Tube Restrictions Weight Bearing Restrictions  Per Provider Order: Yes RLE Weight Bearing Per Provider Order: Weight bearing as tolerated     Mobility  Bed Mobility Overal bed mobility: Needs Assistance Bed Mobility: Supine to Sit, Sit to Supine     Supine to sit: Max assist, +2 for physical assistance Sit to supine: Max assist, +2 for physical assistance   General bed mobility comments: Pt sat up on R side of bed with increased time. She was able to assist in bringing BLE towards EOB with modA. She reached fwd towards PT to assist in bringing back off bed, cued her to use bedrails and push down on bed to aid in bringing trunk up. MaxA x2 using bed pad to pivot pt, bring BLE off bed, sit upright, and scoot pt fwd til feet support on ground. Returning to supine pt required maxA x2. Repositioned using bed features.    Transfers                   General transfer comment: Deferred    Ambulation/Gait               General Gait Details: Deferred   Stairs             Wheelchair Mobility     Tilt Bed    Modified Rankin (Stroke Patients Only)       Balance Overall balance assessment: Needs assistance Sitting-balance support: Feet supported, Bilateral upper extremity supported, Single extremity supported Sitting balance-Leahy Scale: Fair Sitting balance - Comments: Pt sat EOB for ~65mins with RUE on handrail and LUE on bed. Initially was maxA to maintain balance, but was able to lessen to CGA with VC/TC to adjust posture. Pt engaged in fwd reaches with LUE. Attempted with RUE, but required maxA to return to upright posture.  As pt fatigued required increased support at trunk and  R lateral and posterior lean worsened. Postural control: Right lateral lean, Posterior lean                                  Communication Communication Communication: Impaired Factors Affecting Communication: Hearing impaired;Difficulty expressing self  Cognition Arousal: Alert Behavior During Therapy: Flat  affect   PT - Cognitive impairments: Difficult to assess Difficult to assess due to: Hard of hearing/deaf                     PT - Cognition Comments: In-person ASL interpreter aided with translation Following commands: Impaired Following commands impaired: Follows one step commands with increased time    Cueing Cueing Techniques: Gestural cues, Tactile cues, Visual cues  Exercises General Exercises - Lower Extremity Ankle Circles/Pumps: Supine, Both, 10 reps, AROM Quad Sets: Supine, Both, 10 reps, Strengthening (with 5 second hold) Heel Slides: Supine, AAROM, Both, 10 reps    General Comments General comments (skin integrity, edema, etc.): VSS on RA. Intermittent suctioning required d/t coughing up phlegm.      Pertinent Vitals/Pain Pain Assessment Pain Assessment: PAINAD Breathing: normal Negative Vocalization: occasional moan/groan, low speech, negative/disapproving quality Facial Expression: facial grimacing Body Language: relaxed Consolability: no need to console PAINAD Score: 3 Pain Location: R knee Pain Descriptors / Indicators: Grimacing, Guarding, Sore Pain Intervention(s): Monitored during session, Limited activity within patient's tolerance    Home Living                          Prior Function            PT Goals (current goals can now be found in the care plan section) Acute Rehab PT Goals Patient Stated Goal: Get better Progress towards PT goals: Progressing toward goals    Frequency    Min 2X/week      PT Plan      Co-evaluation              AM-PAC PT "6 Clicks" Mobility   Outcome Measure  Help needed turning from your back to your side while in a flat bed without using bedrails?: Total Help needed moving from lying on your back to sitting on the side of a flat bed without using bedrails?: Total Help needed moving to and from a bed to a chair (including a wheelchair)?: Total Help needed standing up from a chair  using your arms (e.g., wheelchair or bedside chair)?: Total Help needed to walk in hospital room?: Total Help needed climbing 3-5 steps with a railing? : Total 6 Click Score: 6    End of Session   Activity Tolerance: Patient tolerated treatment well Patient left: in bed;with call bell/phone within reach;with family/visitor present Nurse Communication: Mobility status PT Visit Diagnosis: Muscle weakness (generalized) (M62.81);Unsteadiness on feet (R26.81)     Time: 1610-9604 PT Time Calculation (min) (ACUTE ONLY): 31 min  Charges:    $Therapeutic Exercise: 8-22 mins $Therapeutic Activity: 8-22 mins PT General Charges $$ ACUTE PT VISIT: 1 Visit                     Cheri Guppy, PT, DPT Acute Rehabilitation Services Office: [phone] Secure Chat Preferred  Richardson Chiquito 03/16/2023, 12:35 PM

## 2023-03-16 NOTE — Progress Notes (Signed)
 PROGRESS NOTE    ASSYRIA Ramos  EAV:[id number] DOB: 03/22/1940 DOA: 03/04/2023 PCP: Theodis Shove, DO   Brief Narrative:  83 y.o. 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  CT of the knee showed comminuted periprosthetic distal femur fracture, orthopedic surgery was consulted, later did not  recommended surgical intervention.   Assessment & Plan:   Principal Problem:   Closed fracture of right distal femur (HCC) Active Problems:   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)   Anemia of chronic disease   Chronic kidney disease, stage 4 (severe) (HCC)   Generalized anxiety disorder   Mutism   Sensorineural hearing loss (SNHL) of both ears   Severe obesity (HCC)  Closed fracture of right distal femur (HCC) Secondary to mechanical falls. Orthopedic surgery was consulted recommended status post surgical intervention on 03/06/2023 narcotics per BTK surgery. Analgesics and DVT prophylaxis per orthopedic surgery, Eliquis twice a day for 30 days. Continue with analgesia as needed  Acute respiratory failure with hypoxia due to pulmonary edema/acute on chronic diastolic congestive heart failure: On the night of 03/14/2023, patient was morning, she was given Dilaudid, post pain medication, patient started having shortness of breath and became hypoxic requiring 6 L of oxygen.  Chest x-ray was obtained which showed vascular congestion.  No infiltrates.  Patient was weaned down to 2 to 3 L.  No known history of congestive heart failure, no echo in chart.  Ordered echo here which shows normal ejection fraction and grade 2 diastolic dysfunction.  Started on Lasix 40 mg IV twice daily yesterday.  Hypoxia resolved.  Very faint crackles, much improved compared to  yesterday.  Will give her 2 more doses of Lasix today.     Acute metabolic encephalopathy: Likely secondary to below sepsis with PNA.  Held Robaxin and melatonin. CT of the head reviewed, neg Mentation now much improved, conversing and following commands   Sepsis secondary to aspiration pneumonia, not present on admission:  On 3/6, p t had increased lethargy, with fevers up to 101.13F as of 3/6 and tachypnea Ordered and reviewed CXR, findings of L>R patchy basilar airspace disease with repeat CXR demonstrating worse changes. SARS-CoV-2 influenza and RSV PCR negative.  Patient improved on azithromycin and Rocephin and she has completed the course as well.   Essential hypertension: Blood pressure controlled, cont on norvasc as tolerated   New acute kidney injury on chronic kidney disease stage IIIb-patient does not have CKD stage IV: Last creatinine in chart is from December 2022 which was 2.09 with GFR of 24.  However prior to that, creatinine was around 1.5 constantly making it CKD stage IIIb.  Patient's creatinine upon arrival was at baseline but rose and peaked at 3.19 on 03/06/2023.  Creatinine continues to improve and down to 1.6 which is close to her baseline.  Avoid nephrotoxic agents.     Anxiety/delusion/auditory elucidation/Alzheimer's dementia: Continue Lexapro and Namenda.   OAB: Continue oxybutynin.   Morbid obesity: Weight loss and diet modification counseled.   Hypernatremia Sodium remains stable around 150.  Sacral decubitus ulcer present on admission stage II: RN Pressure Injury Documentation:        Pressure Injury 03/04/23 Other (Comment) Medial Stage 2 -  Partial thickness loss of dermis presenting as a shallow open injury with a red, pink wound  bed without slough. (Active)  03/04/23 2000  Location: Other (Comment)  Location Orientation: Medial  Staging: Stage 2 -  Partial thickness loss of dermis presenting as a shallow open injury with a red, pink wound bed  without slough.  Wound Description (Comments):   Present on Admission: Yes  Dressing Type Foam - Lift dressing to assess site every shift 03/06/23 1930    Acute blood loss anemia -Likely secondary to recent surgery -improved after 1 unit PRBC transfusion.  Currently stable around 8.   FEN/dysphagia: -Patient maintained on tube feeds, SLP following, started on dysphagia 1 diet 03/13/2023.  Slow weaning from tube feeds per dietitian and SLP.  Underwent MBS 03/14/2023.  DVT prophylaxis: enoxaparin (LOVENOX) injection 40 mg Start: 03/10/23 1600 SCDs Start: 03/05/23 1647 SCDs Start: 03/04/23 1751   Code Status: Full Code  Family Communication: Daughter present at bedside.   Status is: Inpatient: still tube feed dependent   Estimated body mass index is 40.3 kg/m as calculated from the following:   Height as of this encounter: 5\' 9"  (1.753 m).   Weight as of this encounter: 123.8 kg.  Pressure Injury 03/04/23 Other (Comment) Medial Stage 2 -  Partial thickness loss of dermis presenting as a shallow open injury with a red, pink wound bed without slough. (Active)  03/04/23 2000  Location: Other (Comment) (intergluteal cleft)  Location Orientation: Medial  Staging: Stage 2 -  Partial thickness loss of dermis presenting as a shallow open injury with a red, pink wound bed without slough.  Wound Description (Comments):   Present on Admission: Yes  Dressing Type Foam - Lift dressing to assess site every shift 03/15/23 2039   Nutritional Assessment: Body mass index is 40.3 kg/m.Marland Kitchen Seen by dietician.  I agree with the assessment and plan as outlined below: Nutrition Status: Nutrition Problem: Inadequate oral intake Etiology: inability to eat, dysphagia Signs/Symptoms: NPO status Interventions: Tube feeding  . Skin Assessment: I have examined the patient's skin and I agree with the wound assessment as performed by the wound care RN as outlined below: Pressure Injury 03/04/23 Other  (Comment) Medial Stage 2 -  Partial thickness loss of dermis presenting as a shallow open injury with a red, pink wound bed without slough. (Active)  03/04/23 2000  Location: Other (Comment) (intergluteal cleft)  Location Orientation: Medial  Staging: Stage 2 -  Partial thickness loss of dermis presenting as a shallow open injury with a red, pink wound bed without slough.  Wound Description (Comments):   Present on Admission: Yes  Dressing Type Foam - Lift dressing to assess site every shift 03/15/23 2039    Consultants:  Orthopedics  Procedures:  As above  Antimicrobials:  Anti-infectives (From admission, onward)    Start     Dose/Rate Route Frequency Ordered Stop   03/12/23 1215  azithromycin (ZITHROMAX) 500 mg in sodium chloride 0.9 % 250 mL IVPB  Status:  Discontinued        500 mg 250 mL/hr over 60 Minutes Intravenous  Once 03/12/23 1115 03/12/23 1116   03/08/23 1400  cefTRIAXone (ROCEPHIN) 2 g in sodium chloride 0.9 % 100 mL IVPB        2 g 200 mL/hr over 30 Minutes Intravenous Daily 03/08/23 1333 03/12/23 1100   03/08/23 1400  azithromycin (ZITHROMAX) 500 mg in sodium chloride 0.9 % 250 mL IVPB        500 mg 250 mL/hr over 60 Minutes Intravenous Daily 03/08/23 1333 03/12/23 1400   03/05/23 2100  ceFAZolin (ANCEF) IVPB 2g/100 mL premix        2 g 200 mL/hr over 30 Minutes Intravenous Every 8 hours 03/05/23 1646 03/06/23 1552   03/05/23 1317  vancomycin (VANCOCIN) powder  Status:  Discontinued          As needed 03/05/23 1317 03/05/23 1426   03/05/23 1030  ceFAZolin (ANCEF) IVPB 2g/100 mL premix        2 g 200 mL/hr over 30 Minutes Intravenous On call to O.R. 03/05/23 1478 03/05/23 1310         Subjective: Seen and examined.  Granddaughter at the bedside helping with the sign language.  Per her, patient is feeling well, no shortness of breath or any other complaint.  She appeared peaceful.  Objective: Vitals:   03/15/23 1900 03/15/23 1921 03/16/23 0339 03/16/23  0500  BP:  (!) 101/44 127/61   Pulse: 64 63 62   Resp:  16 16   Temp:  (!) 96.7 F (35.9 C) (!) 97.5 F (36.4 C)   TempSrc:  Oral Oral   SpO2: 98% 100% 100%   Weight:    123.8 kg  Height:        Intake/Output Summary (Last 24 hours) at 03/16/2023 0727 Last data filed at 03/16/2023 0400 Gross per 24 hour  Intake 1150 ml  Output 900 ml  Net 250 ml   Filed Weights   03/13/23 0450 03/14/23 0500 03/16/23 0500  Weight: 114.6 kg 112 kg 123.8 kg    Examination:  General exam: Appears calm and comfortable  Respiratory system: Faint crackles at the bases bilaterally, respiratory effort normal. Cardiovascular system: S1 & S2 heard, RRR. No JVD, murmurs, rubs, gallops or clicks.  +1 pitting edema bilateral lower extremity. Gastrointestinal system: Abdomen is nondistended, soft and nontender. No organomegaly or masses felt. Normal bowel sounds heard. Central nervous system: Alert and oriented. No focal neurological deficits. Extremities: Symmetric 5 x 5 power. Skin: No rashes, lesions or ulcers.    Data Reviewed: I have personally reviewed following labs and imaging studies  CBC: Recent Labs  Lab 03/11/23 0513 03/12/23 0528 03/13/23 0436 03/14/23 0408 03/16/23 0605  WBC 9.2 10.3 11.2* 13.0* 13.5*  NEUTROABS  --   --   --   --  PENDING  HGB 7.5* 7.9* 8.1* 8.2* 8.3*  HCT 24.2* 26.1* 27.2* 27.2* 26.4*  MCV 92.4 93.9 96.5 95.1 93.3  PLT 228 275 334 312 322   Basic Metabolic Panel: Recent Labs  Lab 03/09/23 1927 03/10/23 0454 03/10/23 1316 03/10/23 1639 03/11/23 0513 03/12/23 0528 03/13/23 0436 03/14/23 0408 03/15/23 0414 03/16/23 0605  NA  --   --    < >  --  148* 151* 151* 150* 140 137  K  --   --    < >  --  3.8 4.0 3.9 4.2 4.2 4.5  CL  --   --    < >  --  119* 121* 118* 116* 107 107  CO2  --   --    < >  --  22 22 22 26 24 25   GLUCOSE  --   --    < >  --  146* 108* 114* 117* 113* 109*  BUN  --   --    < >  --  51* 51* 53* 59* 62* 76*  CREATININE  --   --    < >   --  1.59* 1.50* 1.48* 1.60* 1.66* 1.87*  CALCIUM  --   --    < >  --  7.7* 8.1* 8.4* 8.6* 8.2* 8.3*  MG 2.3 2.2  --  2.3 2.2  --   --   --   --   --   PHOS 3.3 2.7  --  2.2* 2.0*  --   --   --   --   --    < > = values in this interval not displayed.   GFR: Estimated Creatinine Clearance: 32.7 mL/min (A) (by C-G formula based on SCr of 1.87 mg/dL (H)). Liver Function Tests: Recent Labs  Lab 03/10/23 1316 03/11/23 0513 03/12/23 0528 03/13/23 0436 03/14/23 0408  AST 42* 35 30 27 27   ALT <5 <5 <5 <5 7  ALKPHOS 35* 35* 34* 43 41  BILITOT 0.5 0.7 0.6 0.6 0.6  PROT 5.2* 5.0* 5.4* 5.4* 5.6*  ALBUMIN 2.0* 2.0* 2.2* 2.1* 2.2*   No results for input(s): "LIPASE", "AMYLASE" in the last 168 hours. No results for input(s): "AMMONIA" in the last 168 hours. Coagulation Profile: No results for input(s): "INR", "PROTIME" in the last 168 hours. Cardiac Enzymes: No results for input(s): "CKTOTAL", "CKMB", "CKMBINDEX", "TROPONINI" in the last 168 hours. BNP (last 3 results) No results for input(s): "PROBNP" in the last 8760 hours. HbA1C: No results for input(s): "HGBA1C" in the last 72 hours. CBG: Recent Labs  Lab 03/15/23 0525 03/15/23 0849 03/15/23 1202 03/15/23 1653 03/15/23 2051  GLUCAP 110* 110* 102* 105* 97   Lipid Profile: No results for input(s): "CHOL", "HDL", "LDLCALC", "TRIG", "CHOLHDL", "LDLDIRECT" in the last 72 hours. Thyroid Function Tests: No results for input(s): "TSH", "T4TOTAL", "FREET4", "T3FREE", "THYROIDAB" in the last 72 hours. Anemia Panel: No results for input(s): "VITAMINB12", "FOLATE", "FERRITIN", "TIBC", "IRON", "RETICCTPCT" in the last 72 hours. Sepsis Labs: No results for input(s): "PROCALCITON", "LATICACIDVEN" in the last 168 hours.   Recent Results (from the past 240 hours)  Culture, blood (Routine X 2) w Reflex to ID Panel     Status: None   Collection Time: 03/08/23  2:09 PM   Specimen: BLOOD RIGHT ARM  Result Value Ref Range Status   Specimen  Description BLOOD RIGHT ARM  Final   Special Requests   Final    BOTTLES DRAWN AEROBIC AND ANAEROBIC Blood Culture results may not be optimal due to an inadequate volume of blood received in culture bottles   Culture   Final    NO GROWTH 5 DAYS Performed at [facility], [address] [place], [place] [address]    Report Status 03/13/2023 FINAL  Final  Culture, blood (Routine X 2) w Reflex to ID Panel     Status: None   Collection Time: 03/08/23  2:09 PM   Specimen: BLOOD RIGHT ARM  Result Value Ref Range Status   Specimen Description BLOOD RIGHT ARM  Final   Special Requests   Final    BOTTLES DRAWN AEROBIC AND ANAEROBIC Blood Culture results may not be optimal due to an inadequate volume of blood received in culture bottles   Culture   Final    NO GROWTH 5 DAYS Performed at [facility], [address], [place], [place] [address]    Report Status 03/13/2023 FINAL  Final     Radiology Studies: ECHOCARDIOGRAM COMPLETE Result Date: 03/15/2023    ECHOCARDIOGRAM REPORT   Patient Name:   NYASHA RAHILLY Date of Exam: 03/15/2023 Medical Rec #:  [id number]          Height:       69.0 in Accession #:  [id number]         Weight:       246.9 lb Date of Birth:  [birth year]          BSA:          2.260 m Patient Age:    82 years           BP:           128/54 mmHg Patient Gender: F                  HR:           68 bpm. Exam Location:  Inpatient Procedure: 2D Echo, Cardiac Doppler and Color Doppler (Both Spectral and Color            Flow Doppler were utilized during procedure). Indications:    CHF  History:        Patient has no prior history of Echocardiogram examinations.  Sonographer:    Amy Chionchio Referring Phys: [id number] Rudolf Blizard IMPRESSIONS  1. Left ventricular ejection fraction, by estimation, is 60 to 65%. The left ventricle has normal function. The left ventricle has no regional wall motion abnormalities. There is mild left ventricular hypertrophy of the basal-septal  segment. Left ventricular diastolic parameters are consistent with Grade II diastolic dysfunction (pseudonormalization). Elevated left ventricular end-diastolic pressure.  2. Right ventricular systolic function is normal. The right ventricular size is mildly enlarged.  3. Left atrial size was severely dilated.  4. The mitral valve is normal in structure. No evidence of mitral valve regurgitation. No evidence of mitral stenosis.  5. The aortic valve is tricuspid. Aortic valve regurgitation is not visualized. Aortic valve sclerosis/calcification is present, without any evidence of aortic stenosis. Aortic valve area, by VTI measures 2.17 cm. Aortic valve mean gradient measures 7.0 mmHg. Aortic valve Vmax measures 1.60 m/s.  6. The inferior vena cava is normal in size with greater than 50% respiratory variability, suggesting right atrial pressure of 3 mmHg. FINDINGS  Left Ventricle: Left ventricular ejection fraction, by estimation, is 60 to 65%. The left ventricle has normal function. The left ventricle has no regional wall motion abnormalities. The left ventricular internal cavity size was normal in size. There is  mild left ventricular hypertrophy of the basal-septal segment. Left ventricular diastolic parameters are consistent with Grade II diastolic dysfunction (pseudonormalization). Elevated left ventricular end-diastolic pressure. Right Ventricle: The right ventricular size is mildly enlarged. No increase in right ventricular wall thickness. Right ventricular systolic function is normal. Left Atrium: Left atrial size was severely dilated. Right Atrium: Right atrial size was normal in size. Pericardium: There is no evidence of pericardial effusion. Mitral Valve: The mitral valve is normal in structure. No evidence of mitral valve regurgitation. No evidence of mitral valve stenosis. MV peak gradient, 5.8 mmHg. The mean mitral valve gradient is 2.0 mmHg. Tricuspid Valve: The tricuspid valve is normal in structure.  Tricuspid valve regurgitation is trivial. No evidence of tricuspid stenosis. Aortic Valve: The aortic valve is tricuspid. Aortic valve regurgitation is not visualized. Aortic valve sclerosis/calcification is present, without any evidence of aortic stenosis. Aortic valve mean gradient measures 7.0 mmHg. Aortic valve peak gradient measures 10.2 mmHg. Aortic valve area, by VTI measures 2.17 cm. Pulmonic Valve: The pulmonic valve was normal in structure. Pulmonic valve regurgitation is not visualized. No evidence of pulmonic stenosis. Aorta: The aortic root is normal in size and structure. Venous: The inferior vena cava is normal in size with greater than 50% respiratory variability,  suggesting right atrial pressure of 3 mmHg. IAS/Shunts: No atrial level shunt detected by color flow Doppler.  LEFT VENTRICLE PLAX 2D LVIDd:         4.70 cm      Diastology LVIDs:         3.20 cm      LV e' medial:    6.42 cm/s LV PW:         1.00 cm      LV E/e' medial:  15.7 LV IVS:        1.10 cm      LV e' lateral:   7.94 cm/s LVOT diam:     2.10 cm      LV E/e' lateral: 12.7 LV SV:         77 LV SV Index:   34 LVOT Area:     3.46 cm  LV Volumes (MOD) LV vol d, MOD A2C: 127.0 ml LV vol d, MOD A4C: 110.0 ml LV vol s, MOD A2C: 41.6 ml LV vol s, MOD A4C: 41.4 ml LV SV MOD A2C:     85.4 ml LV SV MOD A4C:     110.0 ml LV SV MOD BP:      78.4 ml RIGHT VENTRICLE RV Basal diam:  4.00 cm RV Mid diam:    3.70 cm TAPSE (M-mode): 1.7 cm LEFT ATRIUM            Index        RIGHT ATRIUM           Index LA Vol (A4C): 114.0 ml 50.45 ml/m  RA Area:     24.70 cm                                     RA Volume:   71.80 ml  31.78 ml/m  AORTIC VALVE                     PULMONIC VALVE AV Area (Vmax):    2.47 cm      PV Vmax:       0.94 m/s AV Area (Vmean):   2.10 cm      PV Peak grad:  3.6 mmHg AV Area (VTI):     2.17 cm AV Vmax:           160.00 cm/s AV Vmean:          122.000 cm/s AV VTI:            0.353 m AV Peak Grad:      10.2 mmHg AV Mean Grad:       7.0 mmHg LVOT Vmax:         114.00 cm/s LVOT Vmean:        73.800 cm/s LVOT VTI:          0.221 m LVOT/AV VTI ratio: 0.63  AORTA Ao Root diam: 3.40 cm MITRAL VALVE                TRICUSPID VALVE MV Area (PHT): 3.20 cm     TR Peak grad:   42.5 mmHg MV Area VTI:   2.31 cm     TR Vmax:        326.00 cm/s MV Peak grad:  5.8 mmHg MV Mean grad:  2.0 mmHg     SHUNTS MV Vmax:       1.20 m/s  Systemic VTI:  0.22 m MV Vmean:      71.6 cm/s    Systemic Diam: 2.10 cm MV Decel Time: 237 msec MV E velocity: 101.00 cm/s MV A velocity: 97.10 cm/s MV E/A ratio:  1.04 Armanda Magic MD Electronically signed by Armanda Magic MD Signature Date/Time: 03/15/2023/1:55:07 PM    Final    DG Swallowing Func-Speech Pathology Result Date: 03/15/2023 Table formatting from the original result was not included. Modified Barium Swallow Study Patient Details Name: DAIYA TAMER MRN: [id number] Date of Birth: 10/07/1940 Today's Date: 03/15/2023 HPI/PMH: HPI: XIANNA SIVERLING is an 83 y.o. 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  CT of the knee showed comminuted periprosthetic distal femur fracture, orthopedic surgery completed on 3/4. Clinical Impression: Pt alert, but participation limited due to weakness and pt poorly attentive to ASL interpreter.  Pt unable to follow most commands to cough or swallow.  Pt found to have thick pooled oral secretions, tinted brown from history of tobacco chewing. Pt left leaning throught test, difficult to reposition. Dysphagia characterized as severe (DIGEST score 3). There was prolonged oral holding, tremulous lingual tension and moderate oral residue. Oral transit somewhat hesitant with timely laryngeal closure, but delayed and effortful swallow intiation. Pt consistently  penetrated nectar and thin liquids before the swallow and aspirated during and after due to residue and oropharyngeal secretions.  Sensation intermittent, cough insufficient. Pt additionally had moderate residue at the PES though this was hard to visualize with pts position. Pt is at risk of aspiration with all textures. There was most significant aspiration and coughing with thin liquids. Decreasing bolus size to teaspoon with honey improved timing and control. Pt also tolerated puree well, but suspect oropharyngeal residue and secretions will be aspirated regardless of modified diet. Daughter and pt may want to consider allowing unrestricted diet with known risk given that thickening may not significantly improve outcomes in this case given pt difficulty in following abstract swallowing commands even with in person interpreter. Brief discussion with daughter after exam; she verbalized desire to continue Dys 1/honey thick liquids in order to reduce risk. Not ready to consider comfort feeding at this time. SLP encouraged oral care to decrease bacterial load as the most important objective. Factors that may increase risk of adverse event in presence of aspiration Rubye Oaks & Clearance Coots 2021): No data recorded Recommendations/Plan: Swallowing Evaluation Recommendations No data recorded Treatment Plan No data recorded Recommendations Recommendations for follow up therapy are one component of a multi-disciplinary discharge planning process, led by the attending physician.  Recommendations may be updated based on patient status, additional functional criteria and insurance authorization. Assessment: Orofacial Exam: Orofacial Exam Oral Cavity - Dentition: Poor condition Anatomy: No data recorded Boluses Administered: Boluses Administered Boluses Administered: Thin liquids (Level 0); Mildly thick liquids (Level 2, nectar thick); Moderately thick liquids (Level 3, honey thick); Puree  Oral Impairment Domain: Oral Impairment  Domain Lip Closure: Escape beyond mid-chin Tongue control during bolus hold: Not tested Bolus transport/lingual motion: Delayed initiation of tongue motion (oral holding) Oral residue: Residue collection on oral structures Location of oral residue : Tongue; Lateral sulci; Floor of mouth Initiation of pharyngeal swallow : Pyriform sinuses  Pharyngeal Impairment Domain: Pharyngeal Impairment Domain Pharyngeal residue: Collection of residue within or on pharyngeal structures Location of pharyngeal residue: Pyriform sinuses;  Tongue base  Esophageal Impairment Domain: No data recorded Pill: No data recorded Penetration/Aspiration Scale Score: No data recorded Compensatory Strategies: No data recorded  General Information: No data recorded Diet Prior to this Study: Dysphagia 1 (pureed); Moderately thick liquids (Level 3, honey thick)   Temperature : Normal   Respiratory Status: WFL   Supplemental O2: Nasal cannula   History of Recent Intubation: No  Behavior/Cognition: Alert; Cooperative Self-Feeding Abilities: Needs assist with self-feeding Baseline vocal quality/speech: Not observed Volitional Cough: -- (weak congested) Volitional Swallow: Able to elicit No data recorded Goal Planning: No data recorded No data recorded No data recorded No data recorded No data recorded Pain: Pain Assessment Pain Assessment: Faces Pain Location: B LE with rolling for pericare Pain Descriptors / Indicators: Grimacing; Guarding; Moaning Pain Intervention(s): Limited activity within patient's tolerance; Monitored during session; Repositioned End of Session: Start Time:SLP Start Time (ACUTE ONLY): 1200 Stop Time: SLP Stop Time (ACUTE ONLY): 1235 Time Calculation:SLP Time Calculation (min) (ACUTE ONLY): 35 min Charges: SLP Evaluations $ SLP Speech Visit: 1 Visit SLP Evaluations $MBS Swallow: 1 Procedure $Swallowing Treatment: 1 Procedure SLP visit diagnosis: SLP Visit Diagnosis: Dysphagia, oropharyngeal phase (R13.12) Past Medical History:  Past Medical History: Diagnosis Date  Auditory hallucinations 11/17/2020  History of kidney stones   Hypertension  Past Surgical History: Past Surgical History: Procedure Laterality Date  KNEE SURGERY    10-15 years ago  ORIF FEMUR FRACTURE Right 03/05/2023  Procedure: OPEN REDUCTION INTERNAL FIXATION (ORIF) DISTAL FEMUR FRACTURE;  Surgeon: Roby Lofts, MD;  Location: [facility] OR;  Service: Orthopedics;  Laterality: Right; [place], Riley Nearing 03/15/2023, 10:10 AM  DG CHEST PORT 1 VIEW Result Date: 03/14/2023 CLINICAL DATA:  Shortness of breath EXAM: PORTABLE CHEST 1 VIEW COMPARISON:  03/09/2023, 03/08/2023 FINDINGS: Enteric tube tip below the diaphragm but incompletely assessed. Cardiomegaly with slightly improved aeration of left base. Residual vascular congestion. Aortic atherosclerosis. IMPRESSION: Cardiomegaly with mild vascular congestion. Slightly improved aeration of left base. Electronically Signed   By: Jasmine Pang M.D.   On: 03/14/2023 23:54    Scheduled Meds:  acetaminophen  650 mg Per Tube Q6H   amLODipine  5 mg Per Tube Daily   ascorbic acid  500 mg Per Tube BID   dorzolamide-timolol  1 drop Both Eyes BID   enoxaparin (LOVENOX) injection  40 mg Subcutaneous Q24H   escitalopram  10 mg Per Tube Daily   feeding supplement (PROSource TF20)  60 mL Per Tube BID   fiber supplement (BANATROL TF)  60 mL Per Tube BID   free water  150 mL Per Tube Q4H   furosemide  40 mg Intravenous BID   Gerhardt's butt cream   Topical BID   memantine  10 mg Per Tube BID   multivitamin with minerals  1 tablet Per Tube Daily   oxybutynin  5 mg Per Tube TID   pneumococcal 20-valent conjugate vaccine  0.5 mL Intramuscular Tomorrow-1000   zinc sulfate (50mg  elemental zinc)  220 mg Per Tube Daily   Continuous Infusions:  feeding supplement (OSMOLITE 1.5 CAL) 50 mL/hr at 03/15/23 1459     LOS: 12 days   Hughie Closs, MD [facility]  03/16/2023, 7:27 AM   *Please note that this is a verbal  dictation therefore any spelling or grammatical errors are due to the "[employer]agon [employer] One" system interpretation.  Please page via Amion and do not message via secure chat for urgent patient care matters. Secure chat can be used for non urgent  patient care matters.  How to contact the [facility] Attending or Consulting provider 7A - 7P or covering provider during after hours 7P -7A, for this patient?  Check the care team in [facility] and look for a) attending/consulting TRH provider listed and b) the [facility] team listed. Page or secure chat 7A-7P. Log into [email or web address] and use [place]'s universal password to access. If you do not have the password, please contact the hospital operator. Locate the [facility] provider you are looking for under [facility] and page to a number that you can be directly reached. If you still have difficulty reaching the provider, please page the [facility] (Director on Call) for the Hospitalists listed on amion for assistance.

## 2023-03-16 NOTE — TOC Progression Note (Signed)
 Transition of Care [facility]) - Progression Note    Patient Details  Name: Sharon Ramos MRN: [id number] Date of Birth: 1940-01-05  Transition of Care [facility]) CM/SW Contact  Graves-Bigelow, Lamar Laundry, RN Phone Number: 03/16/2023, 10:07 AM  Clinical Narrative: Patient was discussed in progression rounds- tube feeds continue via [place]-diet advanced to dysphagia 1 with honey thick liquids. Disposition plan continues to be home with home health with [facility] Patient will need orders for RN/PT/OT/Aide Services & F2F. DME wheelchair and bedside commode have been delivered; hospital bed and hoyer lift still needs to be delivered to the home. No further needs identified at this time. Case Manager will continue to follow for additional needs.  Expected Discharge Plan: Home w Home Health Services Barriers to Discharge: Continued Medical Work up, SNF Pending bed offer  Expected Discharge Plan and Services In-house Referral: Clinical Social Work Discharge Planning Services: CM Consult Post Acute Care Choice: Home Health, Durable Medical Equipment Living arrangements for the past 2 months: Single Family Home                 DME Arranged: Hospital bed, High strength lightweight manual wheelchair with seat cushion, Bedside commode (hoyer lift) DME Agency: [employer] Date DME Agency Contacted: 03/12/23 Time DME Agency Contacted: [phone] Representative spoke with at DME Agency: Vaughan Basta HH Arranged: PT, OT, Nurse's Aide HH Agency: [facility] Date [facility] Agency Contacted: 03/12/23 Time HH Agency Contacted: [phone] Representative spoke with at [facility] Agency: Kandee Keen  Social Determinants of Health (SDOH) Interventions SDOH Screenings   Food Insecurity: No Food Insecurity (03/04/2023)  Housing: Low Risk  (03/04/2023)  Transportation Needs: No Transportation Needs (03/04/2023)  Utilities: Not At Risk (03/04/2023)  Social Connections: Moderately Isolated (03/04/2023)  Tobacco Use: Low Risk   (03/05/2023)    Readmission Risk Interventions     No data to display

## 2023-03-16 NOTE — Plan of Care (Signed)
  Problem: Education: Goal: Knowledge of General Education information will improve Description: Including pain rating scale, medication(s)/side effects and non-pharmacologic comfort measures Outcome: Progressing   Problem: Health Behavior/Discharge Planning: Goal: Ability to manage health-related needs will improve Outcome: Progressing   Problem: Clinical Measurements: Goal: Ability to maintain clinical measurements within normal limits will improve Outcome: Progressing Goal: Will remain free from infection Outcome: Progressing Goal: Diagnostic test results will improve Outcome: Progressing Goal: Respiratory complications will improve Outcome: Progressing Goal: Cardiovascular complication will be avoided Outcome: Progressing   Problem: Activity: Goal: Risk for activity intolerance will decrease Outcome: Progressing   Problem: Nutrition: Goal: Adequate nutrition will be maintained Outcome: Progressing   Problem: Coping: Goal: Level of anxiety will decrease Outcome: Progressing   Problem: Elimination: Goal: Will not experience complications related to bowel motility Outcome: Progressing Goal: Will not experience complications related to urinary retention Outcome: Progressing   Problem: Pain Managment: Goal: General experience of comfort will improve and/or be controlled Outcome: Progressing   Problem: Safety: Goal: Ability to remain free from injury will improve Outcome: Progressing   Problem: Skin Integrity: Goal: Risk for impaired skin integrity will decrease Outcome: Progressing   Problem: Activity: Goal: Ability to tolerate increased activity will improve Outcome: Progressing   Problem: Clinical Measurements: Goal: Ability to maintain a body temperature in the normal range will improve Outcome: Progressing   Problem: Respiratory: Goal: Ability to maintain adequate ventilation will improve Outcome: Progressing Goal: Ability to maintain a clear airway  will improve Outcome: Progressing   Problem: Fluid Volume: Goal: Hemodynamic stability will improve Outcome: Progressing   Problem: Clinical Measurements: Goal: Diagnostic test results will improve Outcome: Progressing Goal: Signs and symptoms of infection will decrease Outcome: Progressing   Problem: Respiratory: Goal: Ability to maintain adequate ventilation will improve Outcome: Progressing   Problem: Fluid Volume: Goal: Hemodynamic stability will improve Outcome: Progressing   Problem: Clinical Measurements: Goal: Diagnostic test results will improve Outcome: Progressing Goal: Signs and symptoms of infection will decrease Outcome: Progressing   Problem: Respiratory: Goal: Ability to maintain adequate ventilation will improve Outcome: Progressing   Problem: Activity: Goal: Ability to tolerate increased activity will improve Outcome: Progressing   Problem: Clinical Measurements: Goal: Ability to maintain a body temperature in the normal range will improve Outcome: Progressing   Problem: Respiratory: Goal: Ability to maintain adequate ventilation will improve Outcome: Progressing Goal: Ability to maintain a clear airway will improve Outcome: Progressing

## 2023-03-17 ENCOUNTER — Inpatient Hospital Stay (HOSPITAL_COMMUNITY)

## 2023-03-17 DIAGNOSIS — S72401A Unspecified fracture of lower end of right femur, initial encounter for closed fracture: Secondary | ICD-10-CM | POA: Diagnosis not present

## 2023-03-17 LAB — BLOOD GAS, ARTERIAL
Acid-Base Excess: 3.7 mmol/L — ABNORMAL HIGH (ref 0.0–2.0)
Bicarbonate: 27.7 mmol/L (ref 20.0–28.0)
Drawn by: 252031
O2 Saturation: 99.7 %
Patient temperature: 37
pCO2 arterial: 39 mmHg (ref 32–48)
pH, Arterial: 7.46 — ABNORMAL HIGH (ref 7.35–7.45)
pO2, Arterial: 114 mmHg — ABNORMAL HIGH (ref 83–108)

## 2023-03-17 LAB — BASIC METABOLIC PANEL
Anion gap: 12 (ref 5–15)
BUN: 100 mg/dL — ABNORMAL HIGH (ref 8–23)
CO2: 23 mmol/L (ref 22–32)
Calcium: 8.3 mg/dL — ABNORMAL LOW (ref 8.9–10.3)
Chloride: 102 mmol/L (ref 98–111)
Creatinine, Ser: 1.98 mg/dL — ABNORMAL HIGH (ref 0.44–1.00)
GFR, Estimated: 25 mL/min — ABNORMAL LOW (ref >60)
Glucose, Bld: 120 mg/dL — ABNORMAL HIGH (ref 70–99)
Potassium: 4.9 mmol/L (ref 3.5–5.1)
Sodium: 137 mmol/L (ref 135–145)

## 2023-03-17 LAB — GLUCOSE, CAPILLARY
Glucose-Capillary: 100 mg/dL — ABNORMAL HIGH (ref 70–99)
Glucose-Capillary: 114 mg/dL — ABNORMAL HIGH (ref 70–99)
Glucose-Capillary: 115 mg/dL — ABNORMAL HIGH (ref 70–99)
Glucose-Capillary: 115 mg/dL — ABNORMAL HIGH (ref 70–99)
Glucose-Capillary: 123 mg/dL — ABNORMAL HIGH (ref 70–99)
Glucose-Capillary: 99 mg/dL (ref 70–99)

## 2023-03-17 LAB — LACTIC ACID, PLASMA: Lactic Acid, Venous: 1.5 mmol/L (ref 0.5–1.9)

## 2023-03-17 LAB — MAGNESIUM: Magnesium: 2 mg/dL (ref 1.7–2.4)

## 2023-03-17 MED ORDER — ZINC SULFATE 220 (50 ZN) MG PO CAPS
220.0000 mg | ORAL_CAPSULE | Freq: Every day | ORAL | Status: AC
  Administered 2023-03-17 – 2023-03-22 (×6): 220 mg via ORAL
  Filled 2023-03-17 (×6): qty 1

## 2023-03-17 MED ORDER — ADULT MULTIVITAMIN W/MINERALS CH
1.0000 | ORAL_TABLET | Freq: Every day | ORAL | Status: DC
  Administered 2023-03-17 – 2023-03-23 (×7): 1 via ORAL
  Filled 2023-03-17 (×7): qty 1

## 2023-03-17 MED ORDER — FUROSEMIDE 10 MG/ML IJ SOLN
40.0000 mg | Freq: Once | INTRAMUSCULAR | Status: AC
Start: 2023-03-17 — End: 2023-03-17
  Administered 2023-03-17: 40 mg via INTRAVENOUS

## 2023-03-17 MED ORDER — HYDRALAZINE HCL 10 MG PO TABS: 10.0000 mg | ORAL_TABLET | Freq: Four times a day (QID) | ORAL | Status: DC | PRN

## 2023-03-17 MED ORDER — OXYBUTYNIN CHLORIDE 5 MG PO TABS
5.0000 mg | ORAL_TABLET | Freq: Three times a day (TID) | ORAL | Status: DC
  Administered 2023-03-17 – 2023-03-23 (×19): 5 mg via ORAL
  Filled 2023-03-17 (×21): qty 1

## 2023-03-17 MED ORDER — MEMANTINE HCL 10 MG PO TABS
10.0000 mg | ORAL_TABLET | Freq: Two times a day (BID) | ORAL | Status: DC
  Administered 2023-03-17 – 2023-03-23 (×12): 10 mg via ORAL
  Filled 2023-03-17 (×12): qty 1

## 2023-03-17 MED ORDER — AMLODIPINE BESYLATE 5 MG PO TABS
5.0000 mg | ORAL_TABLET | Freq: Every day | ORAL | Status: DC
  Administered 2023-03-17: 5 mg via ORAL
  Filled 2023-03-17: qty 1

## 2023-03-17 MED ORDER — VITAMIN C 500 MG PO TABS
500.0000 mg | ORAL_TABLET | Freq: Two times a day (BID) | ORAL | Status: DC
  Administered 2023-03-17 – 2023-03-23 (×12): 500 mg via ORAL
  Filled 2023-03-17 (×12): qty 1

## 2023-03-17 MED ORDER — ACETAMINOPHEN 325 MG PO TABS
650.0000 mg | ORAL_TABLET | Freq: Four times a day (QID) | ORAL | Status: DC
  Administered 2023-03-17 – 2023-03-23 (×17): 650 mg via ORAL
  Filled 2023-03-17 (×21): qty 2

## 2023-03-17 MED ORDER — ESCITALOPRAM OXALATE 10 MG PO TABS
10.0000 mg | ORAL_TABLET | Freq: Every day | ORAL | Status: DC
  Administered 2023-03-17 – 2023-03-23 (×7): 10 mg via ORAL
  Filled 2023-03-17 (×7): qty 1

## 2023-03-17 NOTE — Significant Event (Signed)
 Rapid Response Event Note   Reason for Call :  Increased work of breathing.   Initial Focused Assessment:  On arrival, pt sitting in bed with labored breathing on NRB. Lungs with rhonchi throughout. She was also noted to have runs of SVT while in the room assessing her.   HR 64, BP 103/52, RR 30, spO2 100% on NRB   Interventions:  CXR Labs- BMP, Mag, Lactic Additional dose of Lasix 40mg    Plan of Care:  Continue to monitor pt status. RN to call with any changes or concerns.    Event Summary:   MD Notified: Dr Jacqulyn Bath notified by primary RN Call Time: 1758 Arrival Time: 1802 End Time: 1840  Mordecai Rasmussen, RN

## 2023-03-17 NOTE — Progress Notes (Signed)
 PROGRESS NOTE    Sharon Ramos  YQI:[id number] DOB: July 03, 1940 DOA: 03/04/2023 PCP: Theodis Shove, DO   Brief Narrative:  83 y.o. 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  CT of the knee showed comminuted periprosthetic distal femur fracture, orthopedic surgery was consulted, later did not  recommended surgical intervention.   Assessment & Plan:   Principal Problem:   Closed fracture of right distal femur (HCC) Active Problems:   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)   Anemia of chronic disease   Chronic kidney disease, stage 4 (severe) (HCC)   Generalized anxiety disorder   Mutism   Sensorineural hearing loss (SNHL) of both ears   Severe obesity (HCC)  Closed fracture of right distal femur (HCC) Secondary to mechanical falls. Orthopedic surgery was consulted recommended status post surgical intervention on 03/06/2023 narcotics per BTK surgery. Analgesics and DVT prophylaxis per orthopedic surgery, Eliquis twice a day for 30 days. Continue with analgesia as needed  Acute respiratory failure with hypoxia due to pulmonary edema/acute on chronic diastolic congestive heart failure: On the night of 03/14/2023, patient was morning, she was given Dilaudid, post pain medication, patient started having shortness of breath and became hypoxic requiring 6 L of oxygen.  Chest x-ray was obtained which showed vascular congestion.  No infiltrates.  Patient was weaned down to 2 to 3 L.  No known history of congestive heart failure, no echo in chart.  Ordered echo here which shows normal ejection fraction and grade 2 diastolic dysfunction.  Started on Lasix 40 mg IV twice daily for total of 4 doses.  Hypoxia resolved.     Acute metabolic  encephalopathy: Likely secondary to below sepsis with PNA.  Held Robaxin and melatonin. CT of the head reviewed, neg Mentation now much improved, conversing and following commands   Sepsis secondary to aspiration pneumonia, not present on admission:  On 3/6, p t had increased lethargy, with fevers up to 101.64F as of 3/6 and tachypnea Ordered and reviewed CXR, findings of L>R patchy basilar airspace disease with repeat CXR demonstrating worse changes. SARS-CoV-2 influenza and RSV PCR negative.  Patient improved on azithromycin and Rocephin and she has completed the course as well.   Essential hypertension: Blood pressure controlled, cont on norvasc as tolerated   New acute kidney injury on chronic kidney disease stage IIIb-patient does not have CKD stage IV: Last creatinine in chart is from December 2022 which was 2.09 with GFR of 24.  However prior to that, creatinine was around 1.5-1.7 making it CKD stage IIIb.  Patient's creatinine upon arrival was at baseline but rose and peaked at 3.19 on 03/06/2023.  Creatinine improved and currently 1.9. .  Avoid nephrotoxic agents.  Check every other day.   Anxiety/delusion/auditory elucidation/Alzheimer's dementia: Continue Lexapro and Namenda.   OAB: Continue oxybutynin.   Morbid obesity: Weight loss and diet modification counseled.   Hypernatremia Resolved.  Sacral decubitus ulcer present on admission stage II: RN Pressure Injury Documentation:        Pressure Injury 03/04/23 Other (Comment) Medial Stage 2 -  Partial thickness loss of dermis presenting as a shallow open injury with a red, pink wound bed without slough. (Active)  03/04/23 2000  Location: Other (Comment)  Location Orientation: Medial  Staging: Stage 2 -  Partial thickness loss of  dermis presenting as a shallow open injury with a red, pink wound bed without slough.  Wound Description (Comments):   Present on Admission: Yes  Dressing Type Foam - Lift dressing to assess site  every shift 03/06/23 1930    Acute blood loss anemia -Likely secondary to recent surgery -improved after 1 unit PRBC transfusion.  Currently stable around 8.   FEN/dysphagia: -Patient maintained on tube feeds, SLP following, started on dysphagia 1 diet 03/13/2023.  Slow weaning from tube feeds per dietitian and SLP.  Underwent MBS 03/14/2023.  Tube feed changed to nocturnal only on 03/16/2023.  DVT prophylaxis: enoxaparin (LOVENOX) injection 40 mg Start: 03/10/23 1600 SCDs Start: 03/05/23 1647 SCDs Start: 03/04/23 1751   Code Status: Full Code  Family Communication: Daughter present at bedside.   Status is: Inpatient: still tube feed dependent   Estimated body mass index is 38.94 kg/m as calculated from the following:   Height as of this encounter: 5\' 9"  (1.753 m).   Weight as of this encounter: 119.6 kg.  Pressure Injury 03/04/23 Other (Comment) Medial Stage 2 -  Partial thickness loss of dermis presenting as a shallow open injury with a red, pink wound bed without slough. (Active)  03/04/23 2000  Location: Other (Comment) (intergluteal cleft)  Location Orientation: Medial  Staging: Stage 2 -  Partial thickness loss of dermis presenting as a shallow open injury with a red, pink wound bed without slough.  Wound Description (Comments):   Present on Admission: Yes  Dressing Type Foam - Lift dressing to assess site every shift 03/17/23 0744   Nutritional Assessment: Body mass index is 38.94 kg/m.Marland Kitchen Seen by dietician.  I agree with the assessment and plan as outlined below: Nutrition Status: Nutrition Problem: Inadequate oral intake Etiology: inability to eat, dysphagia Signs/Symptoms: NPO status Interventions: Tube feeding  . Skin Assessment: I have examined the patient's skin and I agree with the wound assessment as performed by the wound care RN as outlined below: Pressure Injury 03/04/23 Other (Comment) Medial Stage 2 -  Partial thickness loss of dermis presenting as a  shallow open injury with a red, pink wound bed without slough. (Active)  03/04/23 2000  Location: Other (Comment) (intergluteal cleft)  Location Orientation: Medial  Staging: Stage 2 -  Partial thickness loss of dermis presenting as a shallow open injury with a red, pink wound bed without slough.  Wound Description (Comments):   Present on Admission: Yes  Dressing Type Foam - Lift dressing to assess site every shift 03/17/23 0744    Consultants:  Orthopedics  Procedures:  As above  Antimicrobials:  Anti-infectives (From admission, onward)    Start     Dose/Rate Route Frequency Ordered Stop   03/12/23 1215  azithromycin (ZITHROMAX) 500 mg in sodium chloride 0.9 % 250 mL IVPB  Status:  Discontinued        500 mg 250 mL/hr over 60 Minutes Intravenous  Once 03/12/23 1115 03/12/23 1116   03/08/23 1400  cefTRIAXone (ROCEPHIN) 2 g in sodium chloride 0.9 % 100 mL IVPB        2 g 200 mL/hr over 30 Minutes Intravenous Daily 03/08/23 1333 03/12/23 1100   03/08/23 1400  azithromycin (ZITHROMAX) 500 mg in sodium chloride 0.9 % 250 mL IVPB        500 mg 250 mL/hr over 60 Minutes Intravenous Daily 03/08/23 1333 03/12/23 1400   03/05/23 2100  ceFAZolin (ANCEF) IVPB 2g/100 mL premix        2 g 200  mL/hr over 30 Minutes Intravenous Every 8 hours 03/05/23 1646 03/06/23 1552   03/05/23 1317  vancomycin (VANCOCIN) powder  Status:  Discontinued          As needed 03/05/23 1317 03/05/23 1426   03/05/23 1030  ceFAZolin (ANCEF) IVPB 2g/100 mL premix        2 g 200 mL/hr over 30 Minutes Intravenous On call to O.R. 03/05/23 0937 03/05/23 1310         Subjective: Seen and examined.  Much more awake and alert compared to yesterday.  No complaints.  Appears comfortable.  Objective: Vitals:   03/16/23 0500 03/16/23 1131 03/16/23 2029 03/17/23 0437  BP:  (!) 118/51 (!) 121/54 (!) 112/41  Pulse:  67  65  Resp:  16 16 18   Temp:  97.9 F (36.6 C) 97.6 F (36.4 C) 98.8 F (37.1 C)  TempSrc:  Oral  Oral Axillary  SpO2:  100% 98% 95%  Weight: 123.8 kg   119.6 kg  Height:        Intake/Output Summary (Last 24 hours) at 03/17/2023 0827 Last data filed at 03/17/2023 0744 Gross per 24 hour  Intake 2279.42 ml  Output 2100 ml  Net 179.42 ml   Filed Weights   03/14/23 0500 03/16/23 0500 03/17/23 0437  Weight: 112 kg 123.8 kg 119.6 kg    Examination:  General exam: Appears calm and comfortable  Respiratory system: Clear to auscultation. Respiratory effort normal. Cardiovascular system: S1 & S2 heard, RRR. No JVD, murmurs, rubs, gallops or clicks.  +1 pitting edema bilateral lower extremity. Gastrointestinal system: Abdomen is nondistended, soft and nontender. No organomegaly or masses felt. Normal bowel sounds heard. Central nervous system: Alert. No focal neurological deficits. Extremities: Symmetric 5 x 5 power. Skin: No rashes, lesions or ulcers.    Data Reviewed: I have personally reviewed following labs and imaging studies  CBC: Recent Labs  Lab 03/11/23 0513 03/12/23 0528 03/13/23 0436 03/14/23 0408 03/16/23 0605  WBC 9.2 10.3 11.2* 13.0* 13.5*  NEUTROABS  --   --   --   --  9.0*  HGB 7.5* 7.9* 8.1* 8.2* 8.3*  HCT 24.2* 26.1* 27.2* 27.2* 26.4*  MCV 92.4 93.9 96.5 95.1 93.3  PLT 228 275 334 312 322   Basic Metabolic Panel: Recent Labs  Lab 03/10/23 1639 03/11/23 0513 03/12/23 0528 03/13/23 0436 03/14/23 0408 03/15/23 0414 03/16/23 0605  NA  --  148* 151* 151* 150* 140 137  K  --  3.8 4.0 3.9 4.2 4.2 4.5  CL  --  119* 121* 118* 116* 107 107  CO2  --  22 22 22 26 24 25   GLUCOSE  --  146* 108* 114* 117* 113* 109*  BUN  --  51* 51* 53* 59* 62* 76*  CREATININE  --  1.59* 1.50* 1.48* 1.60* 1.66* 1.87*  CALCIUM  --  7.7* 8.1* 8.4* 8.6* 8.2* 8.3*  MG 2.3 2.2  --   --   --   --   --   PHOS 2.2* 2.0*  --   --   --   --   --    GFR: Estimated Creatinine Clearance: 32.1 mL/min (A) (by C-G formula based on SCr of 1.87 mg/dL (H)). Liver Function Tests: Recent  Labs  Lab 03/10/23 1316 03/11/23 0513 03/12/23 0528 03/13/23 0436 03/14/23 0408  AST 42* 35 30 27 27   ALT <5 <5 <5 <5 7  ALKPHOS 35* 35* 34* 43 41  BILITOT 0.5 0.7 0.6 0.6  0.6  PROT 5.2* 5.0* 5.4* 5.4* 5.6*  ALBUMIN 2.0* 2.0* 2.2* 2.1* 2.2*   No results for input(s): "LIPASE", "AMYLASE" in the last 168 hours. No results for input(s): "AMMONIA" in the last 168 hours. Coagulation Profile: No results for input(s): "INR", "PROTIME" in the last 168 hours. Cardiac Enzymes: No results for input(s): "CKTOTAL", "CKMB", "CKMBINDEX", "TROPONINI" in the last 168 hours. BNP (last 3 results) No results for input(s): "PROBNP" in the last 8760 hours. HbA1C: No results for input(s): "HGBA1C" in the last 72 hours. CBG: Recent Labs  Lab 03/16/23 1611 03/16/23 2025 03/17/23 0009 03/17/23 0434 03/17/23 0727  GLUCAP 102* 117* 123* 115* 100*   Lipid Profile: No results for input(s): "CHOL", "HDL", "LDLCALC", "TRIG", "CHOLHDL", "LDLDIRECT" in the last 72 hours. Thyroid Function Tests: No results for input(s): "TSH", "T4TOTAL", "FREET4", "T3FREE", "THYROIDAB" in the last 72 hours. Anemia Panel: No results for input(s): "VITAMINB12", "FOLATE", "FERRITIN", "TIBC", "IRON", "RETICCTPCT" in the last 72 hours. Sepsis Labs: No results for input(s): "PROCALCITON", "LATICACIDVEN" in the last 168 hours.   Recent Results (from the past 240 hours)  Culture, blood (Routine X 2) w Reflex to ID Panel     Status: None   Collection Time: 03/08/23  2:09 PM   Specimen: BLOOD RIGHT ARM  Result Value Ref Range Status   Specimen Description BLOOD RIGHT ARM  Final   Special Requests   Final    BOTTLES DRAWN AEROBIC AND ANAEROBIC Blood Culture results may not be optimal due to an inadequate volume of blood received in culture bottles   Culture   Final    NO GROWTH 5 DAYS Performed at [facility], [address], [place], [place] [address]    Report Status 03/13/2023 FINAL  Final  Culture, blood  (Routine X 2) w Reflex to ID Panel     Status: None   Collection Time: 03/08/23  2:09 PM   Specimen: BLOOD RIGHT ARM  Result Value Ref Range Status   Specimen Description BLOOD RIGHT ARM  Final   Special Requests   Final    BOTTLES DRAWN AEROBIC AND ANAEROBIC Blood Culture results may not be optimal due to an inadequate volume of blood received in culture bottles   Culture   Final    NO GROWTH 5 DAYS Performed at [facility], [address] [place], [place] [address]    Report Status 03/13/2023 FINAL  Final     Radiology Studies: ECHOCARDIOGRAM COMPLETE Result Date: 03/15/2023    ECHOCARDIOGRAM REPORT   Patient Name:   Sharon Ramos Date of Exam: 03/15/2023 Medical Rec #:  [id number]          Height:       69.0 in Accession #:    [id number]         Weight:       246.9 lb Date of Birth:  1940-02-26          BSA:          2.260 m Patient Age:    82 years           BP:           128/54 mmHg Patient Gender: F                  HR:           68 bpm. Exam Location:  Inpatient Procedure: 2D Echo, Cardiac Doppler and Color Doppler (Both Spectral and Color  Flow Doppler were utilized during procedure). Indications:    CHF  History:        Patient has no prior history of Echocardiogram examinations.  Sonographer:    Amy Chionchio Referring Phys: [id number] Aliviya Schoeller IMPRESSIONS  1. Left ventricular ejection fraction, by estimation, is 60 to 65%. The left ventricle has normal function. The left ventricle has no regional wall motion abnormalities. There is mild left ventricular hypertrophy of the basal-septal segment. Left ventricular diastolic parameters are consistent with Grade II diastolic dysfunction (pseudonormalization). Elevated left ventricular end-diastolic pressure.  2. Right ventricular systolic function is normal. The right ventricular size is mildly enlarged.  3. Left atrial size was severely dilated.  4. The mitral valve is normal in structure. No evidence of mitral valve  regurgitation. No evidence of mitral stenosis.  5. The aortic valve is tricuspid. Aortic valve regurgitation is not visualized. Aortic valve sclerosis/calcification is present, without any evidence of aortic stenosis. Aortic valve area, by VTI measures 2.17 cm. Aortic valve mean gradient measures 7.0 mmHg. Aortic valve Vmax measures 1.60 m/s.  6. The inferior vena cava is normal in size with greater than 50% respiratory variability, suggesting right atrial pressure of 3 mmHg. FINDINGS  Left Ventricle: Left ventricular ejection fraction, by estimation, is 60 to 65%. The left ventricle has normal function. The left ventricle has no regional wall motion abnormalities. The left ventricular internal cavity size was normal in size. There is  mild left ventricular hypertrophy of the basal-septal segment. Left ventricular diastolic parameters are consistent with Grade II diastolic dysfunction (pseudonormalization). Elevated left ventricular end-diastolic pressure. Right Ventricle: The right ventricular size is mildly enlarged. No increase in right ventricular wall thickness. Right ventricular systolic function is normal. Left Atrium: Left atrial size was severely dilated. Right Atrium: Right atrial size was normal in size. Pericardium: There is no evidence of pericardial effusion. Mitral Valve: The mitral valve is normal in structure. No evidence of mitral valve regurgitation. No evidence of mitral valve stenosis. MV peak gradient, 5.8 mmHg. The mean mitral valve gradient is 2.0 mmHg. Tricuspid Valve: The tricuspid valve is normal in structure. Tricuspid valve regurgitation is trivial. No evidence of tricuspid stenosis. Aortic Valve: The aortic valve is tricuspid. Aortic valve regurgitation is not visualized. Aortic valve sclerosis/calcification is present, without any evidence of aortic stenosis. Aortic valve mean gradient measures 7.0 mmHg. Aortic valve peak gradient measures 10.2 mmHg. Aortic valve area, by VTI measures  2.17 cm. Pulmonic Valve: The pulmonic valve was normal in structure. Pulmonic valve regurgitation is not visualized. No evidence of pulmonic stenosis. Aorta: The aortic root is normal in size and structure. Venous: The inferior vena cava is normal in size with greater than 50% respiratory variability, suggesting right atrial pressure of 3 mmHg. IAS/Shunts: No atrial level shunt detected by color flow Doppler.  LEFT VENTRICLE PLAX 2D LVIDd:         4.70 cm      Diastology LVIDs:         3.20 cm      LV e' medial:    6.42 cm/s LV PW:         1.00 cm      LV E/e' medial:  15.7 LV IVS:        1.10 cm      LV e' lateral:   7.94 cm/s LVOT diam:     2.10 cm      LV E/e' lateral: 12.7 LV SV:  77 LV SV Index:   34 LVOT Area:     3.46 cm  LV Volumes (MOD) LV vol d, MOD A2C: 127.0 ml LV vol d, MOD A4C: 110.0 ml LV vol s, MOD A2C: 41.6 ml LV vol s, MOD A4C: 41.4 ml LV SV MOD A2C:     85.4 ml LV SV MOD A4C:     110.0 ml LV SV MOD BP:      78.4 ml RIGHT VENTRICLE RV Basal diam:  4.00 cm RV Mid diam:    3.70 cm TAPSE (M-mode): 1.7 cm LEFT ATRIUM            Index        RIGHT ATRIUM           Index LA Vol (A4C): 114.0 ml 50.45 ml/m  RA Area:     24.70 cm                                     RA Volume:   71.80 ml  31.78 ml/m  AORTIC VALVE                     PULMONIC VALVE AV Area (Vmax):    2.47 cm      PV Vmax:       0.94 m/s AV Area (Vmean):   2.10 cm      PV Peak grad:  3.6 mmHg AV Area (VTI):     2.17 cm AV Vmax:           160.00 cm/s AV Vmean:          122.000 cm/s AV VTI:            0.353 m AV Peak Grad:      10.2 mmHg AV Mean Grad:      7.0 mmHg LVOT Vmax:         114.00 cm/s LVOT Vmean:        73.800 cm/s LVOT VTI:          0.221 m LVOT/AV VTI ratio: 0.63  AORTA Ao Root diam: 3.40 cm MITRAL VALVE                TRICUSPID VALVE MV Area (PHT): 3.20 cm     TR Peak grad:   42.5 mmHg MV Area VTI:   2.31 cm     TR Vmax:        326.00 cm/s MV Peak grad:  5.8 mmHg MV Mean grad:  2.0 mmHg     SHUNTS MV Vmax:        1.20 m/s     Systemic VTI:  0.22 m MV Vmean:      71.6 cm/s    Systemic Diam: 2.10 cm MV Decel Time: 237 msec MV E velocity: 101.00 cm/s MV A velocity: 97.10 cm/s MV E/A ratio:  1.04 Armanda Magic MD Electronically signed by Armanda Magic MD Signature Date/Time: 03/15/2023/1:55:07 PM    Final     Scheduled Meds:  acetaminophen  650 mg Oral Q6H   amLODipine  5 mg Oral Daily   ascorbic acid  500 mg Oral BID   dorzolamide-timolol  1 drop Both Eyes BID   enoxaparin (LOVENOX) injection  40 mg Subcutaneous Q24H   escitalopram  10 mg Oral Daily   feeding supplement (PROSource TF20)  60 mL Per Tube BID   fiber supplement (BANATROL TF)  60 mL Per Tube BID   free water  150 mL Per Tube Q4H   furosemide  40 mg Intravenous BID   Gerhardt's butt cream   Topical BID   memantine  10 mg Oral BID   multivitamin with minerals  1 tablet Oral Daily   oxybutynin  5 mg Oral TID   pneumococcal 20-valent conjugate vaccine  0.5 mL Intramuscular Tomorrow-1000   zinc sulfate (50mg  elemental zinc)  220 mg Oral Daily   Continuous Infusions:  feeding supplement (OSMOLITE 1.5 CAL) Stopped (03/17/23 0500)     LOS: 13 days   Hughie Closs, MD [facility]  03/17/2023, 8:27 AM   *Please note that this is a verbal dictation therefore any spelling or grammatical errors are due to the "Dragon [employer] One" system interpretation.  Please page via Amion and do not message via secure chat for urgent patient care matters. Secure chat can be used for non urgent patient care matters.  How to contact the [facility] Attending or Consulting provider 7A - 7P or covering provider during after hours 7P -7A, for this patient?  Check the care team in [facility] and look for a) attending/consulting [facility] provider listed and b) the [facility] team listed. Page or secure chat 7A-7P. Log into [email or web address] and use [facility] universal password to access. If you do not have the password, please contact the hospital operator. Locate the [facility] provider you  are looking for under [facility] and page to a number that you can be directly reached. If you still have difficulty reaching the provider, please page the [facility] (Director on Call) for the Hospitalists listed on amion for assistance.

## 2023-03-17 NOTE — Significant Event (Incomplete)
 Rapid Response Event Note   Reason for Call :  Labored breathing.   Initial Focused Assessment:  Pt lying in bed with eyes open. She is alert, deaf, does track and makes purposeful movements. Her breathing is tachypneic and labored. Lungs have crackles. Skin warm to touch.   T-101.3, HR-48, BP-91/41, RR-38, SpO2-100% on 10L HFNC  Interventions:  Tylenol Bipap EKG Plan of Care:  Bipap to help with WOB. Allow tylenol time to work. Please call RRT if further assistance needed.   Event Summary:   MD Notified: Dr. Loney Loh Call Time:2330 Arrival [phone] End Time:0003  Terrilyn Saver, RN

## 2023-03-17 NOTE — Significant Event (Signed)
 Rapid Response Event Note   Reason for Call :  Labored breathing.   Initial Focused Assessment:  Pt lying in bed with eyes open. She is alert, deaf, does track and makes purposeful movements. Her breathing is tachypneic and labored. Lungs have crackles. Skin warm to touch.   T-101.3, HR-48, BP-91/41, RR-38, SpO2-100% on 10L HFNC  Interventions:  Tylenol Bipap EKG-JR, Incomplete L BBB, T wave abnormality Plan of Care:  Bipap to help with WOB. Allow tylenol time to work. Please call RRT if further assistance needed.   Event Summary:   MD Notified: Dr. Loney Loh Call Time:2330 Arrival [phone] End Time:0003  Terrilyn Saver, RN

## 2023-03-17 NOTE — Plan of Care (Signed)
  Problem: Respiratory: Goal: Ability to maintain adequate ventilation will improve Outcome: Progressing Goal: Ability to maintain a clear airway will improve Outcome: Progressing   Problem: Activity: Goal: Ability to tolerate increased activity will improve Outcome: Progressing   Problem: Respiratory: Goal: Ability to maintain adequate ventilation will improve Outcome: Progressing Goal: Ability to maintain a clear airway will improve Outcome: Progressing

## 2023-03-17 NOTE — Progress Notes (Signed)
 Arterial gas drawn on 100% NRB

## 2023-03-18 DIAGNOSIS — S72401A Unspecified fracture of lower end of right femur, initial encounter for closed fracture: Secondary | ICD-10-CM | POA: Diagnosis not present

## 2023-03-18 LAB — CBC WITH DIFFERENTIAL/PLATELET
Abs Immature Granulocytes: 0.5 10*3/uL — ABNORMAL HIGH (ref 0.00–0.07)
Basophils Absolute: 0 10*3/uL (ref 0.0–0.1)
Basophils Relative: 0 %
Eosinophils Absolute: 1.1 10*3/uL — ABNORMAL HIGH (ref 0.0–0.5)
Eosinophils Relative: 5 %
HCT: 26.5 % — ABNORMAL LOW (ref 36.0–46.0)
Hemoglobin: 8.4 g/dL — ABNORMAL LOW (ref 12.0–15.0)
Lymphocytes Relative: 3 %
Lymphs Abs: 0.7 10*3/uL (ref 0.7–4.0)
MCH: 29.1 pg (ref 26.0–34.0)
MCHC: 31.7 g/dL (ref 30.0–36.0)
MCV: 91.7 fL (ref 80.0–100.0)
Monocytes Absolute: 0.7 10*3/uL (ref 0.1–1.0)
Monocytes Relative: 3 %
Myelocytes: 2 %
Neutro Abs: 19.8 10*3/uL — ABNORMAL HIGH (ref 1.7–7.7)
Neutrophils Relative %: 87 %
Platelets: 332 10*3/uL (ref 150–400)
RBC: 2.89 MIL/uL — ABNORMAL LOW (ref 3.87–5.11)
RDW: 19.8 % — ABNORMAL HIGH (ref 11.5–15.5)
WBC: 22.8 10*3/uL — ABNORMAL HIGH (ref 4.0–10.5)
nRBC: 0.4 % — ABNORMAL HIGH (ref 0.0–0.2)
nRBC: 1 /100{WBCs} — ABNORMAL HIGH

## 2023-03-18 LAB — BASIC METABOLIC PANEL
Anion gap: 15 (ref 5–15)
BUN: 102 mg/dL — ABNORMAL HIGH (ref 8–23)
CO2: 22 mmol/L (ref 22–32)
Calcium: 8.2 mg/dL — ABNORMAL LOW (ref 8.9–10.3)
Chloride: 104 mmol/L (ref 98–111)
Creatinine, Ser: 2.39 mg/dL — ABNORMAL HIGH (ref 0.44–1.00)
GFR, Estimated: 20 mL/min — ABNORMAL LOW (ref >60)
Glucose, Bld: 131 mg/dL — ABNORMAL HIGH (ref 70–99)
Potassium: 5.5 mmol/L — ABNORMAL HIGH (ref 3.5–5.1)
Sodium: 141 mmol/L (ref 135–145)

## 2023-03-18 LAB — TROPONIN I (HIGH SENSITIVITY)
Troponin I (High Sensitivity): 33 ng/L — ABNORMAL HIGH (ref <18)
Troponin I (High Sensitivity): 33 ng/L — ABNORMAL HIGH (ref <18)

## 2023-03-18 LAB — GLUCOSE, CAPILLARY
Glucose-Capillary: 104 mg/dL — ABNORMAL HIGH (ref 70–99)
Glucose-Capillary: 105 mg/dL — ABNORMAL HIGH (ref 70–99)
Glucose-Capillary: 114 mg/dL — ABNORMAL HIGH (ref 70–99)
Glucose-Capillary: 121 mg/dL — ABNORMAL HIGH (ref 70–99)
Glucose-Capillary: 128 mg/dL — ABNORMAL HIGH (ref 70–99)
Glucose-Capillary: 90 mg/dL (ref 70–99)
Glucose-Capillary: 95 mg/dL (ref 70–99)

## 2023-03-18 LAB — POTASSIUM: Potassium: 4.9 mmol/L (ref 3.5–5.1)

## 2023-03-18 LAB — PROCALCITONIN: Procalcitonin: 29.32 ng/mL

## 2023-03-18 LAB — TSH: TSH: 3.297 u[IU]/mL (ref 0.350–4.500)

## 2023-03-18 MED ORDER — DEXTROSE 50 % IV SOLN: 12.5000 g | INTRAVENOUS | Status: DC | PRN

## 2023-03-18 MED ORDER — SODIUM CHLORIDE 0.9 % IV SOLN
3.0000 g | Freq: Two times a day (BID) | INTRAVENOUS | Status: AC
  Administered 2023-03-18 – 2023-03-20 (×6): 3 g via INTRAVENOUS
  Filled 2023-03-18 (×6): qty 8

## 2023-03-18 MED ORDER — ENOXAPARIN SODIUM 30 MG/0.3ML IJ SOSY
30.0000 mg | PREFILLED_SYRINGE | INTRAMUSCULAR | Status: DC
  Administered 2023-03-18 – 2023-03-23 (×6): 30 mg via SUBCUTANEOUS
  Filled 2023-03-18 (×6): qty 0.3

## 2023-03-18 MED ORDER — SODIUM ZIRCONIUM CYCLOSILICATE 10 G PO PACK
10.0000 g | PACK | Freq: Two times a day (BID) | ORAL | Status: AC
  Administered 2023-03-18 (×2): 10 g via ORAL
  Filled 2023-03-18 (×2): qty 1

## 2023-03-18 NOTE — Progress Notes (Signed)
 Upon entering pt room, pt was on nasal cannula. Pt tolerating well at this time. RT to monitor as needed

## 2023-03-18 NOTE — Progress Notes (Addendum)
 PROGRESS NOTE    Sharon Ramos  QQV:[id number] DOB: [birth year] DOA: 03/04/2023 PCP: Theodis Shove, DO   Brief Narrative:  83 y.o. 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  CT of the knee showed comminuted periprosthetic distal femur fracture, orthopedic surgery was consulted, later did not  recommended surgical intervention.   Assessment & Plan:   Principal Problem:   Closed fracture of right distal femur (HCC) Active Problems:   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)   Anemia of chronic disease   Chronic kidney disease, stage 4 (severe) (HCC)   Generalized anxiety disorder   Mutism   Sensorineural hearing loss (SNHL) of both ears   Severe obesity (HCC)  Closed fracture of right distal femur (HCC) Secondary to mechanical falls. Orthopedic surgery was consulted recommended status post surgical intervention on 03/06/2023 narcotics per BTK surgery. Analgesics and DVT prophylaxis per orthopedic surgery, Eliquis twice a day for 30 days. Continue with analgesia as needed  Acute respiratory failure with hypoxia due to pulmonary edema/acute on chronic diastolic congestive heart failure/aspiration pneumonia: On the night of 03/14/2023, patient was moaning, she was given Dilaudid, post pain medication, patient started having shortness of breath and became hypoxic requiring 6 L of oxygen.  Chest x-ray showed vascular congestion.  No infiltrates.  Started on IV Lasix and eventually she was weaned to room air. Ordered echo which showed normal ejection fraction and grade 2 diastolic dysfunction.  However yet again on the late afternoon 03/17/2023, patient developed respiratory distress, requiring nonrebreather, rapid response was called.  Chest x-ray was  repeated, this once again showed some pulmonary edema but more so right lower lobe infiltrate.  I am checking procalcitonin however with patient having history of aspiration and on tube feeds, this is highly likely a true aspiration pneumonia so I will start her on Unasyn now.  Will wean oxygen as able to.  Procalcitonin 29.32.  No previous lab available.  We have kept her n.p.o.  I asked the nurse to reach out to SLP to reassess patient.   Acute metabolic encephalopathy: Likely secondary to below sepsis with PNA.  Held Robaxin and melatonin. CT of the head reviewed, neg Mentation now much improved, conversing and following commands   Sepsis secondary to aspiration pneumonia, not present on admission:  On 3/6, p t had increased lethargy, with fevers up to 101.57F as of 3/6 and tachypnea Ordered and reviewed CXR, findings of L>R patchy basilar airspace disease with repeat CXR demonstrating worse changes. SARS-CoV-2 influenza and RSV PCR negative.  Patient improved on azithromycin and Rocephin and she has completed the course as well.  However she developed pneumonia again on 03/17/2023, please see details above.   Essential hypertension: Blood pressure on the low side.  Will hold Norvasc for now.   New acute kidney injury on chronic kidney disease stage IIIb-patient does not have CKD stage IV: Last creatinine in chart is from December 2022 which was 2.09 with GFR of 24.  However prior to that, creatinine was around 1.5-1.7 making it CKD stage IIIb.  Patient's creatinine upon arrival was at baseline but rose and peaked at 3.19 on 03/06/2023.  Creatinine improved to 1.9 but rising again and 2.4 today.  Avoid nephrotoxic agents.  Will discontinue Lasix.  Hyperkalemia: 5.5.  Will give a dose  of Lokelma.  Recheck later.  Continue to monitor on telemetry.   Anxiety/delusion/auditory elucidation/Alzheimer's dementia: Continue Lexapro and Namenda.   OAB: Continue oxybutynin.   Morbid obesity: Weight  loss and diet modification counseled.   Hypernatremia Resolved.  Sacral decubitus ulcer present on admission stage II: RN Pressure Injury Documentation:        Pressure Injury 03/04/23 Other (Comment) Medial Stage 2 -  Partial thickness loss of dermis presenting as a shallow open injury with a red, pink wound bed without slough. (Active)  03/04/23 2000  Location: Other (Comment)  Location Orientation: Medial  Staging: Stage 2 -  Partial thickness loss of dermis presenting as a shallow open injury with a red, pink wound bed without slough.  Wound Description (Comments):   Present on Admission: Yes  Dressing Type Foam - Lift dressing to assess site every shift 03/06/23 1930    Acute blood loss anemia -Likely secondary to recent surgery -improved after 1 unit PRBC transfusion.  Currently stable around 8.   FEN/dysphagia: -Patient maintained on tube feeds, SLP following, started on dysphagia 1 diet 03/13/2023.  Slow weaning from tube feeds per dietitian and SLP.  Underwent MBS 03/14/2023.  Tube feed changed to nocturnal only on 03/16/2023.  GOC/poor prognosis: Due to patient's poor prognosis, I had lengthy discussion with patient's son at the bedside.  We discussed the difference between DNR and full code.  I recommended switching her to DNR but he wanted her to be full code.  I encouraged him to have frank discussion among the siblings and consider DNR status.  I also touched the topic of possible PEG tube placement if they want to continue to be aggressive because patient appears to have recurrent aspiration, I have consulted palliative care to have detailed GOC conversation with the patient's family.  DVT prophylaxis: enoxaparin (LOVENOX) injection 40 mg Start: 03/10/23 1600 SCDs Start: 03/05/23 1647 SCDs Start: 03/04/23 1751   Code Status: Full Code  Family Communication: Son present at the bedside.  Status is: Inpatient: still tube feed dependent and hypoxic with recurrent  pneumonia   Estimated body mass index is 38.48 kg/m as calculated from the following:   Height as of this encounter: 5\' 9"  (1.753 m).   Weight as of this encounter: 118.2 kg.  Pressure Injury 03/04/23 Other (Comment) Medial Stage 2 -  Partial thickness loss of dermis presenting as a shallow open injury with a red, pink wound bed without slough. (Active)  03/04/23 2000  Location: Other (Comment) (intergluteal cleft)  Location Orientation: Medial  Staging: Stage 2 -  Partial thickness loss of dermis presenting as a shallow open injury with a red, pink wound bed without slough.  Wound Description (Comments):   Present on Admission: Yes  Dressing Type Foam - Lift dressing to assess site every shift 03/18/23 0516   Nutritional Assessment: Body mass index is 38.48 kg/m.Marland Kitchen Seen by dietician.  I agree with the assessment and plan as outlined below: Nutrition Status: Nutrition Problem: Inadequate oral intake Etiology: inability to eat, dysphagia Signs/Symptoms: NPO status Interventions: Tube feeding  . Skin Assessment: I have examined the patient's skin and I agree with the wound assessment as performed by the wound care RN as outlined below: Pressure Injury 03/04/23 Other (Comment) Medial Stage 2 -  Partial thickness loss of dermis presenting as a shallow open injury with a red, pink wound bed without slough. (Active)  03/04/23 2000  Location: Other (Comment) (intergluteal cleft)  Location Orientation: Medial  Staging: Stage 2 -  Partial thickness loss of dermis presenting as a shallow open injury with a red, pink wound bed without slough.  Wound Description (Comments):   Present on Admission: Yes  Dressing Type Foam - Lift dressing to assess site every shift 03/18/23 0516    Consultants:  Orthopedics  Procedures:  As above  Antimicrobials:  Anti-infectives (From admission, onward)    Start     Dose/Rate Route Frequency Ordered Stop   03/12/23 1215  azithromycin (ZITHROMAX)  500 mg in sodium chloride 0.9 % 250 mL IVPB  Status:  Discontinued        500 mg 250 mL/hr over 60 Minutes Intravenous  Once 03/12/23 1115 03/12/23 1116   03/08/23 1400  cefTRIAXone (ROCEPHIN) 2 g in sodium chloride 0.9 % 100 mL IVPB        2 g 200 mL/hr over 30 Minutes Intravenous Daily 03/08/23 1333 03/12/23 1100   03/08/23 1400  azithromycin (ZITHROMAX) 500 mg in sodium chloride 0.9 % 250 mL IVPB        500 mg 250 mL/hr over 60 Minutes Intravenous Daily 03/08/23 1333 03/12/23 1400   03/05/23 2100  ceFAZolin (ANCEF) IVPB 2g/100 mL premix        2 g 200 mL/hr over 30 Minutes Intravenous Every 8 hours 03/05/23 1646 03/06/23 1552   03/05/23 1317  vancomycin (VANCOCIN) powder  Status:  Discontinued          As needed 03/05/23 1317 03/05/23 1426   03/05/23 1030  ceFAZolin (ANCEF) IVPB 2g/100 mL premix        2 g 200 mL/hr over 30 Minutes Intravenous On call to O.R. 03/05/23 0937 03/05/23 1310         Subjective: Patient seen and examined.  Although she was on 10 L of oxygen but she appeared very comfortable.  Son had no concerns.  Objective: Vitals:   03/17/23 2343 03/18/23 0142 03/18/23 0339 03/18/23 0455  BP: (!) 91/41 (!) 89/60 (!) 90/57 (!) 97/51  Pulse: (!) 50 (!) 48 (!) 48 (!) 44  Resp: (!) 40 (!) 30 (!) 32 (!) 32  Temp:   98 F (36.7 C) 98 F (36.7 C)  TempSrc:   Axillary Axillary  SpO2: 100% 100% 99% 97%  Weight:   118.2 kg   Height:        Intake/Output Summary (Last 24 hours) at 03/18/2023 0733 Last data filed at 03/18/2023 0030 Gross per 24 hour  Intake 200 ml  Output 1400 ml  Net -1200 ml   Filed Weights   03/16/23 0500 03/17/23 0437 03/18/23 0339  Weight: 123.8 kg 119.6 kg 118.2 kg    Examination:  General exam: Appears calm and comfortable  Respiratory system: Bilateral rhonchi and crackles.  Poor inspiratory effort. Cardiovascular system: S1 & S2 heard, RRR. No JVD, murmurs, rubs, gallops or clicks. No pedal edema. Gastrointestinal system: Abdomen is  nondistended, soft and nontender. No organomegaly or masses felt. Normal bowel sounds heard. Central nervous system: Alert, unable to assess orientation due to patient nonverbal and deaf.  No focal neurological deficits. Extremities: Symmetric 5 x 5 power. Skin: No rashes, lesions or ulcers.    Data Reviewed: I have personally reviewed following labs and imaging studies  CBC: Recent Labs  Lab 03/12/23 0528 03/13/23 0436 03/14/23 0408 03/16/23 0605 03/18/23 0218  WBC 10.3 11.2* 13.0* 13.5* 22.8*  NEUTROABS  --   --   --  9.0* 19.8*  HGB 7.9* 8.1* 8.2* 8.3* 8.4*  HCT 26.1* 27.2* 27.2*  26.4* 26.5*  MCV 93.9 96.5 95.1 93.3 91.7  PLT 275 334 312 322 332   Basic Metabolic Panel: Recent Labs  Lab 03/14/23 0408 03/15/23 0414 03/16/23 0605 03/17/23 1832 03/18/23 0218  NA 150* 140 137 137 141  K 4.2 4.2 4.5 4.9 5.5*  CL 116* 107 107 102 104  CO2 26 24 25 23 22   GLUCOSE 117* 113* 109* 120* 131*  BUN 59* 62* 76* 100* 102*  CREATININE 1.60* 1.66* 1.87* 1.98* 2.39*  CALCIUM 8.6* 8.2* 8.3* 8.3* 8.2*  MG  --   --   --  2.0  --    GFR: Estimated Creatinine Clearance: 24.9 mL/min (A) (by C-G formula based on SCr of 2.39 mg/dL (H)). Liver Function Tests: Recent Labs  Lab 03/12/23 0528 03/13/23 0436 03/14/23 0408  AST 30 27 27   ALT <5 <5 7  ALKPHOS 34* 43 41  BILITOT 0.6 0.6 0.6  PROT 5.4* 5.4* 5.6*  ALBUMIN 2.2* 2.1* 2.2*   No results for input(s): "LIPASE", "AMYLASE" in the last 168 hours. No results for input(s): "AMMONIA" in the last 168 hours. Coagulation Profile: No results for input(s): "INR", "PROTIME" in the last 168 hours. Cardiac Enzymes: No results for input(s): "CKTOTAL", "CKMB", "CKMBINDEX", "TROPONINI" in the last 168 hours. BNP (last 3 results) No results for input(s): "PROBNP" in the last 8760 hours. HbA1C: No results for input(s): "HGBA1C" in the last 72 hours. CBG: Recent Labs  Lab 03/17/23 1209 03/17/23 1613 03/17/23 2012 03/18/23 0029  03/18/23 0407  GLUCAP 99 114* 115* 121* 128*   Lipid Profile: No results for input(s): "CHOL", "HDL", "LDLCALC", "TRIG", "CHOLHDL", "LDLDIRECT" in the last 72 hours. Thyroid Function Tests: Recent Labs    03/18/23 0058  TSH 3.297   Anemia Panel: No results for input(s): "VITAMINB12", "FOLATE", "FERRITIN", "TIBC", "IRON", "RETICCTPCT" in the last 72 hours. Sepsis Labs: Recent Labs  Lab 03/17/23 1832  LATICACIDVEN 1.5     Recent Results (from the past 240 hours)  Culture, blood (Routine X 2) w Reflex to ID Panel     Status: None   Collection Time: 03/08/23  2:09 PM   Specimen: BLOOD RIGHT ARM  Result Value Ref Range Status   Specimen Description BLOOD RIGHT ARM  Final   Special Requests   Final    BOTTLES DRAWN AEROBIC AND ANAEROBIC Blood Culture results may not be optimal due to an inadequate volume of blood received in culture bottles   Culture   Final    NO GROWTH 5 DAYS Performed at [facility], [address], [place], [place] [address]    Report Status 03/13/2023 FINAL  Final  Culture, blood (Routine X 2) w Reflex to ID Panel     Status: None   Collection Time: 03/08/23  2:09 PM   Specimen: BLOOD RIGHT ARM  Result Value Ref Range Status   Specimen Description BLOOD RIGHT ARM  Final   Special Requests   Final    BOTTLES DRAWN AEROBIC AND ANAEROBIC Blood Culture results may not be optimal due to an inadequate volume of blood received in culture bottles   Culture   Final    NO GROWTH 5 DAYS Performed at [facility], [address] [place], [place] [address]    Report Status 03/13/2023 FINAL  Final     Radiology Studies: DG Chest Port 1 View Result Date: 03/17/2023 CLINICAL DATA:  Acute respiratory distress EXAM: PORTABLE CHEST 1 VIEW COMPARISON:  03/14/2023 FINDINGS: Cardiomegaly. Aortic atherosclerosis.  Mild vascular congestion. Right basilar airspace opacity. No confluent opacity on the left. No overt edema or effusions. No acute bony abnormality.  IMPRESSION: Cardiomegaly, vascular congestion. Right base atelectasis or infiltrate. Electronically Signed   By: Charlett Nose M.D.   On: 03/17/2023 19:14    Scheduled Meds:  acetaminophen  650 mg Oral Q6H   ascorbic acid  500 mg Oral BID   dorzolamide-timolol  1 drop Both Eyes BID   enoxaparin (LOVENOX) injection  40 mg Subcutaneous Q24H   escitalopram  10 mg Oral Daily   feeding supplement (PROSource TF20)  60 mL Per Tube BID   fiber supplement (BANATROL TF)  60 mL Per Tube BID   free water  150 mL Per Tube Q4H   Gerhardt's butt cream   Topical BID   memantine  10 mg Oral BID   multivitamin with minerals  1 tablet Oral Daily   oxybutynin  5 mg Oral TID   pneumococcal 20-valent conjugate vaccine  0.5 mL Intramuscular Tomorrow-1000   zinc sulfate (50mg  elemental zinc)  220 mg Oral Daily   Continuous Infusions:  feeding supplement (OSMOLITE 1.5 CAL) Stopped (03/17/23 0500)     LOS: 14 days   Hughie Closs, MD [facility]  03/18/2023, 7:33 AM   *Please note that this is a verbal dictation therefore any spelling or grammatical errors are due to the "Dragon Medical One" system interpretation.  Please page via Amion and do not message via secure chat for urgent patient care matters. Secure chat can be used for non urgent patient care matters.  How to contact the [facility] Attending or Consulting provider 7A - 7P or covering provider during after hours 7P -7A, for this patient?  Check the care team in [facility] and look for a) attending/consulting [facility] provider listed and b) the [facility] team listed. Page or secure chat 7A-7P. Log into [email or web address] and use [facility] universal password to access. If you do not have the password, please contact the hospital operator. Locate the [facility] provider you are looking for under [facility] and page to a number that you can be directly reached. If you still have difficulty reaching the provider, please page the [facility] (Director on Call) for the  Hospitalists listed on amion for assistance.

## 2023-03-18 NOTE — Plan of Care (Signed)
  Problem: Clinical Measurements: Goal: Diagnostic test results will improve Outcome: Progressing   Problem: Respiratory: Goal: Ability to maintain adequate ventilation will improve Outcome: Progressing   

## 2023-03-18 NOTE — Progress Notes (Addendum)
 Pharmacy Antibiotic Note  Sharon Ramos is a 83 y.o. female admitted on 03/04/2023 with aspiration pneumonia.  Pharmacy has been consulted for Unasyn dosing.  Pt had labored breathing overnight and a rapid response was called. CT chest with right base atelectasis or infiltrate. WBC 22.8. Tmax 101.3, Scr 1.98 > 2.39.   Plan: Start Unasyn 3 grams IV every 12 hours Monitor renal function and overall clinical picture F/u LOT  Height: 5\' 9"  (175.3 cm) Weight: 118.2 kg (260 lb 9.3 oz) IBW/kg (Calculated) : 66.2  Temp (24hrs), Avg:99.3 F (37.4 C), Min:98 F (36.7 C), Max:101.3 F (38.5 C)  Recent Labs  Lab 03/12/23 0528 03/13/23 0436 03/14/23 0408 03/15/23 0414 03/16/23 0605 03/17/23 1832 03/18/23 0218  WBC 10.3 11.2* 13.0*  --  13.5*  --  22.8*  CREATININE 1.50* 1.48* 1.60* 1.66* 1.87* 1.98* 2.39*  LATICACIDVEN  --   --   --   --   --  1.5  --     Estimated Creatinine Clearance: 24.9 mL/min (A) (by C-G formula based on SCr of 2.39 mg/dL (H)).    No Known Allergies  Antimicrobials this admission: Azithromycin 3/6 >> 3/10 Ceftriaxone 3/6 >> 3/10  Unasyn 3/16 >>    Microbiology results: 3/6 BCx: ngtd 3/3 MRSA PCR: neg  Thank you for allowing pharmacy to be a part of this patient's care.  Griffin Dakin 03/18/2023 7:34 AM

## 2023-03-19 ENCOUNTER — Inpatient Hospital Stay (HOSPITAL_COMMUNITY)

## 2023-03-19 DIAGNOSIS — S72401A Unspecified fracture of lower end of right femur, initial encounter for closed fracture: Secondary | ICD-10-CM | POA: Diagnosis not present

## 2023-03-19 LAB — CBC WITH DIFFERENTIAL/PLATELET
Abs Immature Granulocytes: 0.15 10*3/uL — ABNORMAL HIGH (ref 0.00–0.07)
Abs Immature Granulocytes: 0.12 10*3/uL — ABNORMAL HIGH (ref 0.00–0.07)
Basophils Absolute: 0 10*3/uL (ref 0.0–0.1)
Basophils Absolute: 0 10*3/uL (ref 0.0–0.1)
Basophils Relative: 0 %
Basophils Relative: 0 %
Eosinophils Absolute: 0.2 10*3/uL (ref 0.0–0.5)
Eosinophils Absolute: 0.2 10*3/uL (ref 0.0–0.5)
Eosinophils Relative: 2 %
Eosinophils Relative: 2 %
HCT: 24.1 % — ABNORMAL LOW (ref 36.0–46.0)
HCT: 25.1 % — ABNORMAL LOW (ref 36.0–46.0)
Hemoglobin: 7.5 g/dL — ABNORMAL LOW (ref 12.0–15.0)
Hemoglobin: 7.7 g/dL — ABNORMAL LOW (ref 12.0–15.0)
Immature Granulocytes: 2 %
Immature Granulocytes: 1 %
Lymphocytes Relative: 8 %
Lymphocytes Relative: 8 %
Lymphs Abs: 0.8 10*3/uL (ref 0.7–4.0)
Lymphs Abs: 0.7 10*3/uL (ref 0.7–4.0)
MCH: 28.6 pg (ref 26.0–34.0)
MCH: 28.3 pg (ref 26.0–34.0)
MCHC: 31.1 g/dL (ref 30.0–36.0)
MCHC: 30.7 g/dL (ref 30.0–36.0)
MCV: 92 fL (ref 80.0–100.0)
MCV: 92.3 fL (ref 80.0–100.0)
Monocytes Absolute: 0.7 10*3/uL (ref 0.1–1.0)
Monocytes Absolute: 0.6 10*3/uL (ref 0.1–1.0)
Monocytes Relative: 7 %
Monocytes Relative: 7 %
Neutro Abs: 8 10*3/uL — ABNORMAL HIGH (ref 1.7–7.7)
Neutro Abs: 7.2 10*3/uL (ref 1.7–7.7)
Neutrophils Relative %: 81 %
Neutrophils Relative %: 82 %
Platelets: 308 10*3/uL (ref 150–400)
Platelets: 338 10*3/uL (ref 150–400)
RBC: 2.62 MIL/uL — ABNORMAL LOW (ref 3.87–5.11)
RBC: 2.72 MIL/uL — ABNORMAL LOW (ref 3.87–5.11)
RDW: 19.6 % — ABNORMAL HIGH (ref 11.5–15.5)
RDW: 19.8 % — ABNORMAL HIGH (ref 11.5–15.5)
WBC: 9.8 10*3/uL (ref 4.0–10.5)
WBC: 8.8 10*3/uL (ref 4.0–10.5)
nRBC: 0 % (ref 0.0–0.2)
nRBC: 0.3 % — ABNORMAL HIGH (ref 0.0–0.2)

## 2023-03-19 LAB — BASIC METABOLIC PANEL
Anion gap: 10 (ref 5–15)
BUN: 104 mg/dL — ABNORMAL HIGH (ref 8–23)
CO2: 25 mmol/L (ref 22–32)
Calcium: 7.7 mg/dL — ABNORMAL LOW (ref 8.9–10.3)
Chloride: 105 mmol/L (ref 98–111)
Creatinine, Ser: 2.33 mg/dL — ABNORMAL HIGH (ref 0.44–1.00)
GFR, Estimated: 20 mL/min — ABNORMAL LOW (ref >60)
Glucose, Bld: 106 mg/dL — ABNORMAL HIGH (ref 70–99)
Potassium: 4.5 mmol/L (ref 3.5–5.1)
Sodium: 140 mmol/L (ref 135–145)

## 2023-03-19 LAB — GLUCOSE, CAPILLARY
Glucose-Capillary: 91 mg/dL (ref 70–99)
Glucose-Capillary: 94 mg/dL (ref 70–99)
Glucose-Capillary: 104 mg/dL — ABNORMAL HIGH (ref 70–99)
Glucose-Capillary: 94 mg/dL (ref 70–99)

## 2023-03-19 LAB — MAGNESIUM: Magnesium: 2.2 mg/dL (ref 1.7–2.4)

## 2023-03-19 MED ORDER — VITAMIN D 25 MCG (1000 UNIT) PO TABS
1000.0000 [IU] | ORAL_TABLET | Freq: Every day | ORAL | Status: DC
  Administered 2023-03-20 – 2023-03-23 (×4): 1000 [IU] via ORAL
  Filled 2023-03-19 (×4): qty 1

## 2023-03-19 NOTE — Progress Notes (Signed)
 Speech Language Pathology Treatment: Dysphagia  Patient Details Name: Sharon Ramos MRN: [id number] DOB: 05-12-1940 Today's Date: 03/19/2023 Time: 4098-1191 SLP Time Calculation (min) (ACUTE ONLY): 8 min  Assessment / Plan / Recommendation Clinical Impression  Brief visit in the midst of pts PT session. Pt sitting edge of bed with support from PT, interpreter also present. Pt very attentive and communicative today. No difficulty engaging pt in task. She was able to take ice chips and sips of thin water without immediate signs of aspiration. She was able to phonate to indicate clear vocal quality. Oral hygiene appeared much improved from last week. There were no standing oral secretions today. Recommend resuming prior diet dys 1 honey, ice chips also allowed for oral hygiene and pleasure. This is already a highly restrictive diet. Discussed with daughter that pt is suspected to have a baseline dysphagia and risk of aspiration is present with all PO intake and may fluctuate from day to day. They are doing really good work on oral hygiene and decreasing bacterial load of aspirate. Pt is also more alert and self feeding more that before. Despite poor prognosis for improved swallowing, pts risk for aspiration pna is decreasing given these factors. Will f/u tomorrow to check for tolerance. Would plan to repeat MBS prior to d/c to determine least restrictive diet at pts maximum level of function.   HPI HPI: Sharon Ramos is an 83 y.o. 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  CT of the knee showed comminuted periprosthetic distal femur fracture, orthopedic surgery completed on 3/4.      SLP Plan  Continue with current plan of care      Recommendations for follow up  therapy are one component of a multi-disciplinary discharge planning process, led by the attending physician.  Recommendations may be updated based on patient status, additional functional criteria and insurance authorization.    Recommendations  Diet recommendations: Dysphagia 1 (puree);Honey-thick liquid Liquids provided via: Cup;Teaspoon Medication Administration: Via alternative means Supervision: Patient able to self feed;Full supervision/cueing for compensatory strategies Compensations: Slow rate Postural Changes and/or Swallow Maneuvers: Seated upright 90 degrees                  Oral care QID           Continue with current plan of care   Harlon Ditty, MA CCC-SLP  Acute Rehabilitation Services Secure Chat Preferred Office [phone]   Claudine Mouton  03/19/2023, 1:14 PM

## 2023-03-19 NOTE — Progress Notes (Addendum)
 PROGRESS NOTE    Sharon Ramos  XLK:[id number] DOB: [birth year] DOA: 03/04/2023 PCP: Theodis Shove, DO   Brief Narrative:  83 y.o. 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  CT of the knee showed comminuted periprosthetic distal femur fracture, orthopedic surgery was consulted, later did not  recommended surgical intervention.   Assessment & Plan:   Principal Problem:   Closed fracture of right distal femur (HCC) Active Problems:   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)   Anemia of chronic disease   Chronic kidney disease, stage 4 (severe) (HCC)   Generalized anxiety disorder   Mutism   Sensorineural hearing loss (SNHL) of both ears   Severe obesity (HCC)  Closed fracture of right distal femur (HCC) Secondary to mechanical falls. Orthopedic surgery was consulted recommended status post surgical intervention on 03/06/2023 narcotics per BTK surgery. Analgesics and DVT prophylaxis per orthopedic surgery, Eliquis twice a day for 30 days. Continue with analgesia as needed  Acute respiratory failure with hypoxia due to pulmonary edema/acute on chronic diastolic congestive heart failure/aspiration pneumonia: On the night of 03/14/2023, patient was moaning, she was given Dilaudid, post pain medication, patient started having shortness of breath and became hypoxic requiring 6 L of oxygen.  Chest x-ray showed vascular congestion.  No infiltrates.  Started on IV Lasix and eventually she was weaned to room air. Ordered echo which showed normal ejection fraction and grade 2 diastolic dysfunction.  However yet again on the late afternoon 03/17/2023, patient developed respiratory distress, requiring nonrebreather, rapid response was called.  Chest x-ray was  repeated, this once again showed some pulmonary edema but more so right lower lobe infiltrate.  Procalcitonin 29.32,  with patient having history of aspiration and on tube feeds, this is highly likely a true aspiration pneumonia so started on Unasyn.  Eventually she is weaned to room air.  And she appears much more alert and her lungs are sounding better as well.   Acute metabolic encephalopathy: Likely secondary to below sepsis with PNA.  Held Robaxin and melatonin. CT of the head reviewed, neg Mentation now much improved, conversing and following commands   Sepsis secondary to aspiration pneumonia, not present on admission:  On 3/6, p t had increased lethargy, with fevers up to 101.89F as of 3/6 and tachypnea Ordered and reviewed CXR, findings of L>R patchy basilar airspace disease with repeat CXR demonstrating worse changes. SARS-CoV-2 influenza and RSV PCR negative.  Patient improved on azithromycin and Rocephin and she has completed the course as well.  However she developed pneumonia again on 03/17/2023, please see details above.   Essential hypertension: Blood pressure on the low side.  Will hold Norvasc for now.   New acute kidney injury on chronic kidney disease stage IIIb-patient does not have CKD stage IV: Last creatinine in chart is from December 2022 which was 2.09 with GFR of 24.  However prior to that, creatinine was around 1.5-1.7 making it CKD stage IIIb.  Patient's creatinine upon arrival was at baseline but rose and peaked at 3.19 on 03/06/2023.  Creatinine improved to 1.9 but rising again and 2.4 yesterday, awaiting today's labs, Lasix discontinued and will avoid nephrotoxic agents.  Addendum: Labs back.  Creatinine slightly better.  Hyperkalemia: 5.5 yesterday, treated with Lokelma, normal later yesterday.  Pending labs today.  Anxiety/delusion/auditory elucidation/Alzheimer's dementia: Continue Lexapro and Namenda.   OAB: Continue oxybutynin.   Morbid obesity: Weight  loss and diet modification counseled.   Hypernatremia Resolved.  Sacral decubitus ulcer present on admission stage II: RN Pressure Injury Documentation:        Pressure Injury 03/04/23 Other (Comment) Medial Stage 2 -  Partial thickness loss of dermis presenting as a shallow open injury with a red, pink wound bed without slough. (Active)  03/04/23 2000  Location: Other (Comment)  Location Orientation: Medial  Staging: Stage 2 -  Partial thickness loss of dermis presenting as a shallow open injury with a red, pink wound bed without slough.  Wound Description (Comments):   Present on Admission: Yes  Dressing Type Foam - Lift dressing to assess site every shift 03/06/23 1930    Acute blood loss anemia -Likely secondary to recent surgery -improved after 1 unit PRBC transfusion.  Remained stable for several days and down to 7.5 today.  No reports of melena, hematochezia or hematemesis.  No other signs of bleeding.  Will repeat CBC at 5 PM.  Transfuse if less than 7 hemoglobin.   FEN/dysphagia: -Patient maintained on tube feeds, SLP following, started on dysphagia 1 diet 03/13/2023.  Slow weaning from tube feeds per dietitian and SLP.  Underwent MBS 03/14/2023.  Tube feed changed to nocturnal only on 03/16/2023.  GOC/poor prognosis: Due to patient's poor prognosis, on 03/18/2023 I had lengthy discussion with patient's son at the bedside.  We discussed the difference between DNR and full code.  I recommended switching her to DNR but he wanted her to be full code.  I encouraged him to have frank discussion among the siblings and consider DNR status.  I also touched the topic of possible PEG tube placement if they want to continue to be aggressive because patient appears to have recurrent aspiration, I have consulted palliative care to have detailed GOC conversation with the patient's family. Today 03/19/2023, I was able to meet her daughter Gabryel Talamo who is the youngest of all the siblings but she  is the Education officer, environmental.  I repeated similar discussion with her.  She wanted to take some time and think over this.  I made her aware that palliative care will be seeing patient and talking to her as well.  DVT prophylaxis: enoxaparin (LOVENOX) injection 30 mg Start: 03/18/23 1600 SCDs Start: 03/05/23 1647 SCDs Start: 03/04/23 1751   Code Status: Full Code  Family Communication: Daughter present at the bedside.  Status is: Inpatient: still tube feed dependent and hypoxic with recurrent pneumonia   Estimated body mass index is 38.87 kg/m as calculated from the following:   Height as of this encounter: 5\' 9"  (1.753 m).   Weight as of this encounter: 119.4 kg.  Pressure Injury 03/04/23 Other (Comment) Medial Stage 2 -  Partial thickness loss of dermis presenting as a shallow open injury with a red, pink wound bed without slough. (Active)  03/04/23 2000  Location: Other (Comment) (intergluteal cleft)  Location Orientation: Medial  Staging: Stage 2 -  Partial thickness loss of dermis presenting as a shallow open injury with a red, pink wound bed without slough.  Wound Description (Comments):   Present on Admission: Yes  Dressing Type Foam - Lift dressing to assess site every shift 03/19/23 0507   Nutritional Assessment: Body mass index is 38.87 kg/m.Marland Kitchen Seen by dietician.  I agree with the assessment and plan as outlined below: Nutrition Status: Nutrition Problem: Inadequate oral intake Etiology: inability  to eat, dysphagia Signs/Symptoms: NPO status Interventions: Tube feeding  . Skin Assessment: I have examined the patient's skin and I agree with the wound assessment as performed by the wound care RN as outlined below: Pressure Injury 03/04/23 Other (Comment) Medial Stage 2 -  Partial thickness loss of dermis presenting as a shallow open injury with a red, pink wound bed without slough. (Active)  03/04/23 2000  Location: Other (Comment) (intergluteal cleft)  Location Orientation:  Medial  Staging: Stage 2 -  Partial thickness loss of dermis presenting as a shallow open injury with a red, pink wound bed without slough.  Wound Description (Comments):   Present on Admission: Yes  Dressing Type Foam - Lift dressing to assess site every shift 03/19/23 0507    Consultants:  Orthopedics  Procedures:  As above  Antimicrobials:  Anti-infectives (From admission, onward)    Start     Dose/Rate Route Frequency Ordered Stop   03/18/23 0830  Ampicillin-Sulbactam (UNASYN) 3 g in sodium chloride 0.9 % 100 mL IVPB        3 g 200 mL/hr over 30 Minutes Intravenous Every 12 hours 03/18/23 0742     03/12/23 1215  azithromycin (ZITHROMAX) 500 mg in sodium chloride 0.9 % 250 mL IVPB  Status:  Discontinued        500 mg 250 mL/hr over 60 Minutes Intravenous  Once 03/12/23 1115 03/12/23 1116   03/08/23 1400  cefTRIAXone (ROCEPHIN) 2 g in sodium chloride 0.9 % 100 mL IVPB        2 g 200 mL/hr over 30 Minutes Intravenous Daily 03/08/23 1333 03/12/23 1100   03/08/23 1400  azithromycin (ZITHROMAX) 500 mg in sodium chloride 0.9 % 250 mL IVPB        500 mg 250 mL/hr over 60 Minutes Intravenous Daily 03/08/23 1333 03/12/23 1400   03/05/23 2100  ceFAZolin (ANCEF) IVPB 2g/100 mL premix        2 g 200 mL/hr over 30 Minutes Intravenous Every 8 hours 03/05/23 1646 03/06/23 1552   03/05/23 1317  vancomycin (VANCOCIN) powder  Status:  Discontinued          As needed 03/05/23 1317 03/05/23 1426   03/05/23 1030  ceFAZolin (ANCEF) IVPB 2g/100 mL premix        2 g 200 mL/hr over 30 Minutes Intravenous On call to O.R. 03/05/23 1610 03/05/23 1310         Subjective: Seen and examined.  Appears comfortable.  No complaints  Objective: Vitals:   03/18/23 2043 03/18/23 2339 03/19/23 0500 03/19/23 0507  BP:  (!) 126/50  116/65  Pulse: 62 62  85  Resp:  16  20  Temp:  98.2 F (36.8 C)    TempSrc:  Oral  Oral  SpO2: 100% 98%  93%  Weight:   119.4 kg   Height:        Intake/Output  Summary (Last 24 hours) at 03/19/2023 0801 Last data filed at 03/19/2023 0400 Gross per 24 hour  Intake 100.06 ml  Output 1200 ml  Net -1099.94 ml   Filed Weights   03/17/23 0437 03/18/23 0339 03/19/23 0500  Weight: 119.6 kg 118.2 kg 119.4 kg    Examination:  General exam: Appears calm and comfortable  Respiratory system: Faint rhonchi, much better than yesterday.  Respiratory effort normal. Cardiovascular system: S1 & S2 heard, RRR. No JVD, murmurs, rubs, gallops or clicks. No pedal edema. Gastrointestinal system: Abdomen is nondistended, soft and nontender. No organomegaly or masses  felt. Normal bowel sounds heard. Central nervous system: Alert but unable to assess orientation.  No focal neurological deficits. Extremities: Symmetric 5 x 5 power. Skin: No rashes, lesions or ulcers.   Data Reviewed: I have personally reviewed following labs and imaging studies  CBC: Recent Labs  Lab 03/13/23 0436 03/14/23 0408 03/16/23 0605 03/18/23 0218  WBC 11.2* 13.0* 13.5* 22.8*  NEUTROABS  --   --  9.0* 19.8*  HGB 8.1* 8.2* 8.3* 8.4*  HCT 27.2* 27.2* 26.4* 26.5*  MCV 96.5 95.1 93.3 91.7  PLT 334 312 322 332   Basic Metabolic Panel: Recent Labs  Lab 03/14/23 0408 03/15/23 0414 03/16/23 0605 03/17/23 1832 03/18/23 0218 03/18/23 1602  NA 150* 140 137 137 141  --   K 4.2 4.2 4.5 4.9 5.5* 4.9  CL 116* 107 107 102 104  --   CO2 26 24 25 23 22   --   GLUCOSE 117* 113* 109* 120* 131*  --   BUN 59* 62* 76* 100* 102*  --   CREATININE 1.60* 1.66* 1.87* 1.98* 2.39*  --   CALCIUM 8.6* 8.2* 8.3* 8.3* 8.2*  --   MG  --   --   --  2.0  --   --    GFR: Estimated Creatinine Clearance: 25.1 mL/min (A) (by C-G formula based on SCr of 2.39 mg/dL (H)). Liver Function Tests: Recent Labs  Lab 03/13/23 0436 03/14/23 0408  AST 27 27  ALT <5 7  ALKPHOS 43 41  BILITOT 0.6 0.6  PROT 5.4* 5.6*  ALBUMIN 2.1* 2.2*   No results for input(s): "LIPASE", "AMYLASE" in the last 168 hours. No  results for input(s): "AMMONIA" in the last 168 hours. Coagulation Profile: No results for input(s): "INR", "PROTIME" in the last 168 hours. Cardiac Enzymes: No results for input(s): "CKTOTAL", "CKMB", "CKMBINDEX", "TROPONINI" in the last 168 hours. BNP (last 3 results) No results for input(s): "PROBNP" in the last 8760 hours. HbA1C: No results for input(s): "HGBA1C" in the last 72 hours. CBG: Recent Labs  Lab 03/18/23 0743 03/18/23 1254 03/18/23 1704 03/18/23 2037 03/18/23 2346  GLUCAP 114* 105* 104* 95 90   Lipid Profile: No results for input(s): "CHOL", "HDL", "LDLCALC", "TRIG", "CHOLHDL", "LDLDIRECT" in the last 72 hours. Thyroid Function Tests: Recent Labs    03/18/23 0058  TSH 3.297   Anemia Panel: No results for input(s): "VITAMINB12", "FOLATE", "FERRITIN", "TIBC", "IRON", "RETICCTPCT" in the last 72 hours. Sepsis Labs: Recent Labs  Lab 03/17/23 1832 03/18/23 0218  PROCALCITON  --  29.32  LATICACIDVEN 1.5  --      No results found for this or any previous visit (from the past 240 hours).    Radiology Studies: DG Chest Port 1 View Result Date: 03/17/2023 CLINICAL DATA:  Acute respiratory distress EXAM: PORTABLE CHEST 1 VIEW COMPARISON:  03/14/2023 FINDINGS: Cardiomegaly. Aortic atherosclerosis. Mild vascular congestion. Right basilar airspace opacity. No confluent opacity on the left. No overt edema or effusions. No acute bony abnormality. IMPRESSION: Cardiomegaly, vascular congestion. Right base atelectasis or infiltrate. Electronically Signed   By: Charlett Nose M.D.   On: 03/17/2023 19:14    Scheduled Meds:  acetaminophen  650 mg Oral Q6H   ascorbic acid  500 mg Oral BID   dorzolamide-timolol  1 drop Both Eyes BID   enoxaparin (LOVENOX) injection  30 mg Subcutaneous Q24H   escitalopram  10 mg Oral Daily   feeding supplement (PROSource TF20)  60 mL Per Tube BID   fiber supplement Jerrel Ivory  TF)  60 mL Per Tube BID   free water  150 mL Per Tube Q4H    Gerhardt's butt cream   Topical BID   memantine  10 mg Oral BID   multivitamin with minerals  1 tablet Oral Daily   oxybutynin  5 mg Oral TID   pneumococcal 20-valent conjugate vaccine  0.5 mL Intramuscular Tomorrow-1000   zinc sulfate (50mg  elemental zinc)  220 mg Oral Daily   Continuous Infusions:  ampicillin-sulbactam (UNASYN) IV 3 g (03/18/23 2250)   feeding supplement (OSMOLITE 1.5 CAL) Stopped (03/17/23 0500)     LOS: 15 days   Hughie Closs, MD [facility]  03/19/2023, 8:01 AM   *Please note that this is a verbal dictation therefore any spelling or grammatical errors are due to the "Dragon Medical One" system interpretation.  Please page via Amion and do not message via secure chat for urgent patient care matters. Secure chat can be used for non urgent patient care matters.  How to contact the [facility] Attending or Consulting provider 7A - 7P or covering provider during after hours 7P -7A, for this patient?  Check the care team in [facility] and look for a) attending/consulting [facility] provider listed and b) the [facility] team listed. Page or secure chat 7A-7P. Log into [email or web address] and use [place]'s universal password to access. If you do not have the password, please contact the hospital operator. Locate the [facility] provider you are looking for under [facility] and page to a number that you can be directly reached. If you still have difficulty reaching the provider, please page the [facility] (Director on Call) for the Hospitalists listed on amion for assistance.

## 2023-03-19 NOTE — Progress Notes (Signed)
 Orthopaedic Trauma Progress Note  SUBJECTIVE: Patient doing fairly well today.  Much more awake and alert than when I last saw her about a week ago.  Pain remains controlled.  Has not required oral or IV pain medications or muscle relaxers in the last 5 days.  Has been working with therapies, making some progress.  Hospitalization has been complicated by pneumonia.  Patient's daughter who is her interpreter is at bedside.  Notes patient has had complaints of left knee pain, more so than her right lower extremity which had surgery.  Pain worsens with movement.  No imaging of the left knee has been done.   OBJECTIVE:  Vitals:   03/19/23 0817 03/19/23 1250  BP: (!) 119/52 (!) 134/59  Pulse: (!) 59 63  Resp: 19 18  Temp: 98.7 F (37.1 C) (!) 97.5 F (36.4 C)  SpO2: 100%     General: Resting in bed, no acute distress.   Respiratory: No increased work of breathing.  Right lower extremity: Dressing removed, incisions are clean, dry, intact.  Left sutures in place.  No significant tenderness with palpation throughout the thigh or calf.  Ankle dorsiflexion/plantarflexion intact.  Motor and sensory function intact distally.  2+ DP pulse  IMAGING: Repeat imaging right knee/femur performed today, imaging stable.  No signs of any hardware failure or loosening.  Fracture remains in appropriate alignment.  LABS:  Results for orders placed or performed during the hospital encounter of 03/04/23 (from the past 24 hours)  Glucose, capillary     Status: Abnormal   Collection Time: 03/18/23  5:04 PM  Result Value Ref Range   Glucose-Capillary 104 (H) 70 - 99 mg/dL  Glucose, capillary     Status: None   Collection Time: 03/18/23  8:37 PM  Result Value Ref Range   Glucose-Capillary 95 70 - 99 mg/dL  Glucose, capillary     Status: None   Collection Time: 03/18/23 11:46 PM  Result Value Ref Range   Glucose-Capillary 90 70 - 99 mg/dL  Glucose, capillary     Status: None   Collection Time: 03/19/23  8:14  AM  Result Value Ref Range   Glucose-Capillary 91 70 - 99 mg/dL  CBC with Differential/Platelet     Status: Abnormal   Collection Time: 03/19/23  8:20 AM  Result Value Ref Range   WBC 9.8 4.0 - 10.5 K/uL   RBC 2.62 (L) 3.87 - 5.11 MIL/uL   Hemoglobin 7.5 (L) 12.0 - 15.0 g/dL   HCT 40.9 (L) 81.1 - 91.4 %   MCV 92.0 80.0 - 100.0 fL   MCH 28.6 26.0 - 34.0 pg   MCHC 31.1 30.0 - 36.0 g/dL   RDW 78.2 (H) 95.6 - 21.3 %   Platelets 308 150 - 400 K/uL   nRBC 0.0 0.0 - 0.2 %   Neutrophils Relative % 81 %   Neutro Abs 8.0 (H) 1.7 - 7.7 K/uL   Lymphocytes Relative 8 %   Lymphs Abs 0.8 0.7 - 4.0 K/uL   Monocytes Relative 7 %   Monocytes Absolute 0.7 0.1 - 1.0 K/uL   Eosinophils Relative 2 %   Eosinophils Absolute 0.2 0.0 - 0.5 K/uL   Basophils Relative 0 %   Basophils Absolute 0.0 0.0 - 0.1 K/uL   Immature Granulocytes 2 %   Abs Immature Granulocytes 0.15 (H) 0.00 - 0.07 K/uL  Basic metabolic panel     Status: Abnormal   Collection Time: 03/19/23  8:20 AM  Result Value Ref Range  Sodium 140 135 - 145 mmol/L   Potassium 4.5 3.5 - 5.1 mmol/L   Chloride 105 98 - 111 mmol/L   CO2 25 22 - 32 mmol/L   Glucose, Bld 106 (H) 70 - 99 mg/dL   BUN 782 (H) 8 - 23 mg/dL   Creatinine, Ser 9.56 (H) 0.44 - 1.00 mg/dL   Calcium 7.7 (L) 8.9 - 10.3 mg/dL   GFR, Estimated 20 (L) >60 mL/min   Anion gap 10 5 - 15  Magnesium     Status: None   Collection Time: 03/19/23  8:20 AM  Result Value Ref Range   Magnesium 2.2 1.7 - 2.4 mg/dL  Glucose, capillary     Status: None   Collection Time: 03/19/23 12:46 PM  Result Value Ref Range   Glucose-Capillary 94 70 - 99 mg/dL    ASSESSMENT: Sharon Ramos is a 83 y.o. female, 14 Days Post-Op s/p OPEN REDUCTION INTERNAL FIXATION RIGHT DISTAL FEMUR FRACTURE  CV/Blood loss: Acute blood loss anemia, Hgb 7.5 this morning. Hemodynamically stable  PLAN: Weightbearing: WBAT RLE ROM: Okay for knee motion as tolerated Incisional and dressing care: Okay to  leave incisions open to air Showering: Okay to allow incisions to get wet when showering  Orthopedic device(s): None  Pain management: Tylenol, Dilaudid, Robaxin  VTE prophylaxis: Lovenox, SCDs ID:  Ancef 2gm post op completed Foley/Lines:  No foley, KVO IVFs Impediments to Fracture Healing: Vitamin D level low normal at 33, start supplementation  Dispo: PT/OT evaluation ongoing, recommending SNF.  Will plan to leave sutures in place to the right lower extremity for now and will check back with patient on Wednesday, 03/21/2023 for possible removal.  Have ordered left knee x-rays for tomorrow   Discharge recommendations:  - Tylenol as needed for pain control - Eliquis 2.5 mg twice daily x 30 days for DVT prophylaxis - Continue 1000 units vitamin D3 supplementation daily x 90 days  Follow - up plan: 2 weeks after discharge for wound check and repeat x-rays   Contact information:  Truitt Merle MD, Thyra Breed PA-C. After hours and holidays please check [email or web address] for group call information for [facility]   Thompson Caul, PA-C [phone] (office) [email or web address]

## 2023-03-19 NOTE — Progress Notes (Signed)
 Physical Therapy Treatment Patient Details Name: Sharon Ramos MRN: [id number] DOB: 30-Oct-1940 Today's Date: 03/19/2023   History of Present Illness Patient is a 83 year old female with  ground-level fall with a right supracondylar periprosthetic distal femur fracture. S/p ORIF.  History of hypertension, CKD 4, anxiety, hallucination, Alzheimer's dementia, anemia, deaf, mute, OAB, and obesity.    PT Comments  Pt's daughter in room when PT, Mobility Specialist and ASL interpreter entered. After helping pt to wake up and wash her face, daughter leaves so pt will focus on therapy. Pt continues to requires total Ax2 for bed mobility. Pt able to achieve and maintain seated balance more easily today despite air mattress surface. Pt sat ~25 min EoB working on seated balance when SLP entered. PT facilitated posterior support in seated so that pt could participate in SLP assessment. Afterwards, pt returned to supine with total A. D/c plan remains appropriate. PT will continue to follow acutely.     If plan is discharge home, recommend the following: Two people to help with walking and/or transfers;Two people to help with bathing/dressing/bathroom;Assistance with cooking/housework;Assistance with feeding;Direct supervision/assist for medications management;Direct supervision/assist for financial management;Assist for transportation;Help with stairs or ramp for entrance;Supervision due to cognitive status   Can travel by private vehicle     No  Equipment Recommendations  Hoyer lift;Hospital bed;Wheelchair (measurements PT);Wheelchair cushion (measurements PT);BSC/3in1       Precautions / Restrictions Precautions Precautions: Fall Recall of Precautions/Restrictions: Impaired Precaution/Restrictions Comments: Cortrack; Rectal Tube Restrictions Weight Bearing Restrictions Per Provider Order: Yes RLE Weight Bearing Per Provider Order: Weight bearing as tolerated     Mobility  Bed Mobility Overal  bed mobility: Needs Assistance Bed Mobility: Supine to Sit, Sit to Supine Rolling: Total assist, +2 for physical assistance   Supine to sit: Total assist, HOB elevated Sit to supine: Total assist   General bed mobility comments: pt provides minimal assistance with management of LE to floor and trunk to upright, pt provides  slightly more assist for laying trunk down but does need total A for bringing LE back into bed    Transfers                   General transfer comment: unable/unsafe to attempt at this time due to poor participation        Balance Overall balance assessment: Needs assistance Sitting-balance support: Feet supported Sitting balance-Leahy Scale: Poor Sitting balance - Comments: initially requiring maxA for maintaining balance on EoB, able to achieve sitting balance with placement of hands on knees and increased forward lean, pt requires reminder for forward lean throughout session Postural control: Right lateral lean, Posterior lean                                  Communication Communication Communication: Impaired Factors Affecting Communication: Hearing impaired;Difficulty expressing self (difficulty with signing with ASL used in house interpreter)  Cognition Arousal: Alert Behavior During Therapy: Flat affect   PT - Cognitive impairments: Difficult to assess Difficult to assess due to: Hard of hearing/deaf                     PT - Cognition Comments: Daughter Olegario Messier, and in-house ASL interpreter aided with translation Following commands: Impaired Following commands impaired: Follows one step commands inconsistently    Cueing Cueing Techniques: Verbal cues, Gestural cues, Tactile cues, Visual cues     General Comments  General comments (skin integrity, edema, etc.): VSS on RA, intermittent use of suction to clear phlegm, SLP in room at end of sitting bout and able to have pt swallow some water.      Pertinent Vitals/Pain  Pain Assessment Pain Assessment: Faces Faces Pain Scale: Hurts little more Breathing: occasional labored breathing, short period of hyperventilation Negative Vocalization: none Facial Expression: facial grimacing Body Language: relaxed Consolability: distracted or reassured by voice/touch PAINAD Score: 4 Pain Location: R LE with movement Pain Descriptors / Indicators: Grimacing, Guarding, Moaning     PT Goals (current goals can now be found in the care plan section) Acute Rehab PT Goals Patient Stated Goal: patient unable to participate with goal setting PT Goal Formulation: Patient unable to participate in goal setting Time For Goal Achievement: 03/20/23 Potential to Achieve Goals: Fair Progress towards PT goals: Progressing toward goals    Frequency    Min 2X/week           Co-evaluation PT/OT/SLP Co-Evaluation/Treatment: Yes       SLP goals addressed during session: Swallowing    AM-PAC PT "6 Clicks" Mobility   Outcome Measure  Help needed turning from your back to your side while in a flat bed without using bedrails?: Total Help needed moving from lying on your back to sitting on the side of a flat bed without using bedrails?: Total Help needed moving to and from a bed to a chair (including a wheelchair)?: Total Help needed standing up from a chair using your arms (e.g., wheelchair or bedside chair)?: Total Help needed to walk in hospital room?: Total Help needed climbing 3-5 steps with a railing? : Total 6 Click Score: 6    End of Session Equipment Utilized During Treatment: Oxygen Activity Tolerance: Patient tolerated treatment well Patient left: in bed;with call bell/phone within reach;with bed alarm set (Prevalon boots in place) Nurse Communication: Mobility status PT Visit Diagnosis: Muscle weakness (generalized) (M62.81);Unsteadiness on feet (R26.81)     Time: 4098-1191 PT Time Calculation (min) (ACUTE ONLY): 43 min  Charges:    $Therapeutic  Exercise: 8-22 mins $Therapeutic Activity: 38-52 mins PT General Charges $$ ACUTE PT VISIT: 1 Visit                     Jenney Brester B. Beverely Risen PT, DPT Acute Rehabilitation Services Please use secure chat or  Call Office [phone]    Elon Alas [facility] 03/19/2023, 12:50 PM

## 2023-03-19 NOTE — Progress Notes (Signed)
 Physical Therapy Treatment Patient Details Name: Sharon Ramos MRN: [id number] DOB: 1940-06-30 Today's Date: 03/19/2023   History of Present Illness Patient is a 83 year old female with  ground-level fall with a right supracondylar periprosthetic distal femur fracture. S/p ORIF.  History of hypertension, CKD 4, anxiety, hallucination, Alzheimer's dementia, anemia, deaf, mute, OAB, and obesity.    PT Comments  ASL interpreter present and extremely helpful throughout session as pt and interpreter have worked together for a long time. Pt continues to require total A for bed mobility, but is able to sit for ~30 to work on seated balance. SLP in room at end of session and able to utilize sitting upright for swallowing assessment. Pt very happy to be able to drink some water. Pt requires total A for return to bed. PT will continue to follow acutely.     If plan is discharge home, recommend the following: Two people to help with walking and/or transfers;Two people to help with bathing/dressing/bathroom;Assistance with cooking/housework;Assistance with feeding;Direct supervision/assist for medications management;Direct supervision/assist for financial management;Assist for transportation;Help with stairs or ramp for entrance;Supervision due to cognitive status   Can travel by private vehicle     No  Equipment Recommendations  Hoyer lift;Hospital bed;Wheelchair (measurements PT);Wheelchair cushion (measurements PT);BSC/3in1       Precautions / Restrictions Precautions Precautions: Fall Recall of Precautions/Restrictions: Impaired Precaution/Restrictions Comments: Cortrack; Rectal Tube Restrictions Weight Bearing Restrictions Per Provider Order: Yes RLE Weight Bearing Per Provider Order: Weight bearing as tolerated     Mobility  Bed Mobility Overal bed mobility: Needs Assistance Bed Mobility: Supine to Sit, Sit to Supine Rolling: Total assist, +2 for physical assistance   Supine to sit:  Total assist, HOB elevated Sit to supine: Total assist   General bed mobility comments: pt provides minimal assistance with management of LE to floor and trunk to upright, pt provides  slightly more assist for laying trunk down but does need total A for bringing LE back into bed    Transfers                   General transfer comment: unable/unsafe to attempt at this time due to poor participation        Balance Overall balance assessment: Needs assistance Sitting-balance support: Feet supported Sitting balance-Leahy Scale: Poor Sitting balance - Comments: initially requiring maxA for maintaining balance on EoB, able to achieve sitting balance with placement of hands on knees and increased forward lean, pt requires reminder for forward lean throughout session Postural control: Right lateral lean, Posterior lean                                  Communication Communication Communication: Impaired Factors Affecting Communication: Hearing impaired;Difficulty expressing self (difficulty with signing with ASL used in house interpreter)  Cognition Arousal: Alert Behavior During Therapy: Flat affect   PT - Cognitive impairments: Difficult to assess Difficult to assess due to: Hard of hearing/deaf                     PT - Cognition Comments: Daughter Olegario Messier, and in-house ASL interpreter aided with translation Following commands: Impaired Following commands impaired: Follows one step commands inconsistently    Cueing Cueing Techniques: Verbal cues, Gestural cues, Tactile cues, Visual cues  Exercises General Exercises - Lower Extremity Hip Flexion/Marching: PROM, Left, 10 reps    General Comments General comments (skin integrity, edema, etc.):  VSS on RA, intermittent use of suction to clear phlegm, SLP in room at end of sitting bout and able to have pt swallow some water.      Pertinent Vitals/Pain Pain Assessment Pain Assessment: Faces Faces Pain  Scale: Hurts little more Breathing: occasional labored breathing, short period of hyperventilation Negative Vocalization: none Facial Expression: facial grimacing Body Language: relaxed Consolability: distracted or reassured by voice/touch PAINAD Score: 4 Pain Location: R LE with movement Pain Descriptors / Indicators: Grimacing, Guarding, Moaning     PT Goals (current goals can now be found in the care plan section) Acute Rehab PT Goals Patient Stated Goal: patient unable to participate with goal setting PT Goal Formulation: Patient unable to participate in goal setting Time For Goal Achievement: 03/20/23 Potential to Achieve Goals: Fair Progress towards PT goals: Progressing toward goals    Frequency    Min 2X/week       Co-evaluation PT/OT/SLP Co-Evaluation/Treatment: Yes       SLP goals addressed during session: Swallowing    AM-PAC PT "6 Clicks" Mobility   Outcome Measure  Help needed turning from your back to your side while in a flat bed without using bedrails?: Total Help needed moving from lying on your back to sitting on the side of a flat bed without using bedrails?: Total Help needed moving to and from a bed to a chair (including a wheelchair)?: Total Help needed standing up from a chair using your arms (e.g., wheelchair or bedside chair)?: Total Help needed to walk in hospital room?: Total Help needed climbing 3-5 steps with a railing? : Total 6 Click Score: 6    End of Session Equipment Utilized During Treatment: Oxygen Activity Tolerance: Patient tolerated treatment well Patient left: in bed;with call bell/phone within reach;with bed alarm set (Prevalon boots in place) Nurse Communication: Mobility status PT Visit Diagnosis: Muscle weakness (generalized) (M62.81);Unsteadiness on feet (R26.81)     Time: 4132-4401 PT Time Calculation (min) (ACUTE ONLY): 43 min  Charges:    $Therapeutic Exercise: 8-22 mins $Therapeutic Activity: 38-52 mins PT  General Charges $$ ACUTE PT VISIT: 1 Visit                     Lesia Monica B. Beverely Risen PT, DPT Acute Rehabilitation Services Please use secure chat or  Call Office [phone]    Elon Alas [facility] 03/19/2023, 12:49 PM

## 2023-03-20 ENCOUNTER — Inpatient Hospital Stay (HOSPITAL_COMMUNITY)

## 2023-03-20 DIAGNOSIS — S72401A Unspecified fracture of lower end of right femur, initial encounter for closed fracture: Secondary | ICD-10-CM | POA: Diagnosis not present

## 2023-03-20 LAB — BASIC METABOLIC PANEL
Anion gap: 15 (ref 5–15)
BUN: 101 mg/dL — ABNORMAL HIGH (ref 8–23)
CO2: 24 mmol/L (ref 22–32)
Calcium: 8 mg/dL — ABNORMAL LOW (ref 8.9–10.3)
Chloride: 105 mmol/L (ref 98–111)
Creatinine, Ser: 2.23 mg/dL — ABNORMAL HIGH (ref 0.44–1.00)
GFR, Estimated: 21 mL/min — ABNORMAL LOW (ref >60)
Glucose, Bld: 93 mg/dL (ref 70–99)
Potassium: 4 mmol/L (ref 3.5–5.1)
Sodium: 144 mmol/L (ref 135–145)

## 2023-03-20 LAB — CBC WITH DIFFERENTIAL/PLATELET
Abs Immature Granulocytes: 0.1 10*3/uL — ABNORMAL HIGH (ref 0.00–0.07)
Basophils Absolute: 0 10*3/uL (ref 0.0–0.1)
Basophils Relative: 0 %
Eosinophils Absolute: 0.3 10*3/uL (ref 0.0–0.5)
Eosinophils Relative: 3 %
HCT: 24.1 % — ABNORMAL LOW (ref 36.0–46.0)
Hemoglobin: 7.4 g/dL — ABNORMAL LOW (ref 12.0–15.0)
Immature Granulocytes: 1 %
Lymphocytes Relative: 12 %
Lymphs Abs: 0.9 10*3/uL (ref 0.7–4.0)
MCH: 28.6 pg (ref 26.0–34.0)
MCHC: 30.7 g/dL (ref 30.0–36.0)
MCV: 93.1 fL (ref 80.0–100.0)
Monocytes Absolute: 0.6 10*3/uL (ref 0.1–1.0)
Monocytes Relative: 8 %
Neutro Abs: 5.7 10*3/uL (ref 1.7–7.7)
Neutrophils Relative %: 76 %
Platelets: 319 10*3/uL (ref 150–400)
RBC: 2.59 MIL/uL — ABNORMAL LOW (ref 3.87–5.11)
RDW: 19.8 % — ABNORMAL HIGH (ref 11.5–15.5)
WBC: 7.6 10*3/uL (ref 4.0–10.5)
nRBC: 0.4 % — ABNORMAL HIGH (ref 0.0–0.2)

## 2023-03-20 LAB — GLUCOSE, CAPILLARY
Glucose-Capillary: 104 mg/dL — ABNORMAL HIGH (ref 70–99)
Glucose-Capillary: 101 mg/dL — ABNORMAL HIGH (ref 70–99)
Glucose-Capillary: 96 mg/dL (ref 70–99)
Glucose-Capillary: 110 mg/dL — ABNORMAL HIGH (ref 70–99)
Glucose-Capillary: 111 mg/dL — ABNORMAL HIGH (ref 70–99)
Glucose-Capillary: 121 mg/dL — ABNORMAL HIGH (ref 70–99)

## 2023-03-20 MED ORDER — AMOXICILLIN-POT CLAVULANATE 500-125 MG PO TABS
1.0000 | ORAL_TABLET | Freq: Two times a day (BID) | ORAL | Status: DC
  Administered 2023-03-21 – 2023-03-23 (×5): 1 via ORAL
  Filled 2023-03-20 (×6): qty 1

## 2023-03-20 NOTE — TOC Progression Note (Signed)
 Transition of Care [facility]) - Progression Note    Patient Details  Name: Sharon Ramos MRN: [id number] Date of Birth: 1940/08/29  [facility] CM/SW Contact  Michaela Corner, [place] Phone Number: 03/20/2023, 10:22 AM  Clinical Narrative:   Per chart review, MD consulted Palliative for GOC discussion.   TOC will continue to follow.    Expected Discharge Plan: Home w Home Health Services Barriers to Discharge: Continued Medical Work up, SNF Pending bed offer  Expected Discharge Plan and Services In-house Referral: Clinical Social Work Discharge Planning Services: CM Consult Post Acute Care Choice: Home Health, Durable Medical Equipment Living arrangements for the past 2 months: Single Family Home                 DME Arranged: Hospital bed, High strength lightweight manual wheelchair with seat cushion, Bedside commode (hoyer lift) DME Agency: [employer] Date DME Agency Contacted: 03/12/23 Time DME Agency Contacted: 1330 Representative spoke with at DME Agency: Vaughan Basta HH Arranged: PT, OT, Nurse's Aide HH Agency: [facility] Date [facility] Agency Contacted: 03/12/23 Time HH Agency Contacted: 1335 Representative spoke with at [facility] Agency: Kandee Keen   Social Determinants of Health (SDOH) Interventions SDOH Screenings   Food Insecurity: No Food Insecurity (03/04/2023)  Housing: Low Risk  (03/04/2023)  Transportation Needs: No Transportation Needs (03/04/2023)  Utilities: Not At Risk (03/04/2023)  Social Connections: Moderately Isolated (03/04/2023)  Tobacco Use: Low Risk  (03/05/2023)    Readmission Risk Interventions     No data to display

## 2023-03-20 NOTE — Progress Notes (Signed)
 PROGRESS NOTE    Sharon Ramos  GEX:[id number] DOB: Feb 05, 1940 DOA: 03/04/2023 PCP: Theodis Shove, DO   Brief Narrative:  83 y.o. 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  CT of the knee showed comminuted periprosthetic distal femur fracture, orthopedic surgery was consulted, later did not  recommended surgical intervention.   Assessment & Plan:   Principal Problem:   Closed fracture of right distal femur (HCC) Active Problems:   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)   Anemia of chronic disease   Chronic kidney disease, stage 4 (severe) (HCC)   Generalized anxiety disorder   Mutism   Sensorineural hearing loss (SNHL) of both ears   Severe obesity (HCC)  Closed fracture of right distal femur (HCC) Secondary to mechanical falls. Orthopedic surgery was consulted recommended status post surgical intervention on 03/06/2023 narcotics per [facility] surgery.ortho recommends Eliquis twice a day for 30 days for DVT prophylaxis. Continue 1000 units vitamin D3 supplementation daily x 90 days .  Acute respiratory failure with hypoxia due to pulmonary edema/acute on chronic diastolic congestive heart failure/aspiration pneumonia: On the night of 03/14/2023, patient was moaning, she was given Dilaudid, post pain medication, patient started having shortness of breath and became hypoxic requiring 6 L of oxygen.  Chest x-ray showed vascular congestion.  No infiltrates.  Started on IV Lasix and eventually she was weaned to room air. Ordered echo which showed normal ejection fraction and grade 2 diastolic dysfunction.  However yet again on the late afternoon 03/17/2023, patient developed respiratory distress, requiring nonrebreather, rapid response was called.  Chest  x-ray was repeated, this once again showed some pulmonary edema but more so right lower lobe infiltrate.  Procalcitonin 29.32,  with patient having history of aspiration and on tube feeds, this is highly likely a true aspiration pneumonia so started on Unasyn.  Patient was made NPO.  Reassessed by SLP and started on dysphagia 1 diet on 03/19/2023.  Eventually she is weaned to room air on the evening of 03/18/2023.  And she appears much more alert and her lungs are sounding better as well.   Acute metabolic encephalopathy: Likely secondary to below sepsis with PNA.  Held Robaxin and melatonin. CT of the head reviewed, neg Mentation now much improved, conversing and following commands   Sepsis secondary to aspiration pneumonia, not present on admission:  On 3/6, p t had increased lethargy, with fevers up to 101.23F as of 3/6 and tachypnea Ordered and reviewed CXR, findings of L>R patchy basilar airspace disease with repeat CXR demonstrating worse changes. SARS-CoV-2 influenza and RSV PCR negative.  Patient improved on azithromycin and Rocephin and she has completed the course as well.  However she developed pneumonia again on 03/17/2023, please see details above.   Essential hypertension: Blood pressure on the low side.  Will hold Norvasc for now.   New acute kidney injury on chronic kidney disease stage IIIb-patient does not have CKD stage IV: Last creatinine in chart is from December 2022 which was 2.09 with GFR of 24.  However prior to that, creatinine was around 1.5-1.7 making it CKD stage IIIb.  Patient's creatinine upon arrival was at baseline but rose and peaked at 3.19 on 03/06/2023.  Creatinine improved to 1.9 but rising again and 2.3 yesterday, awaiting today's labs, Lasix discontinued and will avoid  nephrotoxic agents.   Hyperkalemia: Resolved   Anxiety/delusion/auditory elucidation/Alzheimer's dementia: Continue Lexapro and Namenda.   OAB: Continue oxybutynin.   Morbid obesity: Weight  loss and diet modification counseled.   Hypernatremia Resolved.  Sacral decubitus ulcer present on admission stage II: RN Pressure Injury Documentation:        Pressure Injury 03/04/23 Other (Comment) Medial Stage 2 -  Partial thickness loss of dermis presenting as a shallow open injury with a red, pink wound bed without slough. (Active)  03/04/23 2000  Location: Other (Comment)  Location Orientation: Medial  Staging: Stage 2 -  Partial thickness loss of dermis presenting as a shallow open injury with a red, pink wound bed without slough.  Wound Description (Comments):   Present on Admission: Yes  Dressing Type Foam - Lift dressing to assess site every shift 03/06/23 1930    Acute blood loss anemia -Likely secondary to recent surgery -improved after 1 unit PRBC transfusion.  Remained stable for several days and down to 7.4 today.  No reports of melena, hematochezia or hematemesis.  No other signs of bleeding.  Will repeat CBC at 5 PM.  Transfuse if less than 7 hemoglobin.   FEN/dysphagia: -Patient maintained on tube feeds, SLP following, started on dysphagia 1 diet 03/13/2023.  Slow weaning from tube feeds per dietitian and SLP.  Underwent MBS 03/14/2023.  Tube feed changed to nocturnal only on 03/16/2023.  Plan to repeat MBS prior to discharge.  GOC/poor prognosis: Due to patient's poor prognosis, on 03/18/2023 I had lengthy discussion with patient's son at the bedside.  We discussed the difference between DNR and full code.  I recommended switching her to DNR but he wanted her to be full code.  I encouraged him to have frank discussion among the siblings and consider DNR status.  I also touched the topic of possible PEG tube placement if they want to continue to be aggressive because patient appears to have recurrent aspiration, I have consulted palliative care to have detailed GOC conversation with the patient's family. Today 03/19/2023, I was able to meet her daughter Sharon Ramos who is  the youngest of all the siblings but she is the Education officer, environmental.  I repeated similar discussion with her.  She wanted to take some time and think over this.  I made her aware that palliative care will be seeing patient and talking to her as well.  03/20/2023: Since patient has improved somewhat, daughter wants to keep her full code and wants to continue aggressive care at the moment.  She was made aware that palliative care may be visiting her soon as well.  DVT prophylaxis: enoxaparin (LOVENOX) injection 30 mg Start: 03/18/23 1600 SCDs Start: 03/05/23 1647 SCDs Start: 03/04/23 1751   Code Status: Full Code  Family Communication: Daughter present at the bedside.  Status is: Inpatient: still tube feed dependent    Estimated body mass index is 38.19 kg/m as calculated from the following:   Height as of this encounter: 5\' 9"  (1.753 m).   Weight as of this encounter: 117.3 kg.  Pressure Injury 03/04/23 Other (Comment) Medial Stage 2 -  Partial thickness loss of dermis presenting as a shallow open injury with a red, pink wound bed without slough. (Active)  03/04/23 2000  Location: Other (Comment) (intergluteal cleft)  Location Orientation: Medial  Staging: Stage 2 -  Partial thickness loss of dermis presenting as a shallow open injury with a red, pink wound bed without slough.  Wound Description (Comments):   Present  on Admission: Yes  Dressing Type Foam - Lift dressing to assess site every shift 03/19/23 2220   Nutritional Assessment: Body mass index is 38.19 kg/m.Marland Kitchen Seen by dietician.  I agree with the assessment and plan as outlined below: Nutrition Status: Nutrition Problem: Inadequate oral intake Etiology: inability to eat, dysphagia Signs/Symptoms: NPO status Interventions: Tube feeding  . Skin Assessment: I have examined the patient's skin and I agree with the wound assessment as performed by the wound care RN as outlined below: Pressure Injury 03/04/23 Other (Comment) Medial  Stage 2 -  Partial thickness loss of dermis presenting as a shallow open injury with a red, pink wound bed without slough. (Active)  03/04/23 2000  Location: Other (Comment) (intergluteal cleft)  Location Orientation: Medial  Staging: Stage 2 -  Partial thickness loss of dermis presenting as a shallow open injury with a red, pink wound bed without slough.  Wound Description (Comments):   Present on Admission: Yes  Dressing Type Foam - Lift dressing to assess site every shift 03/19/23 2220    Consultants:  Orthopedics  Procedures:  As above  Antimicrobials:  Anti-infectives (From admission, onward)    Start     Dose/Rate Route Frequency Ordered Stop   03/18/23 0830  Ampicillin-Sulbactam (UNASYN) 3 g in sodium chloride 0.9 % 100 mL IVPB        3 g 200 mL/hr over 30 Minutes Intravenous Every 12 hours 03/18/23 0742     03/12/23 1215  azithromycin (ZITHROMAX) 500 mg in sodium chloride 0.9 % 250 mL IVPB  Status:  Discontinued        500 mg 250 mL/hr over 60 Minutes Intravenous  Once 03/12/23 1115 03/12/23 1116   03/08/23 1400  cefTRIAXone (ROCEPHIN) 2 g in sodium chloride 0.9 % 100 mL IVPB        2 g 200 mL/hr over 30 Minutes Intravenous Daily 03/08/23 1333 03/12/23 1100   03/08/23 1400  azithromycin (ZITHROMAX) 500 mg in sodium chloride 0.9 % 250 mL IVPB        500 mg 250 mL/hr over 60 Minutes Intravenous Daily 03/08/23 1333 03/12/23 1400   03/05/23 2100  ceFAZolin (ANCEF) IVPB 2g/100 mL premix        2 g 200 mL/hr over 30 Minutes Intravenous Every 8 hours 03/05/23 1646 03/06/23 1552   03/05/23 1317  vancomycin (VANCOCIN) powder  Status:  Discontinued          As needed 03/05/23 1317 03/05/23 1426   03/05/23 1030  ceFAZolin (ANCEF) IVPB 2g/100 mL premix        2 g 200 mL/hr over 30 Minutes Intravenous On call to O.R. 03/05/23 6962 03/05/23 1310         Subjective: Seen and examined, daughter at the bedside helping with the sign language.  Patient is feeling much better.  Much  more alert and oriented.  Objective: Vitals:   03/20/23 0054 03/20/23 0454 03/20/23 0500 03/20/23 0746  BP: (!) 115/45 (!) 137/58  122/71  Pulse: (!) 58 (!) 59    Resp: 18 16  16   Temp: 98 F (36.7 C) 97.6 F (36.4 C)  97.6 F (36.4 C)  TempSrc: Oral Oral  Oral  SpO2: 97% 97%    Weight:   117.3 kg   Height:        Intake/Output Summary (Last 24 hours) at 03/20/2023 0821 Last data filed at 03/20/2023 0454 Gross per 24 hour  Intake 540 ml  Output 250 ml  Net 290  ml   Filed Weights   03/18/23 0339 03/19/23 0500 03/20/23 0500  Weight: 118.2 kg 119.4 kg 117.3 kg    Examination:  General exam: Appears calm and comfortable  Respiratory system: Very faint rhonchi bilaterally, more so on the right side, with poor inspiratory effort Cardiovascular system: S1 & S2 heard, RRR. No JVD, murmurs, rubs, gallops or clicks. No pedal edema. Gastrointestinal system: Abdomen is nondistended, soft and nontender. No organomegaly or masses felt. Normal bowel sounds heard. Central nervous system: Alert but unable to assess orientation.  No focal neurological deficits. Extremities: Symmetric 5 x 5 power. Skin: No rashes, lesions or ulcers.    Data Reviewed: I have personally reviewed following labs and imaging studies  CBC: Recent Labs  Lab 03/16/23 0605 03/18/23 0218 03/19/23 0820 03/19/23 1646 03/20/23 0547  WBC 13.5* 22.8* 9.8 8.8 7.6  NEUTROABS 9.0* 19.8* 8.0* 7.2 5.7  HGB 8.3* 8.4* 7.5* 7.7* 7.4*  HCT 26.4* 26.5* 24.1* 25.1* 24.1*  MCV 93.3 91.7 92.0 92.3 93.1  PLT 322 332 308 338 319   Basic Metabolic Panel: Recent Labs  Lab 03/15/23 0414 03/16/23 0605 03/17/23 1832 03/18/23 0218 03/18/23 1602 03/19/23 0820  NA 140 137 137 141  --  140  K 4.2 4.5 4.9 5.5* 4.9 4.5  CL 107 107 102 104  --  105  CO2 24 25 23 22   --  25  GLUCOSE 113* 109* 120* 131*  --  106*  BUN 62* 76* 100* 102*  --  104*  CREATININE 1.66* 1.87* 1.98* 2.39*  --  2.33*  CALCIUM 8.2* 8.3* 8.3* 8.2*  --   7.7*  MG  --   --  2.0  --   --  2.2   GFR: Estimated Creatinine Clearance: 25.4 mL/min (A) (by C-G formula based on SCr of 2.33 mg/dL (H)). Liver Function Tests: Recent Labs  Lab 03/14/23 0408  AST 27  ALT 7  ALKPHOS 41  BILITOT 0.6  PROT 5.6*  ALBUMIN 2.2*   No results for input(s): "LIPASE", "AMYLASE" in the last 168 hours. No results for input(s): "AMMONIA" in the last 168 hours. Coagulation Profile: No results for input(s): "INR", "PROTIME" in the last 168 hours. Cardiac Enzymes: No results for input(s): "CKTOTAL", "CKMB", "CKMBINDEX", "TROPONINI" in the last 168 hours. BNP (last 3 results) No results for input(s): "PROBNP" in the last 8760 hours. HbA1C: No results for input(s): "HGBA1C" in the last 72 hours. CBG: Recent Labs  Lab 03/19/23 1612 03/19/23 2059 03/20/23 0105 03/20/23 0458 03/20/23 0745  GLUCAP 104* 94 104* 101* 96   Lipid Profile: No results for input(s): "CHOL", "HDL", "LDLCALC", "TRIG", "CHOLHDL", "LDLDIRECT" in the last 72 hours. Thyroid Function Tests: Recent Labs    03/18/23 0058  TSH 3.297   Anemia Panel: No results for input(s): "VITAMINB12", "FOLATE", "FERRITIN", "TIBC", "IRON", "RETICCTPCT" in the last 72 hours. Sepsis Labs: Recent Labs  Lab 03/17/23 1832 03/18/23 0218  PROCALCITON  --  29.32  LATICACIDVEN 1.5  --      No results found for this or any previous visit (from the past 240 hours).    Radiology Studies: DG FEMUR, MIN 2 VIEWS RIGHT Result Date: 03/19/2023 CLINICAL DATA:  Closed femur fracture.  Follow-up. EXAM: RIGHT FEMUR 2 VIEWS COMPARISON:  Radiographs 03/05/2023 and 03/04/2023.  CT 03/04/2023. FINDINGS: The hardware is intact without loosening status post lateral plate and screw fixation of the comminuted distal femur fracture. Posterior displacement of the fracture on the lateral view may be slightly  increased, now measuring 1.5 cm. No interval fracture healing identified. Stable postsurgical changes from  previous right total knee arthroplasty. Stable moderate degenerative changes at the right hip. IMPRESSION: Possible slight increase in posterior displacement of the comminuted distal femur fracture status post ORIF. No evidence of hardware loosening or interval fracture healing. Electronically Signed   By: Carey Bullocks M.D.   On: 03/19/2023 14:35    Scheduled Meds:  acetaminophen  650 mg Oral Q6H   ascorbic acid  500 mg Oral BID   cholecalciferol  1,000 Units Oral Daily   dorzolamide-timolol  1 drop Both Eyes BID   enoxaparin (LOVENOX) injection  30 mg Subcutaneous Q24H   escitalopram  10 mg Oral Daily   feeding supplement (PROSource TF20)  60 mL Per Tube BID   fiber supplement (BANATROL TF)  60 mL Per Tube BID   free water  150 mL Per Tube Q4H   Gerhardt's butt cream   Topical BID   memantine  10 mg Oral BID   multivitamin with minerals  1 tablet Oral Daily   oxybutynin  5 mg Oral TID   pneumococcal 20-valent conjugate vaccine  0.5 mL Intramuscular Tomorrow-1000   zinc sulfate (50mg  elemental zinc)  220 mg Oral Daily   Continuous Infusions:  ampicillin-sulbactam (UNASYN) IV 3 g (03/19/23 2047)   feeding supplement (OSMOLITE 1.5 CAL) Stopped (03/17/23 0500)     LOS: 16 days   Hughie Closs, MD [facility]  03/20/2023, 8:21 AM   *Please note that this is a verbal dictation therefore any spelling or grammatical errors are due to the "[employer]agon [employer] One" system interpretation.  Please page via Amion and do not message via secure chat for urgent patient care matters. Secure chat can be used for non urgent patient care matters.  How to contact the [facility] Attending or Consulting provider 7A - 7P or covering provider during after hours 7P -7A, for this patient?  Check the care team in [facility] and look for a) attending/consulting [facility] provider listed and b) the [facility] team listed. Page or secure chat 7A-7P. Log into [email or web address] and use [place]'s universal password to access. If you  do not have the password, please contact the hospital operator. Locate the [facility] provider you are looking for under [facility] and page to a number that you can be directly reached. If you still have difficulty reaching the provider, please page the [facility] (Director on Call) for the Hospitalists listed on amion for assistance.

## 2023-03-20 NOTE — Progress Notes (Signed)
 Occupational Therapy Treatment Patient Details Name: Sharon Ramos MRN: [id number] DOB: 1940/06/24 Today's Date: 03/20/2023   History of present illness Patient is a 83 year old female with  ground-level fall with a right supracondylar periprosthetic distal femur fracture. S/p ORIF.  History of hypertension, CKD 4, anxiety, hallucination, Alzheimer's dementia, anemia, deaf, mute, OAB, and obesity.   OT comments  Pt progressing well towards goals. Tolerated EOB treatment session well, with mod assist to maintain upright position. Grooming and upper body bathing performed with mod assist, and min loss of balance. Pt progressing well, but continues to be limited by decreased strength and activity tolerance. Recommendation of <3 hours of skilled rehab daily to optimize independence levels. Will continue to follow acutely.      If plan is discharge home, recommend the following:  Two people to help with walking and/or transfers;Two people to help with bathing/dressing/bathroom;Assistance with cooking/housework;Assistance with feeding;Direct supervision/assist for medications management;Direct supervision/assist for financial management;Assist for transportation;Supervision due to cognitive status   Equipment Recommendations  Other (comment) (Defer to next venue)    Recommendations for Other Services      Precautions / Restrictions Precautions Precautions: Fall Recall of Precautions/Restrictions: Impaired Precaution/Restrictions Comments: Cortrack; Rectal Tube Restrictions Weight Bearing Restrictions Per Provider Order: Yes RLE Weight Bearing Per Provider Order: Weight bearing as tolerated       Mobility Bed Mobility Overal bed mobility: Needs Assistance Bed Mobility: Supine to Sit, Sit to Supine Rolling: Total assist, +2 for physical assistance   Supine to sit: Total assist, HOB elevated Sit to supine: Total assist   General bed mobility comments: pt provides minimal assistance  with management of LE to floor and trunk to upright, pt provides  slightly more assist for laying trunk down but does need total A for bringing LE back into bed    Transfers                   General transfer comment: Not safe to attempt at this time     Balance Overall balance assessment: Needs assistance Sitting-balance support: Feet supported Sitting balance-Leahy Scale: Poor Sitting balance - Comments: Pt requiring mod assist to maintain upright position during functional task Postural control: Left lateral lean, Posterior lean                                 ADL either performed or assessed with clinical judgement   ADL Overall ADL's : Needs assistance/impaired     Grooming: Wash/dry face;Maximal assistance;Bed level Grooming Details (indicate cue type and reason): Increased time and cueing Upper Body Bathing: Moderate assistance;Sitting Upper Body Bathing Details (indicate cue type and reason): with cues pt able to bathe upper body     Upper Body Dressing : Maximal assistance;Sitting                     General ADL Comments: Completed sitting EOB with mod assist to maintain upright position    Extremity/Trunk Assessment Upper Extremity Assessment Upper Extremity Assessment: Generalized weakness   Lower Extremity Assessment Lower Extremity Assessment: Defer to PT evaluation        Vision   Vision Assessment?: No apparent visual deficits   Perception     Praxis     Communication Communication Communication: Impaired Factors Affecting Communication: Hearing impaired;Difficulty expressing self   Cognition Arousal: Alert Behavior During Therapy: Flat affect Cognition: History of cognitive impairments  OT - Cognition Comments: Daughter states mild dementia                 Following commands: Impaired Following commands impaired: Follows one step commands inconsistently, Follows one step commands with  increased time      Cueing   Cueing Techniques: Verbal cues, Gestural cues, Tactile cues, Visual cues  Exercises      Shoulder Instructions       General Comments      Pertinent Vitals/ Pain       Pain Assessment Pain Assessment: Faces Faces Pain Scale: Hurts little more Pain Location: R LE with movement Pain Descriptors / Indicators: Grimacing Pain Intervention(s): Monitored during session, Repositioned  Home Living                                          Prior Functioning/Environment              Frequency  Min 1X/week        Progress Toward Goals  OT Goals(current goals can now be found in the care plan section)  Progress towards OT goals: Progressing toward goals  Acute Rehab OT Goals OT Goal Formulation: With patient Time For Goal Achievement: 03/20/23 Potential to Achieve Goals: Good ADL Goals Pt Will Perform Eating: with min assist;bed level Pt Will Perform Grooming: with min assist;bed level Pt Will Perform Upper Body Dressing: with set-up;bed level Pt Will Perform Lower Body Dressing: with min assist;bed level Additional ADL Goal #1: Pt will complete bed mobility with min assist to aid in ADLs  Plan      Co-evaluation            SLP goals addressed during session: Swallowing    AM-PAC OT "6 Clicks" Daily Activity     Outcome Measure   Help from another person eating meals?: A Lot Help from another person taking care of personal grooming?: A Lot Help from another person toileting, which includes using toliet, bedpan, or urinal?: Total Help from another person bathing (including washing, rinsing, drying)?: Total Help from another person to put on and taking off regular upper body clothing?: A Lot Help from another person to put on and taking off regular lower body clothing?: Total 6 Click Score: 9    End of Session    OT Visit Diagnosis: Muscle weakness (generalized) (M62.81);History of falling (Z91.81);Feeding  difficulties (R63.3);Other symptoms and signs involving cognitive function   Activity Tolerance Patient tolerated treatment well   Patient Left in bed;with call bell/phone within reach;with family/visitor present   Nurse Communication Mobility status        Time: 1610-9604 OT Time Calculation (min): 25 min  Charges: OT General Charges $OT Visit: 1 Visit OT Treatments $Self Care/Home Management : 23-37 mins  Ivor Messier, OT  Acute Rehabilitation Services Office [phone] Secure chat preferred   [name] 03/20/2023, 1:11 PM

## 2023-03-20 NOTE — Progress Notes (Addendum)
 Speech Language Pathology Treatment: Dysphagia  Patient Details Name: Sharon Ramos MRN: [id number] DOB: Jun 04, 1940 Today's Date: 03/20/2023 Time: 1050-1106 SLP Time Calculation (min) (ACUTE ONLY): 16 min  Assessment / Plan / Recommendation Clinical Impression  Pt demonstrates improved ability to cough on command and increase the subjective efficacy and strength of her cough with cues via ASL interpreter. Daughter agrees to repeat MBS prior to d/c to determine least restrictive diet in setting of improved secretion management and attention. Plan is for MBS at 1130 tomorrow with ASL interpreter present. Will need to be timely with this test as we have a narrow window of availability with radiology and interpreter.    HPI HPI: Sharon HOPPES is an 83 y.o. 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  CT of the knee showed comminuted periprosthetic distal femur fracture, orthopedic surgery completed on 3/4.      SLP Plan  Continue with current plan of care      Recommendations for follow up therapy are one component of a multi-disciplinary discharge planning process, led by the attending physician.  Recommendations may be updated based on patient status, additional functional criteria and insurance authorization.    Recommendations  Diet recommendations: Dysphagia 1 (puree);Honey-thick liquid Liquids provided via: Cup;Teaspoon Medication Administration: Via alternative means Supervision: Patient able to self feed;Full supervision/cueing for compensatory strategies Compensations: Slow rate Postural Changes and/or Swallow Maneuvers: Seated upright 90 degrees                  Oral care QID           Continue with current plan of care     Sharon Ramos,  Sharon Ramos  03/20/2023, 12:00 PM

## 2023-03-20 NOTE — Progress Notes (Signed)
 Nutrition Follow-up  DOCUMENTATION CODES:   Obesity unspecified  INTERVENTION:  Nocturnal TF via Cortrak: Osmolite 1.5 @ 65 ml/hr for 9 hours (2000-0500) 60 ml Prosource TF BID  90 ml free water flush every 4 hours Tube feeding regimen provides 958 kcal (53% of needs), 57 g protein (57% of needs), and 805 ml total water (TF + FWF) Continue Banatrol BID  Monitor po intake on dysphagia 1 diet; advance per SLP recommendations- MBSS planned for 03/21/23 to determine least restrictive diet for discharge Continue Magic cup TID with meals, each supplement provides 290 kcal and 9 grams of protein Continue MVI with minerals daily Continue vitamin C and zinc as ordered Continue 100 mg thiamine daily as ordered  NUTRITION DIAGNOSIS:   Inadequate oral intake related to inability to eat, dysphagia as evidenced by NPO status.  Ongoing  GOAL:   Patient will meet greater than or equal to 90% of their needs  Progressing   MONITOR:   Diet advancement, TF tolerance  REASON FOR ASSESSMENT:   Consult Assessment of nutrition requirement/status  ASSESSMENT:   Pt with medical history significant of hypertension, CKD 4, anxiety, hallucination, delusion, Alzheimer's dementia, anemia, deaf, mute, OAB, obesity presenting with multiple falls and knee pain. Found to have right distal femur fracture  3/3- s/p Open reduction internal fixation of right supracondylar distal femur fracture  3/6- s/p BSE- recommend NPO with nutrition via alternative means 3/7: cortrak placed 3/11: diet advanced to dysphagia 1 with honey thick liquids 3/12: MBS. Dysphagia is severe. Continue current diet per SLP  Reviewed I/O's: +290 ml x 24 hours and +6.8 L since 03/06/23  Pt unavailable at time of visit. Attempted to speak with pt via call to hospital room phone, however, unable to reach.  Per orthopedics notes, possible plan to remove sutures tomorrow (03/21/23).   RD discussed case with RN, who reports pt with  adequate oral intake. Documented meal completions 50-100%.   Per SLP notes, plan to repeat MBSS tomorrow to determine least restrictive diet in the setting of improved secretions management and attention.   Pt remains with cortrak tube on nocturnal feedings. Pt tolerating well. MD concerned about pt's aspiration risk and palliative care has been consulted to discussed goals of care with family. RD will continue TF for now given limited diet and aspiration risk.   Wt has been stable over the past week.   Medications reviewed and include vitamin C, vitamin D3, lovenox, and zinc sulfate.   Labs reviewed: CBGS: 94-110 (inpatient orders for glycemic control are none).    Diet Order:   Diet Order             DIET - DYS 1 Fluid consistency: Honey Thick  Diet effective now                   EDUCATION NEEDS:   Not appropriate for education at this time  Skin:  Skin Assessment: Skin Integrity Issues: Skin Integrity Issues:: Stage II, Incisions Stage II: sacrum Incisions: closed rt leg  Last BM:  3/18 (via rectal tube)  Height:   Ht Readings from Last 1 Encounters:  03/05/23 5\' 9"  (1.753 m)    Weight:   Wt Readings from Last 1 Encounters:  03/20/23 117.3 kg    Ideal Body Weight:  65.9 kg  BMI:  Body mass index is 38.19 kg/m.  Estimated Nutritional Needs:   Kcal:  1800-2000  Protein:  100-115 grams  Fluid:  1.8-2.0 L    Carlie Solorzano  W, RD, LDN, CDCES Registered Dietitian III Certified Diabetes Care and Education Specialist If unable to reach this RD, please use "RD Inpatient" group chat on secure chat between hours of 8am-4 pm daily

## 2023-03-21 ENCOUNTER — Inpatient Hospital Stay (HOSPITAL_COMMUNITY)

## 2023-03-21 ENCOUNTER — Ambulatory Visit: Payer: Medicare HMO | Admitting: Physician Assistant

## 2023-03-21 DIAGNOSIS — Z515 Encounter for palliative care: Secondary | ICD-10-CM

## 2023-03-21 DIAGNOSIS — S72401A Unspecified fracture of lower end of right femur, initial encounter for closed fracture: Secondary | ICD-10-CM | POA: Diagnosis not present

## 2023-03-21 DIAGNOSIS — Z7189 Other specified counseling: Secondary | ICD-10-CM

## 2023-03-21 LAB — GLUCOSE, CAPILLARY
Glucose-Capillary: 107 mg/dL — ABNORMAL HIGH (ref 70–99)
Glucose-Capillary: 113 mg/dL — ABNORMAL HIGH (ref 70–99)
Glucose-Capillary: 127 mg/dL — ABNORMAL HIGH (ref 70–99)
Glucose-Capillary: 91 mg/dL (ref 70–99)
Glucose-Capillary: 92 mg/dL (ref 70–99)

## 2023-03-21 MED ORDER — BANATROL TF EN LIQD
60.0000 mL | Freq: Two times a day (BID) | ENTERAL | Status: DC
  Administered 2023-03-22: 60 mL via ORAL
  Filled 2023-03-21: qty 60

## 2023-03-21 NOTE — Progress Notes (Signed)
 Orthopaedic Trauma Progress Note  SUBJECTIVE: Patient doing fairly well, sleepy. Didn't sleep very well last night per daughter. Only using scheduled Tylenol for pain. Has been working with therapies, making some progress but has not been able to stand. Imaging of the left knee done on 03/20/23, no new injuries noted. Knee prosthesis appears stable.   OBJECTIVE:  Vitals:   03/21/23 0325 03/21/23 0744  BP: 112/60 (!) 115/51  Pulse: (!) 57   Resp: 18 18  Temp: 97.6 F (36.4 C) 97.6 F (36.4 C)  SpO2: 95%     General: Resting in bed, no acute distress.   Respiratory: No increased work of breathing.  Right lower extremity: Incisions are clean, dry, intact.  Sutures removed. No significant tenderness with palpation throughout the thigh or calf.  Ankle dorsiflexion/plantarflexion intact.  Motor and sensory function intact distally.  2+ DP pulse  IMAGING: Repeat imaging right knee/femur performed 03/19/23 stable. No signs of any hardware failure or loosening.  Fracture remains in appropriate alignment.  LABS:  Results for orders placed or performed during the hospital encounter of 03/04/23 (from the past 24 hours)  Glucose, capillary     Status: Abnormal   Collection Time: 03/20/23 12:12 PM  Result Value Ref Range   Glucose-Capillary 110 (H) 70 - 99 mg/dL  Glucose, capillary     Status: Abnormal   Collection Time: 03/20/23  4:37 PM  Result Value Ref Range   Glucose-Capillary 111 (H) 70 - 99 mg/dL  Glucose, capillary     Status: Abnormal   Collection Time: 03/20/23  8:10 PM  Result Value Ref Range   Glucose-Capillary 121 (H) 70 - 99 mg/dL  Glucose, capillary     Status: Abnormal   Collection Time: 03/21/23 12:09 AM  Result Value Ref Range   Glucose-Capillary 107 (H) 70 - 99 mg/dL  Glucose, capillary     Status: None   Collection Time: 03/21/23  4:10 AM  Result Value Ref Range   Glucose-Capillary 92 70 - 99 mg/dL  Glucose, capillary     Status: None   Collection Time: 03/21/23   7:47 AM  Result Value Ref Range   Glucose-Capillary 91 70 - 99 mg/dL    ASSESSMENT: Sharon Ramos is a 83 y.o. female, 16 Days Post-Op s/p OPEN REDUCTION INTERNAL FIXATION RIGHT DISTAL FEMUR FRACTURE  CV/Blood loss: Acute blood loss anemia, Hgb 7.4 on 03/20/23. Hemodynamically stable  PLAN: Weightbearing: WBAT RLE ROM: Okay for knee motion as tolerated Incisional and dressing care: Okay to leave incisions open to air Showering: Okay to allow incisions to get wet when showering  Orthopedic device(s): None  Pain management: Tylenol, Dilaudid, Robaxin  VTE prophylaxis: Lovenox, SCDs ID:  Ancef 2gm post op completed Foley/Lines:  No foley, KVO IVFs Impediments to Fracture Healing: Vitamin D level low normal at 33, started on supplementation  Dispo: PT/OT evaluation ongoing, plan is for patient to d/c home with [facility].   Discharge recommendations:  - Tylenol as needed for pain control - Eliquis 2.5 mg twice daily x 30 days for DVT prophylaxis - Continue 1000 units vitamin D3 supplementation daily x 90 days  Follow - up plan: 2 weeks after discharge for wound check and repeat x-rays   Contact information:  Truitt Merle MD, Thyra Breed PA-C. After hours and holidays please check [email or web address] for group call information for [facility]   Thompson Caul, PA-C [phone] (office) [email or web address]

## 2023-03-21 NOTE — Progress Notes (Signed)
 Modified Barium Swallow Study  Patient Details  Name: Sharon Ramos MRN: [id number] Date of Birth: 1940-06-10  Today's Date: 03/21/2023  Modified Barium Swallow completed.  Full report located under Chart Review in the Imaging Section.  History of Present Illness Sharon Ramos is an 83 y.o. 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  CT of the knee showed comminuted periprosthetic distal femur fracture, orthopedic surgery completed on 3/4.   Clinical Impression Sharon Ramos presents with stable dysphagia, similar in function to prior exam with silent aspiraiton of all liquid textures. However, pt has improved in several indirect ways; she is now fully alert and attentive and trying to follow commands with ASL interpreter to the best of her ability. Her cognition is still a barrier to   consistent use of compensatory strategies, but with maximal verbal and visual cues pt was able to use a second swallow to clear oral residue in 100% of trials. Pt was less successful in following instructions to cough. She often signed the word cough or only phonated. It was difficult for her to comprehend what an explosive and effective cough should feel like, since she cant hear the model. Pts oral hygiene has also improved; she did not have any standing oral secretions prior to testing and secretions mixing with residue was not a problem during the exams today. Otherwise, pt still has delayed hyoid excursion with penetration prior to UES opening and aspiration of penetrate during and after the swallow iwthout sensation. Pt had a very late cough when a significant quantity of aspirate descended into the trachea. Cough still did not fully eject aspirate. Pt had the least severity of  penetration or aspiration with teaspoons of honey thick liquids.   Advised her daughter that there is risk of aspiration with any and all oral intake and thickened liquids ultimately may not be a good long term solution. If pt is aspirating regardless, clear water with good oral care is advisable and would also increase pts QOL and decrease risk of dehydration due to refusal of adequate amount of thickened fluids. For now daughter can continue honey thick from a spoon, Minced and moist solids (IDDSI level 5) and encourage pt to swallow twice and cough as much as possible. Pt reprotedly has been eating a good quantity of meals and is likely ready to advance to an oral only diet.   Factors that may increase risk of adverse event in presence of aspiration [employer] 2021): Frail or deconditioned;Reduced cognitive function;Poor general health and/or compromised immunity;Dependence for feeding and/or oral hygiene;Inadequate oral hygiene;Weak cough;Frequent aspiration of large volumes;Aspiration of thick, dense, and/or acidic materials  Swallow Evaluation Recommendations Recommendations: PO diet PO Diet Recommendation: Dysphagia 2 (Finely chopped);Moderately thick liquids (Level 3, honey thick) Liquid Administration via: Spoon Medication Administration: Crushed with puree Supervision: Full assist for feeding;Full supervision/cueing for swallowing strategies Swallowing strategies  : Follow solids with liquids;Hard cough after swallowing;Multiple dry swallows after each bite/sip Postural changes: Position pt fully upright for meals Oral care recommendations: Oral care QID (4x/day)      Casidy Alberta, Riley Nearing 03/21/2023,12:45 PM

## 2023-03-21 NOTE — Progress Notes (Signed)
 Physical Therapy Treatment Patient Details Name: Sharon Ramos MRN: [id number] DOB: 12-29-1940 Today's Date: 03/21/2023   History of Present Illness Patient is a 83 year old female presenting 3/2 with  ground-level fall with a right supracondylar periprosthetic distal femur fracture. S/p ORIF 3/3.  History of hypertension, CKD 4, anxiety, hallucination, Alzheimer's dementia, anemia, deaf, mute, OAB, and obesity.    PT Comments  Attempts made to schedule in-house interpreter for session, but unsuccessful due to schedule conflicts with interpreters. Daughter provided interpretation this session. Pt needs extra time to process and initiate simple, multi-modal cues, but did attempt to assist more with bed mobility and sitting balance this date. She required min-modA to roll and maxA to transition supine to sit EOB. She continues to require total assist to transition sit to supine and with minimally clearing her buttocks from EOB with attempts at standing this date. She participated well in seated LAQ exercises, but displays gross strength bil that impacts her independence with mobility. Will continue to follow acutely. Noted some black eschar at her sacral wound. Notified MD and discussed hydrotherapy option with MD.    If plan is discharge home, recommend the following: Two people to help with walking and/or transfers;Two people to help with bathing/dressing/bathroom;Assistance with cooking/housework;Assistance with feeding;Direct supervision/assist for medications management;Direct supervision/assist for financial management;Assist for transportation;Help with stairs or ramp for entrance;Supervision due to cognitive status   Can travel by private vehicle     No  Equipment Recommendations  Hoyer lift;Hospital bed;Wheelchair (measurements PT);Wheelchair cushion (measurements PT);BSC/3in1;Other (comment) (air mattress overlay)    Recommendations for Other Services       Precautions /  Restrictions Precautions Precautions: Fall Recall of Precautions/Restrictions: Impaired Precaution/Restrictions Comments: Cortrak; Rectal Tube Restrictions Weight Bearing Restrictions Per Provider Order: Yes RLE Weight Bearing Per Provider Order: Weight bearing as tolerated     Mobility  Bed Mobility Overal bed mobility: Needs Assistance Bed Mobility: Supine to Sit, Sit to Supine, Rolling Rolling: Min assist, Mod assist   Supine to sit: HOB elevated, Max assist, Used rails Sit to supine: Total assist   General bed mobility comments: Tactile and gestural cues utilized to roll bil for pericare and linen change, pt holding and grabbing onto therapist then rails to roll, min-modA bil. Daughter provided interpretation to cue pt to move each leg towards and off R EOB and then grab onto therapist to pull trunk up to sit R EOB, maxA needed at trunk and legs and bed pad to pivot hips to sit pt up. Cues provided to lean onto her R elbow to descend her trunk to transition to supine, total assist needed to guide trunk and lift legs and pivot body to return to supine.    Transfers Overall transfer level: Needs assistance Equipment used: 1 person hand held assist Transfers: Sit to/from Stand Sit to Stand: Total assist, From elevated surface           General transfer comment: Knee block provided and cues provided for pt to pull up on therapist anterior to her to try to lift her buttocks from elevated bed surface, total assist needed to successfully minimally clear buttocks x2 reps    Ambulation/Gait               General Gait Details: Deferred   Stairs             Wheelchair Mobility     Tilt Bed    Modified Rankin (Stroke Patients Only)  Balance Overall balance assessment: Needs assistance Sitting-balance support: Feet supported, No upper extremity supported Sitting balance-Leahy Scale: Fair Sitting balance - Comments: With hips squared with EOB, pt able to  sit statically EOB with CGA-supervision, but needed up to minA for dynamic sitting balance   Standing balance support: Bilateral upper extremity supported, During functional activity Standing balance-Leahy Scale: Zero Standing balance comment: Total assist needed to minimally clear buttocks from bed surface with HHA, x2 reps                            Communication Communication Communication: Impaired Factors Affecting Communication: Hearing impaired;Difficulty expressing self (daughter provided ASL interpretation as unable to get in-house interpreter scheduled with attempts made earlier in day)  Cognition Arousal: Alert Behavior During Therapy: Flat affect   PT - Cognitive impairments: Difficult to assess Difficult to assess due to: Hard of hearing/deaf                     PT - Cognition Comments: Daughter, Olegario Messier, provided ASL interpretation, pt verbalizing 1x only asking for water, follows simple multi-modal cues with extra time Following commands: Impaired Following commands impaired: Follows one step commands with increased time    Cueing Cueing Techniques: Verbal cues, Gestural cues, Tactile cues, Visual cues  Exercises General Exercises - Lower Extremity Long Arc Quad: AROM, Strengthening, Both, 10 reps, Seated    General Comments        Pertinent Vitals/Pain Pain Assessment Pain Assessment: Faces Faces Pain Scale: Hurts little more Pain Location: R LE with movement Pain Descriptors / Indicators: Grimacing, Guarding, Moaning, Discomfort, Operative site guarding Pain Intervention(s): Limited activity within patient's tolerance, Monitored during session, Repositioned    Home Living                          Prior Function            PT Goals (current goals can now be found in the care plan section) Acute Rehab PT Goals Patient Stated Goal: did not state PT Goal Formulation: With patient/family Time For Goal Achievement:  04/04/23 Potential to Achieve Goals: Fair Progress towards PT goals: Progressing toward goals    Frequency    Min 2X/week      PT Plan      Co-evaluation              AM-PAC PT "6 Clicks" Mobility   Outcome Measure  Help needed turning from your back to your side while in a flat bed without using bedrails?: A Lot Help needed moving from lying on your back to sitting on the side of a flat bed without using bedrails?: A Lot Help needed moving to and from a bed to a chair (including a wheelchair)?: Total Help needed standing up from a chair using your arms (e.g., wheelchair or bedside chair)?: Total Help needed to walk in hospital room?: Total Help needed climbing 3-5 steps with a railing? : Total 6 Click Score: 8    End of Session Equipment Utilized During Treatment: Gait belt;Oxygen Activity Tolerance: Patient tolerated treatment well Patient left: in bed;with call bell/phone within reach;with bed alarm set;with family/visitor present   PT Visit Diagnosis: Muscle weakness (generalized) (M62.81);Unsteadiness on feet (R26.81);Other abnormalities of gait and mobility (R26.89);Difficulty in walking, not elsewhere classified (R26.2)     Time: 6295-2841 PT Time Calculation (min) (ACUTE ONLY): 34 min  Charges:    $  Therapeutic Exercise: 8-22 mins $Therapeutic Activity: 8-22 mins PT General Charges $$ ACUTE PT VISIT: 1 Visit                     Virgil Benedict, PT, DPT Acute Rehabilitation Services  Office: [phone]    Bettina Gavia 03/21/2023, 1:57 PM

## 2023-03-21 NOTE — Progress Notes (Signed)
 PROGRESS NOTE    Sharon Ramos  VHQ:[id number] DOB: 1940/03/27 DOA: 03/04/2023 PCP: Sharon Shove, DO   Brief Narrative:  83 y.o. 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  CT of the knee showed comminuted periprosthetic distal femur fracture, orthopedic surgery was consulted, later did not  recommended surgical intervention.   Assessment & Plan:   Principal Problem:   Closed fracture of right distal femur (HCC) Active Problems:   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)   Anemia of chronic disease   Chronic kidney disease, stage 4 (severe) (HCC)   Generalized anxiety disorder   Mutism   Sensorineural hearing loss (SNHL) of both ears   Severe obesity (HCC)  Closed fracture of right distal femur (HCC) Secondary to mechanical falls. Orthopedic surgery was consulted recommended status post surgical intervention on 03/06/2023, narcotics per [facility] surgery.sutures removed 03/21/2023.  Ortho recommends Eliquis twice a day for 30 days for DVT prophylaxis. Continue 1000 units vitamin D3 supplementation daily x 90 days .  Acute respiratory failure with hypoxia due to pulmonary edema/acute on chronic diastolic congestive heart failure/aspiration pneumonia: On the night of 03/14/2023, patient was moaning, she was given Dilaudid, post pain medication, patient started having shortness of breath and became hypoxic requiring 6 L of oxygen.  Chest x-ray showed vascular congestion.  No infiltrates.  Started on IV Lasix and eventually she was weaned to room air. Ordered echo which showed normal ejection fraction and grade 2 diastolic dysfunction.  However yet again on the late afternoon 03/17/2023, patient developed respiratory distress, requiring nonrebreather, rapid  response was called.  Chest x-ray was repeated, this once again showed some pulmonary edema but more so right lower lobe infiltrate.  Procalcitonin 29.32,  with patient having history of aspiration and on tube feeds, this is highly likely a true aspiration pneumonia so started on Unasyn.  Patient was made NPO.  Reassessed by SLP and started on dysphagia 1 diet on 03/19/2023.  Eventually she is weaned to room air on the evening of 03/18/2023.  Still with mild right lower and middle lobe rhonchi.  Antibiotics transitioned to Augmentin today.   Acute metabolic encephalopathy: Likely secondary to below sepsis with PNA.  Held Robaxin and melatonin. CT of the head reviewed, neg Mentation now much improved, conversing and following commands   Sepsis secondary to aspiration pneumonia, not present on admission:  On 3/6, p t had increased lethargy, with fevers up to 101.53F as of 3/6 and tachypnea Ordered and reviewed CXR, findings of L>R patchy basilar airspace disease with repeat CXR demonstrating worse changes. SARS-CoV-2 influenza and RSV PCR negative.  Patient improved on azithromycin and Rocephin and she has completed the course as well.  However she developed pneumonia again on 03/17/2023, please see details above.   Essential hypertension: Blood pressure on the low side.  Will hold Norvasc for now.   New acute kidney injury on chronic kidney disease stage IIIb-patient does not have CKD stage IV: Last creatinine in chart is from December 2022 which was 2.09 with GFR of 24.  However prior to that, creatinine was around 1.5-1.7 making it CKD stage IIIb.  Patient's creatinine upon arrival was at baseline but rose and peaked at 3.19 on 03/06/2023.  Creatinine improved to 1.9 but rising again and 2.3 yesterday, awaiting today's labs,  Lasix discontinued and will avoid nephrotoxic agents.   Hyperkalemia: Resolved   Anxiety/delusion/auditory elucidation/Alzheimer's dementia: Continue Lexapro and Namenda.    OAB: Continue oxybutynin.   Morbid obesity: Weight loss and diet modification counseled.   Hypernatremia Resolved.  Sacral decubitus ulcer present on admission stage II: RN Pressure Injury Documentation:        Pressure Injury 03/04/23 Other (Comment) Medial Stage 2 -  Partial thickness loss of dermis presenting as a shallow open injury with a red, pink wound bed without slough. (Active)  03/04/23 2000  Location: Other (Comment)  Location Orientation: Medial  Staging: Stage 2 -  Partial thickness loss of dermis presenting as a shallow open injury with a red, pink wound bed without slough.  Wound Description (Comments):   Present on Admission: Yes  Dressing Type Foam - Lift dressing to assess site every shift 03/06/23 1930    Acute blood loss anemia -Likely secondary to recent surgery -improved after 1 unit PRBC transfusion.  Remained stable for several days and down to 7.4 today.  No reports of melena, hematochezia or hematemesis.  No other signs of bleeding.  Will repeat CBC at 5 PM.  Transfuse if less than 7 hemoglobin.   FEN/dysphagia: -Patient maintained on tube feeds, SLP following, started on dysphagia 1 diet 03/13/2023.  Slow weaning from tube feeds per dietitian and SLP.  Underwent MBS 03/14/2023.  Tube feed changed to nocturnal only on 03/16/2023.  Plan to repeat MBS today.  GOC/poor prognosis: Due to patient's poor prognosis, on 03/18/2023 I had lengthy discussion with patient's son at the bedside.  We discussed the difference between DNR and full code.  I recommended switching her to DNR but he wanted her to be full code.  I encouraged him to have frank discussion among the siblings and consider DNR status.  I also touched the topic of possible PEG tube placement if they want to continue to be aggressive because patient appears to have recurrent aspiration, I have consulted palliative care to have detailed GOC conversation with the patient's family. Today 03/19/2023, I was able  to meet her daughter Sharon Ramos who is the youngest of all the siblings but she is the Education officer, environmental.  I repeated similar discussion with her.  She wanted to take some time and think over this.  I made her aware that palliative care will be seeing patient and talking to her as well.  03/20/2023: Since patient has improved somewhat, daughter wants to keep her full code and wants to continue aggressive care at the moment.  She was made aware that palliative care may be visiting her soon as well.  DVT prophylaxis: enoxaparin (LOVENOX) injection 30 mg Start: 03/18/23 1600 SCDs Start: 03/05/23 1647 SCDs Start: 03/04/23 1751   Code Status: Full Code  Family Communication: Daughter present at the bedside.  Status is: Inpatient: still tube feed dependent    Estimated body mass index is 38.19 kg/m as calculated from the following:   Height as of this encounter: 5\' 9"  (1.753 m).   Weight as of this encounter: 117.3 kg.  Pressure Injury 03/04/23 Other (Comment) Medial Stage 2 -  Partial thickness loss of dermis presenting as a shallow open injury with a red, pink wound bed without slough. (Active)  03/04/23 2000  Location: Other (Comment) (intergluteal cleft)  Location Orientation: Medial  Staging: Stage 2 -  Partial thickness loss of dermis presenting as a shallow open injury with a red, pink wound bed without slough.  Wound Description (Comments):  Present on Admission: Yes  Dressing Type Foam - Lift dressing to assess site every shift 03/20/23 2010   Nutritional Assessment: Body mass index is 38.19 kg/m.Marland Kitchen Seen by dietician.  I agree with the assessment and plan as outlined below: Nutrition Status: Nutrition Problem: Inadequate oral intake Etiology: inability to eat, dysphagia Signs/Symptoms: NPO status Interventions: Tube feeding  . Skin Assessment: I have examined the patient's skin and I agree with the wound assessment as performed by the wound care RN as outlined  below: Pressure Injury 03/04/23 Other (Comment) Medial Stage 2 -  Partial thickness loss of dermis presenting as a shallow open injury with a red, pink wound bed without slough. (Active)  03/04/23 2000  Location: Other (Comment) (intergluteal cleft)  Location Orientation: Medial  Staging: Stage 2 -  Partial thickness loss of dermis presenting as a shallow open injury with a red, pink wound bed without slough.  Wound Description (Comments):   Present on Admission: Yes  Dressing Type Foam - Lift dressing to assess site every shift 03/20/23 2010    Consultants:  Orthopedics  Procedures:  As above  Antimicrobials:  Anti-infectives (From admission, onward)    Start     Dose/Rate Route Frequency Ordered Stop   03/21/23 1000  amoxicillin-clavulanate (AUGMENTIN) 500-125 MG per tablet 1 tablet        1 tablet Oral 2 times daily 03/20/23 1401     03/18/23 0830  Ampicillin-Sulbactam (UNASYN) 3 g in sodium chloride 0.9 % 100 mL IVPB        3 g 200 mL/hr over 30 Minutes Intravenous Every 12 hours 03/18/23 0742 03/20/23 2043   03/12/23 1215  azithromycin (ZITHROMAX) 500 mg in sodium chloride 0.9 % 250 mL IVPB  Status:  Discontinued        500 mg 250 mL/hr over 60 Minutes Intravenous  Once 03/12/23 1115 03/12/23 1116   03/08/23 1400  cefTRIAXone (ROCEPHIN) 2 g in sodium chloride 0.9 % 100 mL IVPB        2 g 200 mL/hr over 30 Minutes Intravenous Daily 03/08/23 1333 03/12/23 1100   03/08/23 1400  azithromycin (ZITHROMAX) 500 mg in sodium chloride 0.9 % 250 mL IVPB        500 mg 250 mL/hr over 60 Minutes Intravenous Daily 03/08/23 1333 03/12/23 1400   03/05/23 2100  ceFAZolin (ANCEF) IVPB 2g/100 mL premix        2 g 200 mL/hr over 30 Minutes Intravenous Every 8 hours 03/05/23 1646 03/06/23 1552   03/05/23 1317  vancomycin (VANCOCIN) powder  Status:  Discontinued          As needed 03/05/23 1317 03/05/23 1426   03/05/23 1030  ceFAZolin (ANCEF) IVPB 2g/100 mL premix        2 g 200 mL/hr over 30  Minutes Intravenous On call to O.R. 03/05/23 0937 03/05/23 1310         Subjective: Patient seen and examined.  Daughter at the bedside helping with the sign language.  Patient feels much better.  No complaints.  Objective: Vitals:   03/20/23 1215 03/20/23 1925 03/21/23 0325 03/21/23 0744  BP: (!) 111/53 (!) 113/50 112/60 (!) 115/51  Pulse: 67 65 (!) 57   Resp: 18 15 18 18   Temp: 97.6 F (36.4 C) (!) 97.3 F (36.3 C) 97.6 F (36.4 C) 97.6 F (36.4 C)  TempSrc: Oral Oral Oral Oral  SpO2: 100% 98% 95%   Weight:      Height:  Intake/Output Summary (Last 24 hours) at 03/21/2023 0803 Last data filed at 03/21/2023 0434 Gross per 24 hour  Intake 1059.94 ml  Output 900 ml  Net 159.94 ml   Filed Weights   03/18/23 0339 03/19/23 0500 03/20/23 0500  Weight: 118.2 kg 119.4 kg 117.3 kg    Examination:  General exam: Appears calm and comfortable  Respiratory system: Faint bilateral rhonchi at the base and right middle lobe. Respiratory effort normal. Cardiovascular system: S1 & S2 heard, RRR. No JVD, murmurs, rubs, gallops or clicks. No pedal edema. Gastrointestinal system: Abdomen is nondistended, soft and nontender. No organomegaly or masses felt. Normal bowel sounds heard. Central nervous system: Alert. No focal neurological deficits. Skin: No rashes, lesions or ulcers.    Data Reviewed: I have personally reviewed following labs and imaging studies  CBC: Recent Labs  Lab 03/16/23 0605 03/18/23 0218 03/19/23 0820 03/19/23 1646 03/20/23 0547  WBC 13.5* 22.8* 9.8 8.8 7.6  NEUTROABS 9.0* 19.8* 8.0* 7.2 5.7  HGB 8.3* 8.4* 7.5* 7.7* 7.4*  HCT 26.4* 26.5* 24.1* 25.1* 24.1*  MCV 93.3 91.7 92.0 92.3 93.1  PLT 322 332 308 338 319   Basic Metabolic Panel: Recent Labs  Lab 03/16/23 0605 03/17/23 1832 03/18/23 0218 03/18/23 1602 03/19/23 0820 03/20/23 0547  NA 137 137 141  --  140 144  K 4.5 4.9 5.5* 4.9 4.5 4.0  CL 107 102 104  --  105 105  CO2 25 23 22   --   25 24  GLUCOSE 109* 120* 131*  --  106* 93  BUN 76* 100* 102*  --  104* 101*  CREATININE 1.87* 1.98* 2.39*  --  2.33* 2.23*  CALCIUM 8.3* 8.3* 8.2*  --  7.7* 8.0*  MG  --  2.0  --   --  2.2  --    GFR: Estimated Creatinine Clearance: 26.6 mL/min (A) (by C-G formula based on SCr of 2.23 mg/dL (H)). Liver Function Tests: No results for input(s): "AST", "ALT", "ALKPHOS", "BILITOT", "PROT", "ALBUMIN" in the last 168 hours.  No results for input(s): "LIPASE", "AMYLASE" in the last 168 hours. No results for input(s): "AMMONIA" in the last 168 hours. Coagulation Profile: No results for input(s): "INR", "PROTIME" in the last 168 hours. Cardiac Enzymes: No results for input(s): "CKTOTAL", "CKMB", "CKMBINDEX", "TROPONINI" in the last 168 hours. BNP (last 3 results) No results for input(s): "PROBNP" in the last 8760 hours. HbA1C: No results for input(s): "HGBA1C" in the last 72 hours. CBG: Recent Labs  Lab 03/20/23 1637 03/20/23 2010 03/21/23 0009 03/21/23 0410 03/21/23 0747  GLUCAP 111* 121* 107* 92 91   Lipid Profile: No results for input(s): "CHOL", "HDL", "LDLCALC", "TRIG", "CHOLHDL", "LDLDIRECT" in the last 72 hours. Thyroid Function Tests: No results for input(s): "TSH", "T4TOTAL", "FREET4", "T3FREE", "THYROIDAB" in the last 72 hours.  Anemia Panel: No results for input(s): "VITAMINB12", "FOLATE", "FERRITIN", "TIBC", "IRON", "RETICCTPCT" in the last 72 hours. Sepsis Labs: Recent Labs  Lab 03/17/23 1832 03/18/23 0218  PROCALCITON  --  29.32  LATICACIDVEN 1.5  --      No results found for this or any previous visit (from the past 240 hours).    Radiology Studies: DG Knee AP/LAT W/Sunrise Left Result Date: 03/20/2023 CLINICAL DATA:  Left knee swelling. EXAM: LEFT KNEE 3 VIEWS COMPARISON:  Left knee radiographs 10/08/2013 FINDINGS: Redemonstration of total left knee arthroplasty. No perihardware lucency is seen to indicate hardware failure or loosening. Large body  habitus, similar to prior. No definite joint effusion.  There are numerous (at least 13) mineralized loose bodies seen posterior to the medial knee compartment, measuring up to 13 mm. These are similar to prior and again are likely within a Baker's cyst. No acute fracture or dislocation. IMPRESSION: 1. 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 2. Numerous (at least 13) mineralized loose bodies seen posterior to the medial knee compartment, measuring up to 13 mm. These are similar to prior and again are likely within a Baker's cyst. Electronically Signed   By: Neita Garnet M.D.   On: 03/20/2023 11:22   DG FEMUR, MIN 2 VIEWS RIGHT Result Date: 03/19/2023 CLINICAL DATA:  Closed femur fracture.  Follow-up. EXAM: RIGHT FEMUR 2 VIEWS COMPARISON:  Radiographs 03/05/2023 and 03/04/2023.  CT 03/04/2023. FINDINGS: The hardware is intact without loosening status post lateral plate and screw fixation of the comminuted distal femur fracture. Posterior displacement of the fracture on the lateral view may be slightly increased, now measuring 1.5 cm. No interval fracture healing identified. Stable postsurgical changes from previous right total knee arthroplasty. Stable moderate degenerative changes at the right hip. IMPRESSION: Possible slight increase in posterior displacement of the comminuted distal femur fracture status post ORIF. No evidence of hardware loosening or interval fracture healing. Electronically Signed   By: Carey Bullocks M.D.   On: 03/19/2023 14:35    Scheduled Meds:  acetaminophen  650 mg Oral Q6H   amoxicillin-clavulanate  1 tablet Oral BID   ascorbic acid  500 mg Oral BID   cholecalciferol  1,000 Units Oral Daily   dorzolamide-timolol  1 drop Both Eyes BID   enoxaparin (LOVENOX) injection  30 mg Subcutaneous Q24H   escitalopram  10 mg Oral Daily   feeding supplement (PROSource TF20)  60 mL Per Tube BID   fiber supplement (BANATROL TF)  60 mL Per Tube BID   free water   150 mL Per Tube Q4H   Gerhardt's butt cream   Topical BID   memantine  10 mg Oral BID   multivitamin with minerals  1 tablet Oral Daily   oxybutynin  5 mg Oral TID   pneumococcal 20-valent conjugate vaccine  0.5 mL Intramuscular Tomorrow-1000   zinc sulfate (50mg  elemental zinc)  220 mg Oral Daily   Continuous Infusions:  feeding supplement (OSMOLITE 1.5 CAL) Stopped (03/17/23 0500)     LOS: 17 days   Hughie Closs, MD [facility]  03/21/2023, 8:03 AM   *Please note that this is a verbal dictation therefore any spelling or grammatical errors are due to the "[employer]agon [employer] One" system interpretation.  Please page via Amion and do not message via secure chat for urgent patient care matters. Secure chat can be used for non urgent patient care matters.  How to contact the [facility] Attending or Consulting provider 7A - 7P or covering provider during after hours 7P -7A, for this patient?  Check the care team in [facility] and look for a) attending/consulting [facility] provider listed and b) the [facility] team listed. Page or secure chat 7A-7P. Log into [email or web address] and use [facility] universal password to access. If you do not have the password, please contact the hospital operator. Locate the [facility] provider you are looking for under [facility] and page to a number that you can be directly reached. If you still have difficulty reaching the provider, please page the [facility] (Director on Call) for the Hospitalists listed on amion for assistance.

## 2023-03-21 NOTE — Consult Note (Signed)
 Palliative Medicine Inpatient Consult Note  Consulting Provider: Hughie Closs, MD   Reason for consult:   Palliative Care Consult Services Palliative Medicine Consult  Reason for Consult? GOC   03/21/2023  HPI:  Per intake H&P --> 83 y.o. 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  CT of the knee showed comminuted periprosthetic distal femur fracture, orthopedic surgery was consulted, later did not  recommended surgical intervention.   Palliative care asked to support additional goals of care conversations.   Clinical Assessment/Goals of Care:  *Please note that this is a verbal dictation therefore any spelling or grammatical errors are due to the "[employer] One" system interpretation.  Interpretation services utilized through [employer] interpreter, Valentino Saxon [phone]  I have reviewed medical records including EPIC notes, labs and imaging, received report from bedside RN, assessed the patient who is lying in bed, sleepy we tried to communicate with the interpretation service though patient was not able to utilize sign language due to lethargy.    I met with patient's daughter,Sharon Ramos to further discuss diagnosis prognosis, GOC, EOL wishes, disposition and options.   I introduced Palliative Medicine as specialized medical care for people living with serious illness. It focuses on providing relief from the symptoms and stress of a serious illness. The goal is to improve quality of life for both the patient and the family.  Medical History Review and Understanding:  A review of Sharon Ramos's past medical history inclusive of hypertension, chronic kidney disease, Alzheimer's dementia, deaf, and recent femur fracture was completed.  Social History:  Sharon Ramos is from  [place], [place].  She is widowed though was happily married for 53 years.  She is 1 of 9 brothers and sisters.  She has 6 children, 10 grandchildren, 3 great-grandchildren, and 1 great great grandchild.  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  She went for the school of the [employer] in [place] where she met her husband.  They moved to [facility] to be closer to his family where she has resided since.  She worked at [employer] within [employer].  Functional and Nutritional State:  Patient's daughter Sharon Ramos shares that Hensley was living on her own preceding hospitalization. Sharon Ramos shares that she does help Sharon Ramos when needed.  She notes that she is planning to live with her full-time after this hospitalization.  Advance Directives:  A detailed discussion was had today regarding advanced directives.  Sharon Ramos is checking with family to see if they have advanced directives.  Code Status:  Concepts specific to code status, artifical feeding and hydration, continued IV antibiotics and rehospitalization was had.  The difference between a aggressive medical intervention path  and a palliative comfort care path for this patient at this time was had.   Discussed cardiopulmonary resuscitation and intubation.  Patient's daughter, Sharon Ramos does not think she would desire intubation though shares she wants to maintain full CODE STATUS at this time.  Provided her with a copy of the MOST form for review and completion.  Discussion:  Discussed patient's prolonged over 2-week hospitalization in the setting of a right distal femur fracture requiring surgical intervention.  Reviewed patient's underlying history of Alzheimer's dementia for which she follows up with Dr. Jacqualin Combes and the impacts anesthesia has had on her since surgical repair.  Reviewed  delirium as,  a sudden and serious change in mental abilities characterized by confused thinking, reduced awareness of surroundings,  and a fluctuating course, often developing over hours or days.  I shared unfortunately patients with underlying dementia are more at risk for this.  Discussed patient's dysphagia.  Patient's daughter feels this is likely related to her tobacco chewing.  Discussed if this were to continue considerations for the future. Patient's daughter shared that she is not worried about a gastrostomy tube as her mother has been eating and drinking her meals quite well.  Reviewed the differences between palliative care and hospice care as below:  Palliative care is specialized medical care for people living with a serious illness, such as cancer, heart failure, COPD, Alzheimer's dementia, etc. Patients in palliative care may receive medical care for their symptoms, or palliative care, along with treatment intended to cure their serious illness   Hospice care focuses on the care, comfort, and quality of life of a person with a serious illness who is approaching the end of life. At some point, it may not be possible to cure a serious illness, or a patient may choose not to undergo certain treatments. Hospice is designed for this situation.  At this time patient's daughter has the goal of her mother being able to have the core track removed.  She shares the hope to take her home so as to decrease further delirium.    Sharon Ramos is open to outpatient palliative support upon discharge.  Discussed the importance of continued conversation with family and their  medical providers regarding overall plan of care and treatment options, ensuring decisions are within the context of the patients values and GOCs.  Decision Maker: Sharon, Ramos (Daughter): [phone] (Home Phone)   SUMMARY OF RECOMMENDATIONS   Full Code/ Full scope of Care  Discussed patient's delirium and with the progression of the disease looks like  Reviewed the differences between palliative care and hospice care  Appreciate [facility] arranging palliative  care upon discharge  Patient's daughter shares the goal of the core track being removed and patient going home with home health services  Ongoing palliative care support  Code Status/Advance Care Planning: FULL CODE  Palliative Prophylaxis:  Aspiration, Bowel Regimen, Delirium Protocol, Frequent Pain Assessment, Oral Care, Palliative Wound Care, and Turn Reposition  Additional Recommendations (Limitations, Scope, Preferences): Continue current care  Psycho-social/Spiritual:  Desire for further Chaplaincy support: Yes Additional Recommendations: Education on Palliative supportive care.   Prognosis: Unclear - has high disease burden and has endured a prolonged hospitalization.   Discharge Planning: To home with [facility] once medically optimized.   Vitals:   03/21/23 0325 03/21/23 0744  BP: 112/60 (!) 115/51  Pulse: (!) 57   Resp: 18 18  Temp: 97.6 F (36.4 C) 97.6 F (36.4 C)  SpO2: 95%     Intake/Output Summary (Last 24 hours) at 03/21/2023 0931 Last data filed at 03/21/2023 0434 Gross per 24 hour  Intake 1059.94 ml  Output 900 ml  Net 159.94 ml   Last Weight  Most recent update: 03/20/2023  5:26 AM    Weight  117.3 kg (258 lb 9.6 oz)            Gen: Elderly African-American female in no acute distress HEENT: Core track, moist mucous membranes CV: Regular rate and rhythm  PULM: On room air breathing is even and nonlabored ABD: soft/nontender EXT: No edema Neuro: Somnolent  PPS: 30%   This conversation/these recommendations were discussed with patient primary care  team, Dr. Jacqulyn Bath  Billing based on MDM: High ______________________________________________________ Sharon Ramos [facility] Palliative Medicine Team Team Cell Phone: [phone] Please utilize secure chat with additional questions, if there is no response within 30 minutes please call the above phone number  Palliative Medicine Team providers are available by phone from 7am to 7pm daily and  can be reached through the team cell phone.  Should this patient require assistance outside of these hours, please call the patient's attending physician.

## 2023-03-22 DIAGNOSIS — S72401A Unspecified fracture of lower end of right femur, initial encounter for closed fracture: Secondary | ICD-10-CM | POA: Diagnosis not present

## 2023-03-22 LAB — CBC WITH DIFFERENTIAL/PLATELET
Abs Immature Granulocytes: 0.07 10*3/uL (ref 0.00–0.07)
Basophils Absolute: 0 10*3/uL (ref 0.0–0.1)
Basophils Relative: 0 %
Eosinophils Absolute: 0.2 10*3/uL (ref 0.0–0.5)
Eosinophils Relative: 3 %
HCT: 24.7 % — ABNORMAL LOW (ref 36.0–46.0)
Hemoglobin: 7.3 g/dL — ABNORMAL LOW (ref 12.0–15.0)
Immature Granulocytes: 1 %
Lymphocytes Relative: 14 %
Lymphs Abs: 1 10*3/uL (ref 0.7–4.0)
MCH: 28.4 pg (ref 26.0–34.0)
MCHC: 29.6 g/dL — ABNORMAL LOW (ref 30.0–36.0)
MCV: 96.1 fL (ref 80.0–100.0)
Monocytes Absolute: 0.6 10*3/uL (ref 0.1–1.0)
Monocytes Relative: 8 %
Neutro Abs: 5.1 10*3/uL (ref 1.7–7.7)
Neutrophils Relative %: 74 %
Platelets: 319 10*3/uL (ref 150–400)
RBC: 2.57 MIL/uL — ABNORMAL LOW (ref 3.87–5.11)
RDW: 19.7 % — ABNORMAL HIGH (ref 11.5–15.5)
WBC: 7 10*3/uL (ref 4.0–10.5)
nRBC: 0.3 % — ABNORMAL HIGH (ref 0.0–0.2)

## 2023-03-22 LAB — GLUCOSE, CAPILLARY
Glucose-Capillary: 76 mg/dL (ref 70–99)
Glucose-Capillary: 85 mg/dL (ref 70–99)
Glucose-Capillary: 89 mg/dL (ref 70–99)
Glucose-Capillary: 93 mg/dL (ref 70–99)
Glucose-Capillary: 94 mg/dL (ref 70–99)
Glucose-Capillary: 94 mg/dL (ref 70–99)
Glucose-Capillary: 95 mg/dL (ref 70–99)

## 2023-03-22 LAB — BASIC METABOLIC PANEL
Anion gap: 6 (ref 5–15)
BUN: 78 mg/dL — ABNORMAL HIGH (ref 8–23)
CO2: 26 mmol/L (ref 22–32)
Calcium: 8.1 mg/dL — ABNORMAL LOW (ref 8.9–10.3)
Chloride: 112 mmol/L — ABNORMAL HIGH (ref 98–111)
Creatinine, Ser: 2.08 mg/dL — ABNORMAL HIGH (ref 0.44–1.00)
GFR, Estimated: 23 mL/min — ABNORMAL LOW (ref >60)
Glucose, Bld: 93 mg/dL (ref 70–99)
Potassium: 3.9 mmol/L (ref 3.5–5.1)
Sodium: 144 mmol/L (ref 135–145)

## 2023-03-22 MED ORDER — ORAL CARE MOUTH RINSE: 15.0000 mL | OROMUCOSAL | Status: DC | PRN

## 2023-03-22 NOTE — Progress Notes (Signed)
 Nutrition Follow-up  DOCUMENTATION CODES:   Obesity unspecified  INTERVENTION:   -D/c cortrak, TF, banatrol, and feeding assistance -Continue dysphagia 2 diet with honey thick liquids -Continue Magic cup TID with meals, each supplement provides 290 kcal and 9 grams of protein  -Continue 500 mg vitamin C BID -Continue 100 mg thiamine daily  NUTRITION DIAGNOSIS:   Inadequate oral intake related to inability to eat, dysphagia as evidenced by NPO status.  Progressing; advanced to PO diet to 03/13/23  GOAL:   Patient will meet greater than or equal to 90% of their needs  Progressing   MONITOR:   Diet advancement, TF tolerance  REASON FOR ASSESSMENT:   Consult Assessment of nutrition requirement/status  ASSESSMENT:   Pt with medical history significant of hypertension, CKD 4, anxiety, hallucination, delusion, Alzheimer's dementia, anemia, deaf, mute, OAB, obesity presenting with multiple falls and knee pain. Found to have right distal femur fracture  3/3- s/p Open reduction internal fixation of right supracondylar distal femur fracture  3/6- s/p BSE- recommend NPO with nutrition via alternative means 3/7: cortrak placed 3/11: diet advanced to dysphagia 1 with honey thick liquids 3/12: MBS. Dysphagia is severe. Continue current diet per SLP 3/19- s/p MBSS- dysphagia 2, honey thick liquids  Reviewed I/O's: -180 ml x 24 hours and +6.4 L since 03/08/23  UOP: 700 ml x 24 hours  Case discussed with RN and MD. Pt continues to eat well. Pt has met with palliative care and does not think she needs a feeding tube due to eating well. Documented meal completions 50-100%. Per MD and RN, plan to remove cortrak tube today.   Per TOC notes, plan to d/c to SNF once medically stable.   Medications reviewed and include vitamin C, vitamin D, and lovenox.    Labs reviewed: Phos: 2.0, CBGS: 76-127 (inpatient orders for glycemic control are none).    Diet Order:   Diet Order              DIET DYS 2 Fluid consistency: Honey Thick  Diet effective now                   EDUCATION NEEDS:   Not appropriate for education at this time  Skin:  Skin Assessment: Skin Integrity Issues: Skin Integrity Issues:: Stage II, Incisions Stage II: sacrum Incisions: closed rt leg  Last BM:  03/22/23 (via rectal tube- type 7)  Height:   Ht Readings from Last 1 Encounters:  03/05/23 5\' 9"  (1.753 m)    Weight:   Wt Readings from Last 1 Encounters:  03/22/23 113.5 kg    Ideal Body Weight:  65.9 kg  BMI:  Body mass index is 36.95 kg/m.  Estimated Nutritional Needs:   Kcal:  1800-2000  Protein:  100-115 grams  Fluid:  1.8-2.0 L    Levada Schilling, RD, LDN, CDCES Registered Dietitian III Certified Diabetes Care and Education Specialist If unable to reach this RD, please use "RD Inpatient" group chat on secure chat between hours of 8am-4 pm daily

## 2023-03-22 NOTE — Progress Notes (Signed)
 PROGRESS NOTE    Sharon Ramos  GUY:[id number] DOB: 11/06/1940 DOA: 03/04/2023 PCP: Theodis Shove, DO   Brief Narrative:  83 y.o. 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  CT of the knee showed comminuted periprosthetic distal femur fracture, orthopedic surgery was consulted, later did not  recommended surgical intervention.   Assessment & Plan:   Principal Problem:   Closed fracture of right distal femur (HCC) Active Problems:   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)   Anemia of chronic disease   Chronic kidney disease, stage 4 (severe) (HCC)   Generalized anxiety disorder   Mutism   Sensorineural hearing loss (SNHL) of both ears   Severe obesity (HCC)  Closed fracture of right distal femur (HCC) Secondary to mechanical falls. Orthopedic surgery was consulted recommended status post surgical intervention on 03/06/2023, narcotics per BTK surgery.sutures removed 03/21/2023.  Ortho recommends Eliquis twice a day for 30 days for DVT prophylaxis. Continue 1000 units vitamin D3 supplementation daily x 90 days .  Acute respiratory failure with hypoxia due to pulmonary edema/acute on chronic diastolic congestive heart failure/aspiration pneumonia: On the night of 03/14/2023, patient was moaning, she was given Dilaudid, post pain medication, patient started having shortness of breath and became hypoxic requiring 6 L of oxygen.  Chest x-ray showed vascular congestion.  No infiltrates.  Started on IV Lasix and eventually she was weaned to room air. Ordered echo which showed normal ejection fraction and grade 2 diastolic dysfunction.  However yet again on the late afternoon 03/17/2023, patient developed respiratory distress, requiring nonrebreather, rapid  response was called.  Chest x-ray was repeated, this once again showed some pulmonary edema but more so right lower lobe infiltrate.  Procalcitonin 29.32,  with patient having history of aspiration and on tube feeds, this is highly likely a true aspiration pneumonia so started on Unasyn.  Patient was made NPO.  Reassessed by SLP and started on dysphagia 1 diet on 03/19/2023.  Eventually she was weaned to room air on the evening of 03/18/2023.  Transition to oral Augmentin 03/21/2023.  Lungs are sounding better and she is feeling well.   Acute metabolic encephalopathy: Likely secondary to below sepsis with PNA.  Held Robaxin and melatonin. CT of the head reviewed, neg Mentation now much improved, conversing and following commands   Sepsis secondary to aspiration pneumonia, not present on admission:  On 3/6, p t had increased lethargy, with fevers up to 101.71F as of 3/6 and tachypnea Ordered and reviewed CXR, findings of L>R patchy basilar airspace disease with repeat CXR demonstrating worse changes. SARS-CoV-2 influenza and RSV PCR negative.  Patient improved on azithromycin and Rocephin and she has completed the course as well.  However she developed pneumonia again on 03/17/2023, please see details above.   Essential hypertension: Blood pressure on the low side.  Will hold Norvasc for now.   New acute kidney injury on chronic kidney disease stage IIIb-patient does not have CKD stage IV: Last creatinine in chart is from December 2022 which was 2.09 with GFR of 24.  However prior to that, creatinine was around 1.5-1.7 making it CKD stage IIIb.  Patient's creatinine upon arrival was at baseline but rose and peaked at 3.19 on 03/06/2023.  Creatinine improved to 1.9 but rising again and 2.3 yesterday, awaiting today's labs,  Lasix discontinued and will avoid nephrotoxic agents.   Hyperkalemia: Resolved   Anxiety/delusion/auditory elucidation/Alzheimer's dementia: Continue Lexapro and Namenda.    OAB: Continue oxybutynin.   Morbid obesity: Weight loss and diet modification counseled.   Hypernatremia Resolved.  Sacral decubitus ulcer present on admission stage II: RN Pressure Injury Documentation:        Pressure Injury 03/04/23 Other (Comment) Medial Stage 2 -  Partial thickness loss of dermis presenting as a shallow open injury with a red, pink wound bed without slough. (Active)  03/04/23 2000  Location: Other (Comment)  Location Orientation: Medial  Staging: Stage 2 -  Partial thickness loss of dermis presenting as a shallow open injury with a red, pink wound bed without slough.  Wound Description (Comments):   Present on Admission: Yes  Dressing Type Foam - Lift dressing to assess site every shift 03/06/23 1930    Acute blood loss anemia -Likely secondary to recent surgery -improved after 1 unit PRBC transfusion.  Remained stable for several days and down to 7.4 today.  No reports of melena, hematochezia or hematemesis.  No other signs of bleeding.  Will repeat CBC at 5 PM.  Transfuse if less than 7 hemoglobin.   FEN/dysphagia: -Patient maintained on tube feeds, SLP following, started on dysphagia 1 diet 03/13/2023.  Slow weaning from tube feeds per dietitian and SLP.  Underwent MBS 03/14/2023.  Tube feed changed to nocturnal only on 03/16/2023 and eventually stopped on 03/17/2023.  Repeat MBS was completed 03/21/2023, diet advanced to dysphagia 2.  Per daughter, patient's appetite is improving.  I do not see any note from dietitian for very long time.  I have consulted dietitian, have asked the nurse to reach out to them.  We need them to do the calorie count and let us know if it is time for [place] to remove cortrak.   GOC/poor prognosis: Due to patient's poor prognosis, on 03/18/2023 I had lengthy discussion with patient's son at the bedside.  We discussed the difference between DNR and full code.  I recommended switching her to DNR but he wanted her to be full code.  I encouraged  him to have frank discussion among the siblings and consider DNR status.  I also touched the topic of possible PEG tube placement if they want to continue to be aggressive because patient appears to have recurrent aspiration, I have consulted palliative care to have detailed GOC conversation with the patient's family. Today 03/19/2023, I was able to meet her daughter Alexismarie Flaim who is the youngest of all the siblings but she is the Education officer, environmental.  I repeated similar discussion with her.  She wanted to take some time and think over this.  I made her aware that palliative care will be seeing patient and talking to her as well.  03/20/2023: Since patient has improved somewhat, daughter wants to keep her full code and wants to continue aggressive care at the moment.  She was made aware that palliative care may be visiting her soon as well.  Disposition: The medical team was under the impression that plan was to send her to SNF however yesterday we found out that daughter wants her to go home instead.  And today, daughter has changed her mind and wants to go to SNF.  TOC has been informed.  If dietitian recommends removing tube, patient will be medically stable for discharge.  DVT prophylaxis: enoxaparin (LOVENOX) injection 30 mg Start: 03/18/23 1600 SCDs Start: 03/05/23 1647 SCDs Start: 03/04/23 1751  Code Status: Full Code  Family Communication: Daughter present at the bedside.  Status is: Inpatient: still with cortrak   Estimated body mass index is 36.95 kg/m as calculated from the following:   Height as of this encounter: 5\' 9"  (1.753 m).   Weight as of this encounter: 113.5 kg.  Pressure Injury 03/04/23 Other (Comment) Medial Stage 2 -  Partial thickness loss of dermis presenting as a shallow open injury with a red, pink wound bed without slough. (Active)  03/04/23 2000  Location: Other (Comment) (intergluteal cleft)  Location Orientation: Medial  Staging: Stage 2 -  Partial thickness  loss of dermis presenting as a shallow open injury with a red, pink wound bed without slough.  Wound Description (Comments):   Present on Admission: Yes  Dressing Type Foam - Lift dressing to assess site every shift 03/21/23 1955   Nutritional Assessment: Body mass index is 36.95 kg/m.Marland Kitchen Seen by dietician.  I agree with the assessment and plan as outlined below: Nutrition Status: Nutrition Problem: Inadequate oral intake Etiology: inability to eat, dysphagia Signs/Symptoms: NPO status Interventions: Tube feeding  . Skin Assessment: I have examined the patient's skin and I agree with the wound assessment as performed by the wound care RN as outlined below: Pressure Injury 03/04/23 Other (Comment) Medial Stage 2 -  Partial thickness loss of dermis presenting as a shallow open injury with a red, pink wound bed without slough. (Active)  03/04/23 2000  Location: Other (Comment) (intergluteal cleft)  Location Orientation: Medial  Staging: Stage 2 -  Partial thickness loss of dermis presenting as a shallow open injury with a red, pink wound bed without slough.  Wound Description (Comments):   Present on Admission: Yes  Dressing Type Foam - Lift dressing to assess site every shift 03/21/23 1955    Consultants:  Orthopedics  Procedures:  As above  Antimicrobials:  Anti-infectives (From admission, onward)    Start     Dose/Rate Route Frequency Ordered Stop   03/21/23 1000  amoxicillin-clavulanate (AUGMENTIN) 500-125 MG per tablet 1 tablet        1 tablet Oral 2 times daily 03/20/23 1401     03/18/23 0830  Ampicillin-Sulbactam (UNASYN) 3 g in sodium chloride 0.9 % 100 mL IVPB        3 g 200 mL/hr over 30 Minutes Intravenous Every 12 hours 03/18/23 0742 03/20/23 2043   03/12/23 1215  azithromycin (ZITHROMAX) 500 mg in sodium chloride 0.9 % 250 mL IVPB  Status:  Discontinued        500 mg 250 mL/hr over 60 Minutes Intravenous  Once 03/12/23 1115 03/12/23 1116   03/08/23 1400   cefTRIAXone (ROCEPHIN) 2 g in sodium chloride 0.9 % 100 mL IVPB        2 g 200 mL/hr over 30 Minutes Intravenous Daily 03/08/23 1333 03/12/23 1100   03/08/23 1400  azithromycin (ZITHROMAX) 500 mg in sodium chloride 0.9 % 250 mL IVPB        500 mg 250 mL/hr over 60 Minutes Intravenous Daily 03/08/23 1333 03/12/23 1400   03/05/23 2100  ceFAZolin (ANCEF) IVPB 2g/100 mL premix        2 g 200 mL/hr over 30 Minutes Intravenous Every 8 hours 03/05/23 1646 03/06/23 1552   03/05/23 1317  vancomycin (VANCOCIN) powder  Status:  Discontinued          As needed 03/05/23 1317 03/05/23 1426   03/05/23 1030  ceFAZolin (ANCEF) IVPB 2g/100 mL premix  2 g 200 mL/hr over 30 Minutes Intravenous On call to O.R. 03/05/23 4098 03/05/23 1310         Subjective: Seen and examined, daughter at the bedside helping with the sign language.  Patient is very comfortable, per daughter patient has no complaints at all.  Objective: Vitals:   03/22/23 0018 03/22/23 0430 03/22/23 0500 03/22/23 0739  BP: (!) 124/53 (!) 128/51  (!) 114/58  Pulse: (!) 57 (!) 57  (!) 55  Resp: 19 18  18   Temp: (!) 97.5 F (36.4 C) 97.9 F (36.6 C)  97.8 F (36.6 C)  TempSrc: Oral Oral  Axillary  SpO2: 96% 98%  98%  Weight:   113.5 kg   Height:        Intake/Output Summary (Last 24 hours) at 03/22/2023 1109 Last data filed at 03/21/2023 1741 Gross per 24 hour  Intake 520 ml  Output 700 ml  Net -180 ml   Filed Weights   03/19/23 0500 03/20/23 0500 03/22/23 0500  Weight: 119.4 kg 117.3 kg 113.5 kg    Examination:  General exam: Appears calm and comfortable  Respiratory system: Clear to auscultation.  Poor inspiratory effort. Cardiovascular system: S1 & S2 heard, RRR. No JVD, murmurs, rubs, gallops or clicks. No pedal edema. Gastrointestinal system: Abdomen is nondistended, soft and nontender. No organomegaly or masses felt. Normal bowel sounds heard. Central nervous system: Alert No focal neurological  deficits. Extremities: Symmetric 5 x 5 power. Skin: No rashes, lesions or ulcers.    Data Reviewed: I have personally reviewed following labs and imaging studies  CBC: Recent Labs  Lab 03/18/23 0218 03/19/23 0820 03/19/23 1646 03/20/23 0547 03/22/23 0422  WBC 22.8* 9.8 8.8 7.6 7.0  NEUTROABS 19.8* 8.0* 7.2 5.7 5.1  HGB 8.4* 7.5* 7.7* 7.4* 7.3*  HCT 26.5* 24.1* 25.1* 24.1* 24.7*  MCV 91.7 92.0 92.3 93.1 96.1  PLT 332 308 338 319 319   Basic Metabolic Panel: Recent Labs  Lab 03/17/23 1832 03/18/23 0218 03/18/23 1602 03/19/23 0820 03/20/23 0547 03/22/23 0422  NA 137 141  --  140 144 144  K 4.9 5.5* 4.9 4.5 4.0 3.9  CL 102 104  --  105 105 112*  CO2 23 22  --  25 24 26   GLUCOSE 120* 131*  --  106* 93 93  BUN 100* 102*  --  104* 101* 78*  CREATININE 1.98* 2.39*  --  2.33* 2.23* 2.08*  CALCIUM 8.3* 8.2*  --  7.7* 8.0* 8.1*  MG 2.0  --   --  2.2  --   --    GFR: Estimated Creatinine Clearance: 28 mL/min (A) (by C-G formula based on SCr of 2.08 mg/dL (H)). Liver Function Tests: No results for input(s): "AST", "ALT", "ALKPHOS", "BILITOT", "PROT", "ALBUMIN" in the last 168 hours.  No results for input(s): "LIPASE", "AMYLASE" in the last 168 hours. No results for input(s): "AMMONIA" in the last 168 hours. Coagulation Profile: No results for input(s): "INR", "PROTIME" in the last 168 hours. Cardiac Enzymes: No results for input(s): "CKTOTAL", "CKMB", "CKMBINDEX", "TROPONINI" in the last 168 hours. BNP (last 3 results) No results for input(s): "PROBNP" in the last 8760 hours. HbA1C: No results for input(s): "HGBA1C" in the last 72 hours. CBG: Recent Labs  Lab 03/21/23 1631 03/21/23 2011 03/22/23 0020 03/22/23 0424 03/22/23 0739  GLUCAP 113* 127* 94 93 94   Lipid Profile: No results for input(s): "CHOL", "HDL", "LDLCALC", "TRIG", "CHOLHDL", "LDLDIRECT" in the last 72 hours. Thyroid Function Tests:  No results for input(s): "TSH", "T4TOTAL", "FREET4", "T3FREE",  "THYROIDAB" in the last 72 hours.  Anemia Panel: No results for input(s): "VITAMINB12", "FOLATE", "FERRITIN", "TIBC", "IRON", "RETICCTPCT" in the last 72 hours. Sepsis Labs: Recent Labs  Lab 03/17/23 1832 03/18/23 0218  PROCALCITON  --  29.32  LATICACIDVEN 1.5  --      No results found for this or any previous visit (from the past 240 hours).    Radiology Studies: DG Swallowing Func-Speech Pathology Result Date: 03/21/2023 Table formatting from the original result was not included. Modified Barium Swallow Study Patient Details Name: Sharon Ramos MRN: [id number] Date of Birth: 1940-10-26 Today's Date: 03/21/2023 HPI/PMH: HPI: Sharon Ramos is an 83 y.o. 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  CT of the knee showed comminuted periprosthetic distal femur fracture, orthopedic surgery completed on 3/4. Clinical Impression: Ms. Kassa presents with stable dysphagia, similar in function to prior exam with silent aspiraiton of all liquid textures. However, pt has improved in several indirect ways; she is now fully alert and attentive and trying to follow commands with ASL interpreter to the best of her ability. Her cognition is still a barrier to   consistent use of compensatory strategies, but with maximal verbal and visual cues pt was able to use a second swallow to clear oral residue in 100% of trials. Pt was less successful in following instructions to cough. She often signed the word cough or only phonated. It was difficult for her to comprehend what an explosive and effective cough should feel like, since she cant hear the model. Pts oral hygiene has also improved; she did not have any standing oral secretions prior to testing and secretions mixing with residue  was not a problem during the exams today. Otherwise, pt still has delayed hyoid excursion with penetration prior to UES opening and aspiration of penetrate during and after the swallow iwthout sensation. Pt had a very late cough when a significant quantity of aspirate descended into the trachea. Cough still did not fully eject aspirate. Pt had the least severity of penetration or aspiration with teaspoons of honey thick liquids.  Advised her daughter that there is risk of aspiration with any and all oral intake and thickened liquids ultimately may not be a good long term solution. If pt is aspirating regardless, clear water with good oral care is advisable and would also increase pts QOL and decrease risk of dehydration due to refusal of adequate amount of thickened fluids. For now daughter can continue honey thick from a spoon, Minced and moist solids (IDDSI level 5) and encourage pt to swallow twice and cough as much as possible. Pt reprotedly has been eating a good quantity of meals and is likely ready to advance to an oral only diet. Factors that may increase risk of adverse event in presence of aspiration Sharon Ramos 2021): Factors that may increase risk of adverse event in presence of aspiration Sharon Ramos 2021): Frail or deconditioned; Reduced cognitive function; Poor general health and/or compromised immunity; Dependence for feeding and/or oral hygiene; Inadequate oral hygiene; Weak cough; Frequent aspiration of large volumes; Aspiration of thick, dense, and/or acidic materials Recommendations/Plan: Swallowing Evaluation Recommendations Swallowing Evaluation Recommendations Recommendations: PO diet PO Diet Recommendation: Dysphagia 2 (Finely chopped); Moderately thick liquids (Level 3, honey thick) Liquid  Administration via: Spoon Medication Administration: Crushed with puree Supervision: Full assist for feeding; Full supervision/cueing for swallowing strategies Swallowing strategies  : Follow  solids with liquids; Hard cough after swallowing; Multiple dry swallows after each bite/sip Postural changes: Position pt fully upright for meals Oral care recommendations: Oral care QID (4x/day) Treatment Plan Treatment Plan Treatment recommendations: Therapy as outlined in treatment plan below Follow-up recommendations: Home health SLP Functional status assessment: Patient has had a recent decline in their functional status and demonstrates the ability to make significant improvements in function in a reasonable and predictable amount of time. Treatment frequency: Min 2x/week Treatment duration: 2 weeks Interventions: Aspiration precaution training; Patient/family education; Compensatory techniques; Trials of upgraded texture/liquids; Diet toleration management by SLP Recommendations Recommendations for follow up therapy are one component of a multi-disciplinary discharge planning process, led by the attending physician.  Recommendations may be updated based on patient status, additional functional criteria and insurance authorization. Assessment: Orofacial Exam: Orofacial Exam Oral Cavity: Oral Hygiene: Xerostomia Oral Cavity - Dentition: Poor condition Orofacial Anatomy: WFL Oral Motor/Sensory Function: WFL Anatomy: Anatomy: WFL Boluses Administered: Boluses Administered Boluses Administered: Thin liquids (Level 0); Mildly thick liquids (Level 2, nectar thick); Moderately thick liquids (Level 3, honey thick); Puree; Solid  Oral Impairment Domain: Oral Impairment Domain Lip Closure: Escape beyond mid-chin Tongue control during bolus hold: Escape to lateral buccal cavity/floor of mouth Bolus preparation/mastication: Disorganized chewing/mashing with solid pieces of bolus unchewed Bolus transport/lingual motion: Slow tongue motion Oral residue: Residue collection on oral structures Location of oral residue : Tongue; Lateral sulci; Floor of mouth Initiation of pharyngeal swallow : Pyriform sinuses  Pharyngeal  Impairment Domain: Pharyngeal Impairment Domain Soft palate elevation: No bolus between soft palate (SP)/pharyngeal wall (PW) Laryngeal elevation: Complete superior movement of thyroid cartilage with complete approximation of arytenoids to epiglottic petiole Anterior hyoid excursion: Complete anterior movement Epiglottic movement: Complete inversion Laryngeal vestibule closure: Incomplete, narrow column air/contrast in laryngeal vestibule Pharyngeal stripping wave : Present - complete Pharyngeal contraction (A/P view only): Complete Pharyngoesophageal segment opening: Partial distention/partial duration, partial obstruction of flow Pharyngeal residue: Collection of residue within or on pharyngeal structures Location of pharyngeal residue: Valleculae; Tongue base; Pharyngeal wall  Esophageal Impairment Domain: Esophageal Impairment Domain Esophageal clearance upright position: Complete clearance, esophageal coating Pill: No data recorded Penetration/Aspiration Scale Score: Penetration/Aspiration Scale Score 1.  Material does not enter airway: Moderately thick liquids (Level 3, honey thick); Puree; Solid 8.  Material enters airway, passes BELOW cords without attempt by patient to eject out (silent aspiration) : Mildly thick liquids (Level 2, nectar thick); Thin liquids (Level 0); Moderately thick liquids (Level 3, honey thick) Compensatory Strategies: Compensatory Strategies Compensatory strategies: Yes Multiple swallows: Effective   General Information: Caregiver present: Yes  Diet Prior to this Study: Dysphagia 1 (pureed); Moderately thick liquids (Level 3, honey thick)   Temperature : Normal   Respiratory Status: WFL   Supplemental O2: None (Room air)   History of Recent Intubation: No  Behavior/Cognition: Alert; Cooperative Self-Feeding Abilities: Needs assist with self-feeding Baseline vocal quality/speech: Not observed Volitional Cough: Unable to elicit Volitional Swallow: Able to elicit No data recorded Goal  Planning: Prognosis for improved oropharyngeal function: Fair Barriers to Reach Goals: Cognitive deficits; Severity of deficits No data recorded No data recorded Consulted and agree with results and recommendations: Patient; Family member/caregiver Pain: Pain Assessment Pain Assessment: Faces Faces Pain Scale: 4 Pain Location: R LE with movement Pain Descriptors / Indicators: Grimacing Pain Intervention(s): Monitored during session; Repositioned End of  Session: Start Time:SLP Start Time (ACUTE ONLY): 1130 Stop Time: SLP Stop Time (ACUTE ONLY): 1213 Time Calculation:SLP Time Calculation (min) (ACUTE ONLY): 43 min Charges: SLP Evaluations $ SLP Speech Visit: 1 Visit SLP Evaluations $MBS Swallow: 1 Procedure $Swallowing Treatment: 1 Procedure SLP visit diagnosis: SLP Visit Diagnosis: Dysphagia, oropharyngeal phase (R13.12) Past Medical History: Past Medical History: Diagnosis Date  Auditory hallucinations 11/17/2020  History of kidney stones   Hypertension  Past Surgical History: Past Surgical History: Procedure Laterality Date  KNEE SURGERY    10-15 years ago  ORIF FEMUR FRACTURE Right 03/05/2023  Procedure: OPEN REDUCTION INTERNAL FIXATION (ORIF) DISTAL FEMUR FRACTURE;  Surgeon: Roby Lofts, MD;  Location: [facility] OR;  Service: Orthopedics;  Laterality: Right; [place], Riley Nearing 03/21/2023, 12:52 PM   Scheduled Meds:  acetaminophen  650 mg Oral Q6H   amoxicillin-clavulanate  1 tablet Oral BID   ascorbic acid  500 mg Oral BID   cholecalciferol  1,000 Units Oral Daily   dorzolamide-timolol  1 drop Both Eyes BID   enoxaparin (LOVENOX) injection  30 mg Subcutaneous Q24H   escitalopram  10 mg Oral Daily   feeding supplement (PROSource TF20)  60 mL Per Tube BID   fiber supplement (BANATROL TF)  60 mL Oral BID   free water  150 mL Per Tube Q4H   Gerhardt's butt cream   Topical BID   memantine  10 mg Oral BID   multivitamin with minerals  1 tablet Oral Daily   oxybutynin  5 mg Oral TID   pneumococcal  20-valent conjugate vaccine  0.5 mL Intramuscular Tomorrow-1000   Continuous Infusions:  feeding supplement (OSMOLITE 1.5 CAL) Stopped (03/17/23 0500)     LOS: 18 days   Hughie Closs, MD [facility]  03/22/2023, 11:09 AM   *Please note that this is a verbal dictation therefore any spelling or grammatical errors are due to the "Dragon Medical One" system interpretation.  Please page via Amion and do not message via secure chat for urgent patient care matters. Secure chat can be used for non urgent patient care matters.  How to contact the [facility] Attending or Consulting provider 7A - 7P or covering provider during after hours 7P -7A, for this patient?  Check the care team in [facility] and look for a) attending/consulting [facility] provider listed and b) the [facility] team listed. Page or secure chat 7A-7P. Log into [email or web address] and use [facility] universal password to access. If you do not have the password, please contact the hospital operator. Locate the [facility] provider you are looking for under [facility] and page to a number that you can be directly reached. If you still have difficulty reaching the provider, please page the [facility] [place] (Director on Call) for the Hospitalists listed on amion for assistance.

## 2023-03-22 NOTE — Progress Notes (Signed)
 Physical Therapy Treatment Patient Details Name: Sharon Ramos MRN: [id number] DOB: [birth year] Today's Date: 03/22/2023   History of Present Illness Patient is a 83 year old female presenting 3/2 with  ground-level fall with a right supracondylar periprosthetic distal femur fracture. S/p ORIF 3/3.  History of hypertension, CKD 4, anxiety, hallucination, Alzheimer's dementia, anemia, deaf, mute, OAB, and obesity.    PT Comments  Session focused on theract to promote functional mobility and transfers. Pt displayed improvements with initiation of bed mobility and increased the amount of time sitting EOB. Pt continues to have difficulty with bed mobility and transfers, particularly standing and requires a total assist of 2 and a Stedy to clear her bottom from an elevated bed surface but has not fully stood yet. Pt continues to have difficulty with functional mobility and transfers and would benefit from continued acute PT. Will continue to follow acutely.     If plan is discharge home, recommend the following: Two people to help with walking and/or transfers;Two people to help with bathing/dressing/bathroom;Assistance with cooking/housework;Assistance with feeding;Direct supervision/assist for medications management;Direct supervision/assist for financial management;Assist for transportation;Help with stairs or ramp for entrance;Supervision due to cognitive status   Can travel by private vehicle     No  Equipment Recommendations  Hoyer lift;Hospital bed;Wheelchair (measurements PT);Wheelchair cushion (measurements PT);BSC/3in1;Other (comment) (air mattress overlay)    Recommendations for Other Services       Precautions / Restrictions Precautions Precautions: Fall Recall of Precautions/Restrictions: Impaired Precaution/Restrictions Comments: Cortrak; Rectal Tube Restrictions Weight Bearing Restrictions Per Provider Order: Yes RLE Weight Bearing Per Provider Order: Weight bearing as  tolerated     Mobility  Bed Mobility Overal bed mobility: Needs Assistance Bed Mobility: Supine to Sit, Sit to Supine, Rolling Rolling: +2 for physical assistance, Used rails, Max assist   Supine to sit: HOB elevated, Used rails, Total assist, +2 for physical assistance Sit to supine: Total assist, +2 for physical assistance   General bed mobility comments: Pt requires total A for sit to supine for LE management, trunk control, and trunk positioning. Pt is total A x2 for helicopeter maneuver to EOB and total A for LE management bringing LE to EOB. Pt requires max-total A for rolling Bil (L>R) for pericare and repositioning in the bed EOS    Transfers Overall transfer level: Needs assistance Equipment used: 2 person hand held assist Transfers: Sit to/from Stand Sit to Stand: Total assist, +2 physical assistance, From elevated surface           General transfer comment: Attempted to have pt stand using Stedy and 2 person HHA, but pt was unable to fully stand. Pt was able to complete around 60-70% of stand x2 using 2 HHA and bed pads to promote hip extension Transfer via Lift Equipment: Stedy  Ambulation/Gait               General Gait Details: Deferred   Stairs             Wheelchair Mobility     Tilt Bed    Modified Rankin (Stroke Patients Only)       Balance Overall balance assessment: Needs assistance Sitting-balance support: Feet supported, No upper extremity supported Sitting balance-Leahy Scale: Fair Sitting balance - Comments: With hips squared at EOB, pt able to sit statically with CGA-supervision. Postural control: Left lateral lean, Posterior lean   Standing balance-Leahy Scale: Zero Standing balance comment: pt is a total assist to achieve around 60-70% of movement. Reliant on external support  Communication Communication Communication: Impaired Factors Affecting Communication: Hearing  impaired;Difficulty expressing self  Cognition Arousal: Alert Behavior During Therapy: Flat affect   PT - Cognitive impairments: Difficult to assess Difficult to assess due to: Hard of hearing/deaf                       Following commands: Impaired Following commands impaired: Follows one step commands with increased time    Cueing Cueing Techniques: Verbal cues, Gestural cues, Tactile cues, Visual cues  Exercises      General Comments        Pertinent Vitals/Pain Pain Assessment Pain Assessment: Faces Faces Pain Scale: Hurts little more Pain Location: R LE with movement Pain Descriptors / Indicators: Grimacing, Guarding, Moaning, Discomfort, Operative site guarding Pain Intervention(s): Monitored during session    Home Living                          Prior Function            PT Goals (current goals can now be found in the care plan section) Acute Rehab PT Goals Patient Stated Goal: did not state PT Goal Formulation: With patient/family Time For Goal Achievement: 04/04/23 Potential to Achieve Goals: Fair Progress towards PT goals: Progressing toward goals    Frequency    Min 2X/week      PT Plan      Co-evaluation              AM-PAC PT "6 Clicks" Mobility   Outcome Measure  Help needed turning from your back to your side while in a flat bed without using bedrails?: Total Help needed moving from lying on your back to sitting on the side of a flat bed without using bedrails?: Total Help needed moving to and from a bed to a chair (including a wheelchair)?: Total Help needed standing up from a chair using your arms (e.g., wheelchair or bedside chair)?: Total Help needed to walk in hospital room?: Total Help needed climbing 3-5 steps with a railing? : Total 6 Click Score: 6    End of Session Equipment Utilized During Treatment: Gait belt Activity Tolerance: Patient tolerated treatment well Patient left: in bed;with call  bell/phone within reach;with bed alarm set;with family/visitor present   PT Visit Diagnosis: Muscle weakness (generalized) (M62.81);Unsteadiness on feet (R26.81);Other abnormalities of gait and mobility (R26.89);Difficulty in walking, not elsewhere classified (R26.2)     Time: 8119-1478 PT Time Calculation (min) (ACUTE ONLY): 40 min  Charges:    $Therapeutic Activity: 38-52 mins PT General Charges $$ ACUTE PT VISIT: 1 Visit                    [address], SPT    Maria Antonia 03/22/2023, 3:31 PM

## 2023-03-22 NOTE — TOC Progression Note (Addendum)
 Transition of Care [facility]) - Progression Note    Patient Details  Name: Sharon Ramos MRN: [id number] Date of Birth: January 29, 1940  [facility] CM/SW Contact  Michaela Corner, [place] Phone Number: 03/22/2023, 11:04 AM  Clinical Narrative:   CSW spoke with patients dtr, Olegario Messier,  about bed offers for SNF. Olegario Messier chose [facility] at this time. Per MD, consulting RD to see if patient is getting adequate calories for potential cortrak removal. CSW will submit for insurance auth once cortrak is removed. CSW notified treatment team.   4:38 PM CSW submitted insurance auth for [facility]. Auth id: [id number]; Approval 03/23/2023-03/27/2023  4:48 PM CSW spoke with Olegario Messier, she stated she wants her mother to go to [facility] and not [facility]. CSW will contact [employer] to switch auth to [facility] at this time.   4:51 PM CSW spoke with Kenya at [employer] and successfully changed insurance auth to [facility]   TOC will continue to follow.   Expected Discharge Plan: Home w Home Health Services Barriers to Discharge: Continued Medical Work up, SNF Pending bed offer  Expected Discharge Plan and Services In-house Referral: Clinical Social Work Discharge Planning Services: CM Consult Post Acute Care Choice: Home Health, Durable Medical Equipment Living arrangements for the past 2 months: Single Family Home                 DME Arranged: Hospital bed, High strength lightweight manual wheelchair with seat cushion, Bedside commode (hoyer lift) DME Agency: [employer] Date DME Agency Contacted: 03/12/23 Time DME Agency Contacted: 1330 Representative spoke with at DME Agency: Vaughan Basta HH Arranged: PT, OT, Nurse's Aide HH Agency: [facility] Date [facility] Agency Contacted: 03/12/23 Time HH Agency Contacted: 1335 Representative spoke with at [facility] Agency: Kandee Keen   Social Determinants of Health (SDOH) Interventions SDOH Screenings   Food Insecurity: No Food Insecurity (03/04/2023)  Housing: Low Risk   (03/04/2023)  Transportation Needs: No Transportation Needs (03/04/2023)  Utilities: Not At Risk (03/04/2023)  Social Connections: Moderately Isolated (03/04/2023)  Tobacco Use: Low Risk  (03/05/2023)    Readmission Risk Interventions     No data to display

## 2023-03-22 NOTE — TOC Progression Note (Signed)
 Transition of Care [facility]) - Progression Note    Patient Details  Name: Sharon Ramos MRN: [id number] Date of Birth: Nov 04, 1940  [facility]) CM/SW Contact  Graves-Bigelow, Lamar Laundry, RN Phone Number: 03/22/2023, 10:42 AM  Clinical Narrative:  Case Manager spoke with daughter this morning and the plan for the patient is for SNF. DME had been previously ordered via [employer]- order for hospital bed and hoyer lift cancelled. Daughter has taken the wheelchair and bedside commode home. Frances Furbish will follow from the SNF. No further needs identified at this time.    Expected Discharge Plan: Skilled Nursing Facility Barriers to Discharge: Continued Medical Work up, SNF Pending bed offer  Expected Discharge Plan and Services In-house Referral: Clinical Social Work Discharge Planning Services: CM Consult Post Acute Care Choice: Home Health, Durable Medical Equipment Living arrangements for the past 2 months: Single Family Home                 DME Arranged: Hospital bed, High strength lightweight manual wheelchair with seat cushion, Bedside commode (hoyer lift) DME Agency: [employer] Date DME Agency Contacted: 03/12/23 Time DME Agency Contacted: 1330 Representative spoke with at DME Agency: Vaughan Basta HH Arranged: PT, OT, Nurse's Aide HH Agency: [facility] Date [facility] Agency Contacted: 03/12/23 Time HH Agency Contacted: 1335 Representative spoke with at [facility] Agency: Kandee Keen  Social Determinants of Health (SDOH) Interventions SDOH Screenings   Food Insecurity: No Food Insecurity (03/04/2023)  Housing: Low Risk  (03/04/2023)  Transportation Needs: No Transportation Needs (03/04/2023)  Utilities: Not At Risk (03/04/2023)  Social Connections: Moderately Isolated (03/04/2023)  Tobacco Use: Low Risk  (03/05/2023)    Readmission Risk Interventions     No data to display

## 2023-03-22 NOTE — Plan of Care (Signed)
  Problem: Clinical Measurements: Goal: Ability to maintain clinical measurements within normal limits will improve Outcome: Progressing   Problem: Nutrition: Goal: Adequate nutrition will be maintained Outcome: Progressing   Problem: Pain Managment: Goal: General experience of comfort will improve and/or be controlled Outcome: Progressing

## 2023-03-22 NOTE — Care Management Important Message (Signed)
 Important Message  Patient Details  Name: Sharon Ramos MRN: [id number] Date of Birth: 1940/12/21   Important Message Given:  Yes - Medicare IM     Renie Ora 03/22/2023, 1:10 PM

## 2023-03-22 NOTE — Progress Notes (Signed)
 Speech Language Pathology Treatment: Dysphagia  Patient Details Name: Sharon Ramos MRN: [id number] DOB: 12/03/1940 Today's Date: 03/22/2023 Time: 1130-1200 SLP Time Calculation (min) (ACUTE ONLY): 30 min  Assessment / Plan / Recommendation Clinical Impression  Pt demonstrates stable function. SLP reinforced MBS results. With help of interpreter, cued pt to swallow twice with max verbal cues and feedback on success rate. Pt seems to comprehend strategy. Pt may continue dys2/honey thick liquids with small sips of thin water or ice in between meals with two swallows. Continue efforts  HPI HPI: Sharon Ramos is an 83 y.o. 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  CT of the knee showed comminuted periprosthetic distal femur fracture, orthopedic surgery completed on 3/4.      SLP Plan  Continue with current plan of care      Recommendations for follow up therapy are one component of a multi-disciplinary discharge planning process, led by the attending physician.  Recommendations may be updated based on patient status, additional functional criteria and insurance authorization.    Recommendations  Diet recommendations: Dysphagia 2 (fine chop);Honey-thick liquid Liquids provided via: Teaspoon;Cup Medication Administration: Crushed with puree Supervision: Full supervision/cueing for compensatory strategies Compensations: Slow rate Postural Changes and/or Swallow Maneuvers: Seated upright 90 degrees                  Oral care QID   Frequent or constant Supervision/Assistance Dysphagia, oropharyngeal phase (R13.12)     Continue with current plan of care     Johncarlo Maalouf, Riley Nearing  03/22/2023, 12:43 PM

## 2023-03-23 LAB — GLUCOSE, CAPILLARY
Glucose-Capillary: 93 mg/dL (ref 70–99)
Glucose-Capillary: 82 mg/dL (ref 70–99)
Glucose-Capillary: 92 mg/dL (ref 70–99)

## 2023-03-23 MED ORDER — OXYBUTYNIN CHLORIDE 5 MG PO TABS
5.0000 mg | ORAL_TABLET | Freq: Three times a day (TID) | ORAL | 0 refills | Status: DC
Start: 2023-03-23 — End: 2023-04-20

## 2023-03-23 MED ORDER — VITAMIN D3 25 MCG PO TABS: 1000.0000 [IU] | ORAL_TABLET | Freq: Every day | ORAL | 2 refills | Status: AC

## 2023-03-23 MED ORDER — AMOXICILLIN-POT CLAVULANATE 500-125 MG PO TABS: 1.0000 | ORAL_TABLET | Freq: Two times a day (BID) | ORAL | 0 refills | Status: AC

## 2023-03-23 MED ORDER — DORZOLAMIDE HCL-TIMOLOL MAL 2-0.5 % OP SOLN: 1.0000 [drp] | Freq: Two times a day (BID) | OPHTHALMIC | 12 refills | Status: AC

## 2023-03-23 MED ORDER — APIXABAN 2.5 MG PO TABS: 2.5000 mg | ORAL_TABLET | Freq: Two times a day (BID) | ORAL | 0 refills | Status: DC

## 2023-03-23 MED ORDER — METHOCARBAMOL 1000 MG/10ML IJ SOLN: 500.0000 mg | Freq: Three times a day (TID) | INTRAMUSCULAR | 0 refills | Status: DC | PRN

## 2023-03-23 MED ORDER — CLONAZEPAM 0.5 MG PO TABS: 0.5000 mg | ORAL_TABLET | Freq: Every day | ORAL | 0 refills | Status: AC

## 2023-03-23 MED ORDER — METHOCARBAMOL 500 MG PO TABS: 500.0000 mg | ORAL_TABLET | Freq: Three times a day (TID) | ORAL | 0 refills | Status: AC | PRN

## 2023-03-23 NOTE — Discharge Summary (Signed)
 Physician Discharge Summary  Sharon Ramos UEA:[id number] DOB: 11-20-1940 DOA: 03/04/2023  PCP: Theodis Shove, DO  Admit date: 03/04/2023 Discharge date: 03/23/2023 30 Day Unplanned Readmission Risk Score    Flowsheet Row ED to Hosp-Admission (Current) from 03/04/2023 in [facility]  30 Day Unplanned Readmission Risk Score (%) 20.59 Filed at 03/23/2023 0801       This score is the patient's risk of an unplanned readmission within 30 days of being discharged (0 -100%). The score is based on dignosis, age, lab data, medications, orders, and past utilization.   Low:  0-14.9   Medium: 15-21.9   High: 22-29.9   Extreme: 30 and above          Admitted From: Home Disposition: SNF  Recommendations for Outpatient Follow-up:  Follow up with PCP in 1-2 weeks Please obtain BMP/CBC in one week Follow-up with orthopedics in 2 weeks Please follow up with your PCP on the following pending results: Unresulted Labs (From admission, onward)    None         Home Health: None Equipment/Devices: None  Discharge Condition: Stable CODE STATUS: Full code Diet recommendation: Dysphagia 2 diet with honey thick liquid/ Sips of thin water after oral care ok  Subjective: Seen and examined, daughter at the bedside helping with sign language.  Patient has no complaints and she appears very comfortable.  Brief/Interim Summary: 83 y.o. 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  CT of the knee showed comminuted periprosthetic distal femur fracture, orthopedic surgery was consulted, later did not  recommended surgical intervention.   Closed fracture of right distal femur (HCC) Secondary to mechanical falls. Orthopedic surgery was consulted, status post  surgical intervention on 03/06/2023, Ortho recommends Eliquis twice a day for 30 days for DVT prophylaxis however patient has remained in the hospital at least 2 weeks or slightly more since the surgery so prescribing 2 weeks worth of Eliquis. Continue 1000 units vitamin D3 supplementation daily x 90 days .   Acute respiratory failure with hypoxia due to pulmonary edema/acute on chronic diastolic congestive heart failure/aspiration pneumonia: On the night of 03/14/2023, patient was moaning, she was given Dilaudid, post pain medication, patient started having shortness of breath and became hypoxic requiring 6 L of oxygen.  Chest x-ray showed vascular congestion.  No infiltrates.  Started on IV Lasix and eventually she was weaned to room air. Ordered echo which showed normal ejection fraction and grade 2 diastolic dysfunction.  However yet again on the late afternoon 03/17/2023, patient developed respiratory distress, requiring nonrebreather, rapid response was called.  Chest x-ray was repeated, this once again showed some pulmonary edema but more so right lower lobe infiltrate.  Procalcitonin 29.32,  with patient having history of aspiration and on tube feeds, this appeared more aspiration pneumonia for which patient was started on Unasyn.  Patient was made NPO.  Reassessed by SLP and started on dysphagia 1 diet on 03/19/2023.  Eventually she was weaned to room air on the evening of 03/18/2023.  Transitioned to oral Augmentin 03/21/2023.  Lungs are sounding better and she is feeling well.  She has remained on room air for so many days and is being discharged on 4 more days of oral Augmentin.   Acute metabolic encephalopathy: Likely secondary to below sepsis with PNA.  Held Robaxin and melatonin. CT of the head reviewed, neg Mentation now much improved,    Sepsis secondary to aspiration pneumonia, not present on admission:  On 3/6, p t had increased lethargy, with fevers up to 101.69F as of 3/6 and  tachypnea Ordered and reviewed CXR, findings of L>R patchy basilar airspace disease with repeat CXR demonstrating worse changes. SARS-CoV-2 influenza and RSV PCR negative.  Patient improved on azithromycin and Rocephin and she has completed the course as well.  However she developed pneumonia again on 03/17/2023, please see details above.   Essential hypertension: Blood pressure on the low side.  Will hold Norvasc for now.   New acute kidney injury on chronic kidney disease stage IIIb-patient does not have CKD stage IV: Last creatinine in chart is from December 2022 which was 2.09 with GFR of 24.  However prior to that, creatinine was around 1.5-1.7 making it CKD stage IIIb.  Patient's creatinine upon arrival was at baseline but rose and peaked at 3.19 on 03/06/2023.  Creatinine improved to 1.9 but then rose to 2.3 but improved to 2.1 yesterday.   Hyperkalemia: Resolved   Anxiety/delusion/auditory elucidation/Alzheimer's dementia: Continue Lexapro and Namenda.   OAB: Continue oxybutynin.   Morbid obesity: Weight loss and diet modification counseled.   Hypernatremia Resolved.   Sacral decubitus ulcer present on admission stage II: RN Pressure Injury Documentation:        Pressure Injury 03/04/23 Other (Comment) Medial Stage 2 -  Partial thickness loss of dermis presenting as a shallow open injury with a red, pink wound bed without slough. (Active)  03/04/23 2000  Location: Other (Comment)  Location Orientation: Medial  Staging: Stage 2 -  Partial thickness loss of dermis presenting as a shallow open injury with a red, pink wound bed without slough.  Wound Description (Comments):   Present on Admission: Yes  Dressing Type Foam - Lift dressing to assess site every shift 03/06/23 1930    Acute blood loss anemia -Likely secondary to recent surgery -improved after 1 unit PRBC transfusion.  Remained stable for several days and down to 7.4 today.  No reports of melena, hematochezia or  hematemesis.  No other signs of bleeding.  Hemoglobin has remained stable between 7.3-7.5 for 4 days.   FEN/dysphagia: -Patient maintained on tube feeds, SLP following, started on dysphagia 1 diet 03/13/2023.  Slow weaning from tube feeds per dietitian and SLP.  Underwent MBS 03/14/2023.  Tube feed changed to nocturnal only on 03/16/2023 and eventually stopped on 03/17/2023.  Repeat MBS was completed 03/21/2023, diet advanced to dysphagia 2.  Per daughter, patient's appetite is improving.  After consulting with dietitian, cortrak was removed 03/22/2023.  Per dietitian, patient is meeting nutritional needs via p.o. intake.   GOC/poor prognosis: Due to patient's poor prognosis, on 03/18/2023 I had lengthy discussion with patient's son at the bedside.  We discussed the difference between DNR and full code.  I recommended switching her to DNR but he wanted her to be full code.  I encouraged him to have frank discussion among the siblings and consider DNR status.  I also touched the topic of possible PEG tube placement if they want to continue to be aggressive because patient appears to have recurrent aspiration, I have consulted palliative care to have detailed GOC conversation with the patient's family. Today 03/19/2023, I was able to meet her daughter Mamye Bolds who is the youngest of all the siblings but she is the Education officer, environmental.  I repeated similar discussion with her.  She wanted to take some time and think over this.  I made her aware that palliative care will be seeing patient and talking to her as well.   03/20/2023: Since patient has improved somewhat, daughter wants to keep her full code and wants to continue aggressive care at the moment.  She was made aware that palliative care may be visiting her soon as well.   Disposition: The medical team was under the impression that plan was to send her to SNF however yesterday we found out that daughter wants her to go home instead.  And today, daughter has  changed her mind and wants to go to SNF.  TOC informed, insurance authorization obtained and she is being discharged to SNF today.  Discharge plan was discussed with patient and/or family member and they verbalized understanding and agreed with it.  Discharge Diagnoses:  Principal Problem:   Closed fracture of right distal femur (HCC) Active Problems:   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)   Anemia of chronic disease   Chronic kidney disease, stage 4 (severe) (HCC)   Generalized anxiety disorder   Mutism   Sensorineural hearing loss (SNHL) of both ears   Severe obesity (HCC)    Discharge Instructions   Allergies as of 03/23/2023   No Known Allergies      Medication List     TAKE these medications    amLODipine 5 MG tablet Commonly known as: NORVASC Take 5 mg by mouth daily.   amoxicillin-clavulanate 500-125 MG tablet Commonly known as: AUGMENTIN Take 1 tablet by mouth 2 (two) times daily for 4 days.   apixaban 2.5 MG Tabs tablet Commonly known as: Eliquis Take 1 tablet (2.5 mg total) by mouth 2 (two) times daily for 14 days.   clonazePAM 0.5 MG tablet Commonly known as: KLONOPIN Take 1 tablet (0.5 mg total) by mouth daily.   dorzolamide-timolol 2-0.5 % ophthalmic solution Commonly known as: COSOPT Place 1 drop into both eyes 2 (two) times daily. What changed: how to take this   escitalopram 10 MG tablet Commonly known as: LEXAPRO Take 10 mg by mouth daily.   Melatonin Maximum Strength 5 MG Tabs Generic drug: melatonin Take 5 mg by mouth at bedtime.   memantine 10 MG tablet Commonly known as: NAMENDA Take 1 tablet (10 mg total) by mouth 2 (two) times daily.   methocarbamol 1000 MG/10ML injection Commonly known as: ROBAXIN Inject 5 mLs (500 mg total) into the vein every 8 (eight) hours as needed.   mirabegron ER 25 MG Tb24 tablet Commonly known as: MYRBETRIQ Take 25 mg by mouth every morning.   oxybutynin 5 MG tablet Commonly known as:  DITROPAN Take 1 tablet (5 mg total) by mouth 3 (three) times daily. What changed: See the new instructions.   QUEtiapine 25 MG tablet Commonly known as: SEROQUEL Take 0.5 tablets by mouth at bedtime.   vitamin D3 25 MCG tablet Commonly known as: CHOLECALCIFEROL Take 1 tablet (1,000 Units total) by mouth daily. Start taking on: March 24, 2023               Durable Medical Equipment  (From admission, onward)           Start     Ordered   03/12/23 1308  For home use only DME lightweight manual wheelchair with seat cushion  Once       Comments: Patient suffers from ground-level fall with a right supracondylar periprosthetic distal femur fracture-S/p ORIF which impairs their  ability to perform daily activities like toileting and grooming in the home. A rolling walker will not resolve issue with performing activities of daily living. A wheelchair will allow patient to safely perform daily activities. Patient is not able to propel themselves in the home using a standard weight wheelchair due to general weakness and post surgical procedure. Patient can self propel in the lightweight wheelchair. Length of need lifetime Accessories: elevating leg rests (ELRs), wheel locks, extensions and anti-tippers.   03/12/23 1309   03/12/23 1306  For home use only DME Hospital bed  Once       Question Answer Comment  Length of Need Lifetime   Patient has (list medical condition): ground-level fall with a right supracondylar periprosthetic distal femur fracture. S/p ORIF.   The above medical condition requires: Patient requires the ability to reposition frequently   Head must be elevated greater than: 30 degrees   Bed type Semi-electric   [employer] Yes   Trapeze Bar Yes   Support Surface: Gel Overlay      03/12/23 1306   03/12/23 1305  For home use only DME Bedside commode  Once       Comments: Confined to one area.  Question:  Patient needs a bedside commode to treat with the following  condition  Answer:  General weakness   03/12/23 1305            Contact information for follow-up providers     Haddix, Gillie Manners, MD. Schedule an appointment as soon as possible for a visit in 2 week(s).   Specialty: Orthopedic Surgery Why: for wound check, repeat x-rays, and suture removal Contact information: [address] [place] [address] [phone]         [facility] Follow up.   Why: Bbedside commode and wheelchair. Contact information: Located in: [employer] Address: [address], [place], [place] [address]  Phone: [phone]        [facility] Follow up.   Specialty: Home Health Services Why: Registered Nurse, Physical, Occupational Therapy and Aide-office to call with visit times. Contact information: [address] [place] [address] [phone]         Combs, Prince Solian, DO Follow up in 1 week(s).   Specialty: Geriatric Medicine Contact information: [address] [place] [address] [phone]              Contact information for after-discharge care     Destination     [facility] Preferred SNF .   Service: Skilled Nursing Contact information: [address] [place] [address] [phone]                    No Known Allergies  Consultations: Palliative care and orthopedics   Procedures/Studies: DG Swallowing Func-Speech Pathology Result Date: 03/21/2023 Table formatting from the original result was not included. Modified Barium Swallow Study Patient Details Name: OFFIE PICKRON MRN: [id number] Date of Birth: [birth year] Today's Date: 03/21/2023 HPI/PMH: HPI: BUENA BOEHM is an 83 y.o. 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  CT of the knee showed comminuted periprosthetic distal femur fracture, orthopedic surgery completed on 3/4. Clinical Impression: Ms. Ishii presents with stable dysphagia, similar in function to prior exam with silent  aspiraiton of all liquid textures. However, pt has improved in several indirect ways; she is now fully alert and attentive and trying to follow commands with ASL interpreter to the best of her ability. Her cognition is still a barrier to   consistent use of compensatory strategies, but with maximal verbal and visual cues pt was able to use a second swallow to clear oral residue in 100% of trials. Pt was less successful in following instructions to cough. She often signed the word cough or only phonated. It was difficult for her to comprehend what an explosive and effective cough should feel like, since she cant hear the model. Pts oral hygiene has also improved; she did not have any standing oral secretions prior to testing and secretions mixing with residue was not a problem during the exams today. Otherwise, pt still has delayed hyoid excursion with penetration prior to UES opening and aspiration of penetrate during and after the swallow iwthout sensation. Pt had a very late cough when a significant quantity of aspirate descended into the trachea. Cough still did not fully eject aspirate. Pt had the least severity of penetration or aspiration with teaspoons of honey thick liquids.  Advised her daughter that there is risk of aspiration with any and all oral intake and thickened liquids ultimately may not be a good long term solution. If pt is aspirating regardless, clear water with good oral care is advisable and would also increase pts QOL and decrease risk of dehydration due to refusal of adequate amount of thickened fluids. For now daughter can continue honey thick from a spoon, Minced and moist solids (IDDSI level 5) and encourage pt to swallow twice  and cough as much as possible. Pt reprotedly has been eating a good quantity of meals and is likely ready to advance to an oral only diet. Factors that may increase risk of adverse event in presence of aspiration Rubye Oaks [employer] Clearance Coots 2021): Factors that may increase risk of adverse event in presence of aspiration Rubye Oaks & Clearance Coots 2021): Frail or deconditioned; Reduced cognitive function; Poor general health and/or compromised immunity; Dependence for feeding and/or oral hygiene; Inadequate oral hygiene; Weak cough; Frequent aspiration of large volumes; Aspiration of thick, dense, and/or acidic materials Recommendations/Plan: Swallowing Evaluation Recommendations Swallowing Evaluation Recommendations Recommendations: PO diet PO Diet Recommendation: Dysphagia 2 (Finely chopped); Moderately thick liquids (Level 3, honey thick) Liquid Administration via: Spoon Medication Administration: Crushed with puree Supervision: Full assist for feeding; Full supervision/cueing for swallowing strategies Swallowing strategies  : Follow solids with liquids; Hard cough after swallowing; Multiple dry swallows after each bite/sip Postural changes: Position pt fully upright for meals Oral care recommendations: Oral care QID (4x/day) Treatment Plan Treatment Plan Treatment recommendations: Therapy as outlined in treatment plan below Follow-up recommendations: Home health SLP Functional status assessment: Patient has had a recent decline in their functional status and demonstrates the ability to make significant improvements in function in a reasonable and predictable amount of time. Treatment frequency: Min 2x/week Treatment duration: 2 weeks Interventions: Aspiration precaution training; Patient/family education; Compensatory techniques; Trials of upgraded texture/liquids; Diet toleration management by SLP Recommendations Recommendations for follow up therapy are one component of a multi-disciplinary discharge planning process, led by the  attending physician.  Recommendations may be updated based on patient status, additional functional criteria and insurance authorization. Assessment: Orofacial Exam: Orofacial Exam Oral Cavity: Oral Hygiene: Xerostomia Oral Cavity - Dentition: Poor condition Orofacial Anatomy: WFL Oral Motor/Sensory Function: WFL Anatomy: Anatomy: WFL Boluses Administered: Boluses Administered Boluses Administered:  Thin liquids (Level 0); Mildly thick liquids (Level 2, nectar thick); Moderately thick liquids (Level 3, honey thick); Puree; Solid  Oral Impairment Domain: Oral Impairment Domain Lip Closure: Escape beyond mid-chin Tongue control during bolus hold: Escape to lateral buccal cavity/floor of mouth Bolus preparation/mastication: Disorganized chewing/mashing with solid pieces of bolus unchewed Bolus transport/lingual motion: Slow tongue motion Oral residue: Residue collection on oral structures Location of oral residue : Tongue; Lateral sulci; Floor of mouth Initiation of pharyngeal swallow : Pyriform sinuses  Pharyngeal Impairment Domain: Pharyngeal Impairment Domain Soft palate elevation: No bolus between soft palate (SP)/pharyngeal wall (PW) Laryngeal elevation: Complete superior movement of thyroid cartilage with complete approximation of arytenoids to epiglottic petiole Anterior hyoid excursion: Complete anterior movement Epiglottic movement: Complete inversion Laryngeal vestibule closure: Incomplete, narrow column air/contrast in laryngeal vestibule Pharyngeal stripping wave : Present - complete Pharyngeal contraction (A/P view only): Complete Pharyngoesophageal segment opening: Partial distention/partial duration, partial obstruction of flow Pharyngeal residue: Collection of residue within or on pharyngeal structures Location of pharyngeal residue: Valleculae; Tongue base; Pharyngeal wall  Esophageal Impairment Domain: Esophageal Impairment Domain Esophageal clearance upright position: Complete clearance, esophageal  coating Pill: No data recorded Penetration/Aspiration Scale Score: Penetration/Aspiration Scale Score 1.  Material does not enter airway: Moderately thick liquids (Level 3, honey thick); Puree; Solid 8.  Material enters airway, passes BELOW cords without attempt by patient to eject out (silent aspiration) : Mildly thick liquids (Level 2, nectar thick); Thin liquids (Level 0); Moderately thick liquids (Level 3, honey thick) Compensatory Strategies: Compensatory Strategies Compensatory strategies: Yes Multiple swallows: Effective   General Information: Caregiver present: Yes  Diet Prior to this Study: Dysphagia 1 (pureed); Moderately thick liquids (Level 3, honey thick)   Temperature : Normal   Respiratory Status: WFL   Supplemental O2: None (Room air)   History of Recent Intubation: No  Behavior/Cognition: Alert; Cooperative Self-Feeding Abilities: Needs assist with self-feeding Baseline vocal quality/speech: Not observed Volitional Cough: Unable to elicit Volitional Swallow: Able to elicit No data recorded Goal Planning: Prognosis for improved oropharyngeal function: Fair Barriers to Reach Goals: Cognitive deficits; Severity of deficits No data recorded No data recorded Consulted and agree with results and recommendations: Patient; Family member/caregiver Pain: Pain Assessment Pain Assessment: Faces Faces Pain Scale: 4 Pain Location: R LE with movement Pain Descriptors / Indicators: Grimacing Pain Intervention(s): Monitored during session; Repositioned End of Session: Start Time:SLP Start Time (ACUTE ONLY): 1130 Stop Time: SLP Stop Time (ACUTE ONLY): 1213 Time Calculation:SLP Time Calculation (min) (ACUTE ONLY): 43 min Charges: SLP Evaluations $ SLP Speech Visit: 1 Visit SLP Evaluations $MBS Swallow: 1 Procedure $Swallowing Treatment: 1 Procedure SLP visit diagnosis: SLP Visit Diagnosis: Dysphagia, oropharyngeal phase (R13.12) Past Medical History: Past Medical History: Diagnosis Date  Auditory hallucinations  11/17/2020  History of kidney stones   Hypertension  Past Surgical History: Past Surgical History: Procedure Laterality Date  KNEE SURGERY    10-15 years ago  ORIF FEMUR FRACTURE Right 03/05/2023  Procedure: OPEN REDUCTION INTERNAL FIXATION (ORIF) DISTAL FEMUR FRACTURE;  Surgeon: Roby Lofts, MD;  Location: [facility] OR;  Service: Orthopedics;  Laterality: Right; DeBlois, Riley Nearing 03/21/2023, 12:52 PM  DG Knee AP/LAT W/Sunrise Left Result Date: 03/20/2023 CLINICAL DATA:  Left knee swelling. EXAM: LEFT KNEE 3 VIEWS COMPARISON:  Left knee radiographs 10/08/2013 FINDINGS: Redemonstration of total left knee arthroplasty. No perihardware lucency is seen to indicate hardware failure or loosening. Large body habitus, similar to prior. No definite joint effusion. There are numerous (at least 13) mineralized loose  bodies seen posterior to the medial knee compartment, measuring up to 13 mm. These are similar to prior and again are likely within a Baker's cyst. No acute fracture or dislocation. IMPRESSION: 1. 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 2. Numerous (at least 13) mineralized loose bodies seen posterior to the medial knee compartment, measuring up to 13 mm. These are similar to prior and again are likely within a Baker's cyst. Electronically Signed   By: Neita Garnet M.D.   On: 03/20/2023 11:22   DG FEMUR, MIN 2 VIEWS RIGHT Result Date: 03/19/2023 CLINICAL DATA:  Closed femur fracture.  Follow-up. EXAM: RIGHT FEMUR 2 VIEWS COMPARISON:  Radiographs 03/05/2023 and 03/04/2023.  CT 03/04/2023. FINDINGS: The hardware is intact without loosening status post lateral plate and screw fixation of the comminuted distal femur fracture. Posterior displacement of the fracture on the lateral view may be slightly increased, now measuring 1.5 cm. No interval fracture healing identified. Stable postsurgical changes from previous right total knee arthroplasty. Stable moderate degenerative changes at  the right hip. IMPRESSION: Possible slight increase in posterior displacement of the comminuted distal femur fracture status post ORIF. No evidence of hardware loosening or interval fracture healing. Electronically Signed   By: Carey Bullocks M.D.   On: 03/19/2023 14:35   DG Chest Port 1 View Result Date: 03/17/2023 CLINICAL DATA:  Acute respiratory distress EXAM: PORTABLE CHEST 1 VIEW COMPARISON:  03/14/2023 FINDINGS: Cardiomegaly. Aortic atherosclerosis. Mild vascular congestion. Right basilar airspace opacity. No confluent opacity on the left. No overt edema or effusions. No acute bony abnormality. IMPRESSION: Cardiomegaly, vascular congestion. Right base atelectasis or infiltrate. Electronically Signed   By: Charlett Nose M.D.   On: 03/17/2023 19:14   ECHOCARDIOGRAM COMPLETE Result Date: 03/15/2023    ECHOCARDIOGRAM REPORT   Patient Name:   GENA LASKI Date of Exam: 03/15/2023 Medical Rec #:  [id number]          Height:       69.0 in Accession #:    [id number]         Weight:       246.9 lb Date of Birth:  October 13, 1940          BSA:          2.260 m Patient Age:    82 years           BP:           128/54 mmHg Patient Gender: F                  HR:           68 bpm. Exam Location:  Inpatient Procedure: 2D Echo, Cardiac Doppler and Color Doppler (Both Spectral and Color            Flow Doppler were utilized during procedure). Indications:    CHF  History:        Patient has no prior history of Echocardiogram examinations.  Sonographer:    Amy Chionchio Referring Phys: [id number] Doyt Castellana IMPRESSIONS  1. Left ventricular ejection fraction, by estimation, is 60 to 65%. The left ventricle has normal function. The left ventricle has no regional wall motion abnormalities. There is mild left ventricular hypertrophy of the basal-septal segment. Left ventricular diastolic parameters are consistent with Grade II diastolic dysfunction (pseudonormalization). Elevated left ventricular end-diastolic pressure.  2.  Right ventricular systolic function is normal. The right ventricular size is mildly enlarged.  3. Left atrial size was severely dilated.  4. The mitral valve is normal in structure. No evidence of mitral valve regurgitation. No evidence of mitral stenosis.  5. The aortic valve is tricuspid. Aortic valve regurgitation is not visualized. Aortic valve sclerosis/calcification is present, without any evidence of aortic stenosis. Aortic valve area, by VTI measures 2.17 cm. Aortic valve mean gradient measures 7.0 mmHg. Aortic valve Vmax measures 1.60 m/s.  6. The inferior vena cava is normal in size with greater than 50% respiratory variability, suggesting right atrial pressure of 3 mmHg. FINDINGS  Left Ventricle: Left ventricular ejection fraction, by estimation, is 60 to 65%. The left ventricle has normal function. The left ventricle has no regional wall motion abnormalities. The left ventricular internal cavity size was normal in size. There is  mild left ventricular hypertrophy of the basal-septal segment. Left ventricular diastolic parameters are consistent with Grade II diastolic dysfunction (pseudonormalization). Elevated left ventricular end-diastolic pressure. Right Ventricle: The right ventricular size is mildly enlarged. No increase in right ventricular wall thickness. Right ventricular systolic function is normal. Left Atrium: Left atrial size was severely dilated. Right Atrium: Right atrial size was normal in size. Pericardium: There is no evidence of pericardial effusion. Mitral Valve: The mitral valve is normal in structure. No evidence of mitral valve regurgitation. No evidence of mitral valve stenosis. MV peak gradient, 5.8 mmHg. The mean mitral valve gradient is 2.0 mmHg. Tricuspid Valve: The tricuspid valve is normal in structure. Tricuspid valve regurgitation is trivial. No evidence of tricuspid stenosis. Aortic Valve: The aortic valve is tricuspid. Aortic valve regurgitation is not visualized. Aortic  valve sclerosis/calcification is present, without any evidence of aortic stenosis. Aortic valve mean gradient measures 7.0 mmHg. Aortic valve peak gradient measures 10.2 mmHg. Aortic valve area, by VTI measures 2.17 cm. Pulmonic Valve: The pulmonic valve was normal in structure. Pulmonic valve regurgitation is not visualized. No evidence of pulmonic stenosis. Aorta: The aortic root is normal in size and structure. Venous: The inferior vena cava is normal in size with greater than 50% respiratory variability, suggesting right atrial pressure of 3 mmHg. IAS/Shunts: No atrial level shunt detected by color flow Doppler.  LEFT VENTRICLE PLAX 2D LVIDd:         4.70 cm      Diastology LVIDs:         3.20 cm      LV e' medial:    6.42 cm/s LV PW:         1.00 cm      LV E/e' medial:  15.7 LV IVS:        1.10 cm      LV e' lateral:   7.94 cm/s LVOT diam:     2.10 cm      LV E/e' lateral: 12.7 LV SV:         77 LV SV Index:   34 LVOT Area:     3.46 cm  LV Volumes (MOD) LV vol d, MOD A2C: 127.0 ml LV vol d, MOD A4C: 110.0 ml LV vol s, MOD A2C: 41.6 ml LV vol s, MOD A4C: 41.4 ml LV SV MOD A2C:     85.4 ml LV SV MOD A4C:     110.0 ml LV SV MOD BP:      78.4 ml RIGHT VENTRICLE RV Basal diam:  4.00 cm RV Mid diam:    3.70 cm TAPSE (M-mode): 1.7 cm LEFT ATRIUM            Index  RIGHT ATRIUM           Index LA Vol (A4C): 114.0 ml 50.45 ml/m  RA Area:     24.70 cm                                     RA Volume:   71.80 ml  31.78 ml/m  AORTIC VALVE                     PULMONIC VALVE AV Area (Vmax):    2.47 cm      PV Vmax:       0.94 m/s AV Area (Vmean):   2.10 cm      PV Peak grad:  3.6 mmHg AV Area (VTI):     2.17 cm AV Vmax:           160.00 cm/s AV Vmean:          122.000 cm/s AV VTI:            0.353 m AV Peak Grad:      10.2 mmHg AV Mean Grad:      7.0 mmHg LVOT Vmax:         114.00 cm/s LVOT Vmean:        73.800 cm/s LVOT VTI:          0.221 m LVOT/AV VTI ratio: 0.63  AORTA Ao Root diam: 3.40 cm MITRAL VALVE                 TRICUSPID VALVE MV Area (PHT): 3.20 cm     TR Peak grad:   42.5 mmHg MV Area VTI:   2.31 cm     TR Vmax:        326.00 cm/s MV Peak grad:  5.8 mmHg MV Mean grad:  2.0 mmHg     SHUNTS MV Vmax:       1.20 m/s     Systemic VTI:  0.22 m MV Vmean:      71.6 cm/s    Systemic Diam: 2.10 cm MV Decel Time: 237 msec MV E velocity: 101.00 cm/s MV A velocity: 97.10 cm/s MV E/A ratio:  1.04 Armanda Magic MD Electronically signed by Armanda Magic MD Signature Date/Time: 03/15/2023/1:55:07 PM    Final    DG Swallowing Func-Speech Pathology Result Date: 03/15/2023 Table formatting from the original result was not included. Modified Barium Swallow Study Patient Details Name: OSSIE YEBRA MRN: [id number] Date of Birth: Jul 16, 1940 Today's Date: 03/15/2023 HPI/PMH: HPI: KITTIE KRIZAN is an 83 y.o. 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  CT of the knee showed comminuted periprosthetic distal femur fracture, orthopedic surgery completed on 3/4. Clinical Impression: Pt alert, but participation limited due to weakness and pt poorly attentive to ASL interpreter.  Pt unable to follow most commands to cough or swallow.  Pt found to have thick pooled oral secretions, tinted brown from history of tobacco chewing. Pt left leaning throught test, difficult to reposition. Dysphagia characterized as severe (DIGEST score 3). There was prolonged oral holding, tremulous lingual tension and moderate oral residue. Oral transit somewhat hesitant with timely laryngeal closure, but delayed and effortful swallow intiation. Pt consistently penetrated nectar and thin liquids before  the swallow and aspirated during and after due to residue and oropharyngeal secretions.  Sensation intermittent, cough insufficient. Pt  additionally had moderate residue at the PES though this was hard to visualize with pts position. Pt is at risk of aspiration with all textures. There was most significant aspiration and coughing with thin liquids. Decreasing bolus size to teaspoon with honey improved timing and control. Pt also tolerated puree well, but suspect oropharyngeal residue and secretions will be aspirated regardless of modified diet. Daughter and pt may want to consider allowing unrestricted diet with known risk given that thickening may not significantly improve outcomes in this case given pt difficulty in following abstract swallowing commands even with in person interpreter. Brief discussion with daughter after exam; she verbalized desire to continue Dys 1/honey thick liquids in order to reduce risk. Not ready to consider comfort feeding at this time. SLP encouraged oral care to decrease bacterial load as the most important objective. Factors that may increase risk of adverse event in presence of aspiration Rubye Oaks [employer] Coots 2021): No data recorded Recommendations/Plan: Swallowing Evaluation Recommendations No data recorded Treatment Plan No data recorded Recommendations Recommendations for follow up therapy are one component of a multi-disciplinary discharge planning process, led by the attending physician.  Recommendations may be updated based on patient status, additional functional criteria and insurance authorization. Assessment: Orofacial Exam: Orofacial Exam Oral Cavity - Dentition: Poor condition Anatomy: No data recorded Boluses Administered: Boluses Administered Boluses Administered: Thin liquids (Level 0); Mildly thick liquids (Level 2, nectar thick); Moderately thick liquids (Level 3, honey thick); Puree  Oral Impairment Domain: Oral Impairment Domain Lip Closure: Escape beyond mid-chin Tongue control during bolus hold: Not tested Bolus transport/lingual motion: Delayed initiation of tongue motion (oral holding) Oral  residue: Residue collection on oral structures Location of oral residue : Tongue; Lateral sulci; Floor of mouth Initiation of pharyngeal swallow : Pyriform sinuses  Pharyngeal Impairment Domain: Pharyngeal Impairment Domain Pharyngeal residue: Collection of residue within or on pharyngeal structures Location of pharyngeal residue: Pyriform sinuses; Tongue base  Esophageal Impairment Domain: No data recorded Pill: No data recorded Penetration/Aspiration Scale Score: No data recorded Compensatory Strategies: No data recorded  General Information: No data recorded Diet Prior to this Study: Dysphagia 1 (pureed); Moderately thick liquids (Level 3, honey thick)   Temperature : Normal   Respiratory Status: WFL   Supplemental O2: Nasal cannula   History of Recent Intubation: No  Behavior/Cognition: Alert; Cooperative Self-Feeding Abilities: Needs assist with self-feeding Baseline vocal quality/speech: Not observed Volitional Cough: -- (weak congested) Volitional Swallow: Able to elicit No data recorded Goal Planning: No data recorded No data recorded No data recorded No data recorded No data recorded Pain: Pain Assessment Pain Assessment: Faces Pain Location: B LE with rolling for pericare Pain Descriptors / Indicators: Grimacing; Guarding; Moaning Pain Intervention(s): Limited activity within patient's tolerance; Monitored during session; Repositioned End of Session: Start Time:SLP Start Time (ACUTE ONLY): 1200 Stop Time: SLP Stop Time (ACUTE ONLY): 1235 Time Calculation:SLP Time Calculation (min) (ACUTE ONLY): 35 min Charges: SLP Evaluations $ SLP Speech Visit: 1 Visit SLP Evaluations $MBS Swallow: 1 Procedure $Swallowing Treatment: 1 Procedure SLP visit diagnosis: SLP Visit Diagnosis: Dysphagia, oropharyngeal phase (R13.12) Past Medical History: Past Medical History: Diagnosis Date  Auditory hallucinations 11/17/2020  History of kidney stones   Hypertension  Past Surgical History: Past Surgical History: Procedure  Laterality Date  KNEE SURGERY    10-15 years ago  ORIF FEMUR FRACTURE Right 03/05/2023  Procedure: OPEN REDUCTION INTERNAL FIXATION (  ORIF) DISTAL FEMUR FRACTURE;  Surgeon: Roby Lofts, MD;  Location: [facility] OR;  Service: Orthopedics;  Laterality: Right; [place], Riley Nearing 03/15/2023, 10:10 AM  DG CHEST PORT 1 VIEW Result Date: 03/14/2023 CLINICAL DATA:  Shortness of breath EXAM: PORTABLE CHEST 1 VIEW COMPARISON:  03/09/2023, 03/08/2023 FINDINGS: Enteric tube tip below the diaphragm but incompletely assessed. Cardiomegaly with slightly improved aeration of left base. Residual vascular congestion. Aortic atherosclerosis. IMPRESSION: Cardiomegaly with mild vascular congestion. Slightly improved aeration of left base. Electronically Signed   By: Jasmine Pang M.D.   On: 03/14/2023 23:54   DG CHEST PORT 1 VIEW Result Date: 03/09/2023 CLINICAL DATA:  Aspiration into airway. EXAM: PORTABLE CHEST 1 VIEW COMPARISON:  Radiograph yesterday FINDINGS: The heart is enlarged, unchanged. Stable mediastinal contours. Aortic atherosclerosis. Worsening retrocardiac opacity which now appears confluent. Similar patchy right lung base opacity. No pneumothorax. No definite pleural effusion. IMPRESSION: 1. Worsening retrocardiac opacity which now appears confluent. Similar patchy right lung base opacity. 2. Unchanged cardiomegaly. Electronically Signed   By: Narda Rutherford M.D.   On: 03/09/2023 16:21   DG Abd Portable 1V Result Date: 03/09/2023 CLINICAL DATA:  Encounter for feeding tube placement. EXAM: PORTABLE ABDOMEN - 1 VIEW COMPARISON:  None Available. FINDINGS: Tip of the weighted enteric tube below the diaphragm in the midline in the region of the distal stomach. No definite small bowel dilatation in the included upper abdomen. IMPRESSION: Tip of the weighted enteric tube below the diaphragm in the region of the distal stomach. Electronically Signed   By: Narda Rutherford M.D.   On: 03/09/2023 16:20   DG CHEST PORT 1  VIEW Result Date: 03/08/2023 CLINICAL DATA:  Concern for aspiration EXAM: PORTABLE CHEST 1 VIEW COMPARISON:  03/04/2023 chest x-ray, CT chest 10/08/2013 FINDINGS: Cardiomegaly. Enlarged mediastinal silhouette with ectatic aortic knob but stable. Interval patchy left greater than right airspace disease. No pleural effusion or pneumothorax. Aortic atherosclerosis. IMPRESSION: 1. Stable enlarged cardiomediastinal silhouette 2. Interval worsening of left greater than right patchy basilar airspace disease which may be due to atelectasis, pneumonia or aspiration Electronically Signed   By: Jasmine Pang M.D.   On: 03/08/2023 14:59   CT HEAD WO CONTRAST ( ) Result Date: 03/07/2023 CLINICAL DATA:  Encephalopathy (Ped 0-17y) EXAM: CT HEAD WITHOUT CONTRAST TECHNIQUE: Contiguous axial images were obtained from the base of the skull through the vertex without intravenous contrast. RADIATION DOSE REDUCTION: This exam was performed according to the departmental dose-optimization program which includes automated exposure control, adjustment of the mA and/or kV according to patient size and/or use of iterative reconstruction technique. COMPARISON:  CT head March 04, 2023. FINDINGS: Brain: No evidence of acute infarction, hemorrhage, hydrocephalus, extra-axial collection or mass lesion/mass effect. Vascular: No hyperdense vessel. Skull: No acute fracture. Sinuses/Orbits: Moderate paranasal sinus mucosal thickening. No acute orbital findings. Other: No mastoid effusions. IMPRESSION: No evidence of acute intracranial abnormality. Electronically Signed   By: Feliberto Harts M.D.   On: 03/07/2023 19:23   DG FEMUR PORT, MIN 2 VIEWS RIGHT Result Date: 03/05/2023 CLINICAL DATA:  Fracture, postop. EXAM: RIGHT FEMUR PORTABLE 2 VIEW COMPARISON:  Preoperative imaging FINDINGS: Moderate plate and screw fixation of displaced distal femur fracture. Improved fracture alignment from preoperative imaging. Mild residual displacement. There is  been prior knee arthroplasty. Recent postsurgical change includes air and edema in the soft tissues. IMPRESSION: ORIF distal femur fracture without immediate postoperative complication. Electronically Signed   By: Narda Rutherford M.D.   On: 03/05/2023 15:56   DG  FEMUR, MIN 2 VIEWS RIGHT Result Date: 03/05/2023 CLINICAL DATA:  Elective surgery. EXAM: RIGHT FEMUR 2 VIEWS COMPARISON:  Preoperative imaging FINDINGS: Eight fluoroscopic spot views of the right femur submitted from the operating room. Plate and screw fixation of displaced distal femur fracture, improved fracture alignment from preoperative imaging. Previous knee replacement. Fluoroscopy time 1 minutes 47 seconds. Dose 14.37 mGy. IMPRESSION: Procedural fluoroscopy during right femur fracture fixation. Electronically Signed   By: Narda Rutherford M.D.   On: 03/05/2023 15:49   DG C-Arm 1-60 Min-No Report Result Date: 03/05/2023 Fluoroscopy was utilized by the requesting physician.  No radiographic interpretation.   DG C-Arm 1-60 Min-No Report Result Date: 03/05/2023 Fluoroscopy was utilized by the requesting physician.  No radiographic interpretation.   CT KNEE RIGHT WO CONTRAST Result Date: 03/04/2023 CLINICAL DATA:  Larey Seat, right knee fracture EXAM: CT OF THE RIGHT KNEE WITHOUT CONTRAST TECHNIQUE: Multidetector CT imaging of the right knee was performed according to the standard protocol. Multiplanar CT image reconstructions were also generated. RADIATION DOSE REDUCTION: This exam was performed according to the departmental dose-optimization program which includes automated exposure control, adjustment of the mA and/or kV according to patient size and/or use of iterative reconstruction technique. COMPARISON:  03/04/2023 FINDINGS: Bones/Joint/Cartilage There is an oblique comminuted periprosthetic distal right femur fracture. The distal fracture fragment including the femoral component of the right knee prosthesis is displaced 4 cm distally and 6.5 cm  laterally. In addition, there is 4 cm of proximal migration of the distal fracture fragment relative to the femoral diaphysis. There is slight lateral tilt of the patellar component, without frank dislocation. Proximal tibia and fibula are unremarkable. Ligaments Suboptimally assessed by CT. Muscles and Tendons Diffuse muscular atrophy. Soft tissues Soft tissue swelling surrounding the distal femoral fracture site within the deep compartment of the distal right thigh. The superficial soft tissues are unremarkable. There is no fluid collection or hematoma. Reconstructed images demonstrate no additional findings. IMPRESSION: 1. Comminuted periprosthetic distal right femur fracture, with dorsal, lateral, and proximal migration of the distal fracture fragment as described above. 2. Marked soft tissue swelling surrounding the femoral fracture site. No fluid collection or hematoma. Electronically Signed   By: Sharlet Salina M.D.   On: 03/04/2023 19:40   CT Head Wo Contrast Result Date: 03/04/2023 CLINICAL DATA:  Multiple falls. EXAM: CT HEAD WITHOUT CONTRAST TECHNIQUE: Contiguous axial images were obtained from the base of the skull through the vertex without intravenous contrast. RADIATION DOSE REDUCTION: This exam was performed according to the departmental dose-optimization program which includes automated exposure control, adjustment of the mA and/or kV according to patient size and/or use of iterative reconstruction technique. COMPARISON:  December 15, 2020 FINDINGS: Brain: There is mild cerebral atrophy with widening of the extra-axial spaces and ventricular dilatation. There are areas of decreased attenuation within the white matter tracts of the supratentorial brain, consistent with microvascular disease changes. Vascular: No hyperdense vessel or unexpected calcification. Skull: Normal. Negative for fracture or focal lesion. Sinuses/Orbits: There is mild bilateral maxillary sinus and mild bilateral ethmoid sinus  mucosal thickening. Other: None. IMPRESSION: 1. No acute intracranial abnormality. 2. Generalized cerebral atrophy with widening of the extra-axial spaces and ventricular dilatation. 3. Mild bilateral maxillary sinus and mild bilateral ethmoid sinus disease. Electronically Signed   By: Aram Candela M.D.   On: 03/04/2023 17:18   CT Cervical Spine Wo Contrast Result Date: 03/04/2023 CLINICAL DATA:  Neck trauma. Fall at home last night and this morning. EXAM: CT CERVICAL SPINE WITHOUT  CONTRAST TECHNIQUE: Multidetector CT imaging of the cervical spine was performed without intravenous contrast. Multiplanar CT image reconstructions were also generated. RADIATION DOSE REDUCTION: This exam was performed according to the departmental dose-optimization program which includes automated exposure control, adjustment of the mA and/or kV according to patient size and/or use of iterative reconstruction technique. COMPARISON:  CT cervical spine 07/24/2020. FINDINGS: Alignment: Stable normal alignment. Skull base and vertebrae: No evidence of acute fracture or traumatic subluxation. Multilevel spondylosis with probable interbody ankylosis at C6-7. Soft tissues and spinal canal: No prevertebral fluid or swelling. No visible canal hematoma. Disc levels: Multilevel spondylosis with disc space narrowing, endplate osteophytes and facet hypertrophy. There is the potential for spinal stenosis at several levels, greatest at C6-7, grossly stable. Mild multilevel osseous foraminal narrowing. Upper chest: Clear lung apices. Bilateral carotid atherosclerosis. Grossly stable 1.3 cm left thyroid nodule on image 76/6 for which no specific follow-up imaging is recommended. Other: None. IMPRESSION: 1. No evidence of acute cervical spine fracture, traumatic subluxation or static signs of instability. 2. Multilevel cervical spondylosis, grossly stable. Electronically Signed   By: Carey Bullocks M.D.   On: 03/04/2023 17:17   DG FEMUR, MIN 2  VIEWS RIGHT Result Date: 03/04/2023 CLINICAL DATA:  Fall with knee pain. EXAM: RIGHT FEMUR 2 VIEWS COMPARISON:  Knee radiographs dated 07/24/2020. FINDINGS: The patient is status post a knee arthroplasty. There is a displaced (1 shaft with) and overriding (approximately 8 cm) fracture of the distal femur. IMPRESSION: Displaced and overriding fracture of the distal femur. Electronically Signed   By: Romona Curls M.D.   On: 03/04/2023 16:17   DG Chest 2 View Result Date: 03/04/2023 CLINICAL DATA:  Cough, coarse breath sounds. EXAM: CHEST - 2 VIEW COMPARISON:  Chest radiograph dated 10/08/2013 FINDINGS: The heart is enlarged. Minimal bibasilar atelectasis/airspace disease. No significant pleural effusion or pneumothorax. Degenerative changes are seen in the spine. IMPRESSION: Minimal bibasilar atelectasis/airspace disease. Electronically Signed   By: Romona Curls M.D.   On: 03/04/2023 16:13   DG Pelvis 1-2 Views Result Date: 03/04/2023 CLINICAL DATA:  Fall with pelvic pain. EXAM: PELVIS - 1-2 VIEW COMPARISON:  Pelvic radiographs dated 10/07/2013. FINDINGS: There is no evidence of pelvic fracture or diastasis. 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 Severe degenerative changes are seen in the lumbar spine. Stool overlies the rectum. IMPRESSION: 1. No acute osseous injury. Electronically Signed   By: Romona Curls M.D.   On: 03/04/2023 16:11   DG Knee 1-2 Views Right Result Date: 03/04/2023 CLINICAL DATA:  Fall with knee pain. EXAM: RIGHT KNEE - 1-2 VIEW COMPARISON:  Knee radiographs dated 07/24/2020 FINDINGS: The patient is status post a knee arthroplasty. There is a displaced (1 shaft width) and overriding (approximately 8 cm) fracture of the distal femur. No knee joint dislocation, however there likely is a knee joint effusion. IMPRESSION: Displaced and overriding fracture of the distal femur. Electronically Signed   By: Romona Curls M.D.   On: 03/04/2023 16:10     Discharge Exam: Vitals:    03/23/23 0405 03/23/23 0756  BP: 133/81 (!) 141/56  Pulse: (!) 57 (!) 59  Resp: 18 20  Temp: 98.3 F (36.8 C) 98 F (36.7 C)  SpO2: 97% 100%   Vitals:   03/22/23 2341 03/23/23 0405 03/23/23 0454 03/23/23 0756  BP: (!) 115/57 133/81  (!) 141/56  Pulse: 62 (!) 57  (!) 59  Resp: 20 18  20   Temp: 98.1 F (36.7 C) 98.3 F (36.8 C)  98  F (36.7 C)  TempSrc: Oral Oral  Oral  SpO2: 96% 97%  100%  Weight:   115 kg   Height:        General: Pt is alert, awake, not in acute distress Cardiovascular: RRR, S1/S2 +, no rubs, no gallops Respiratory: CTA bilaterally, no wheezing, no rhonchi Abdominal: Soft, NT, ND, bowel sounds + Extremities: no edema, no cyanosis    The results of significant diagnostics from this hospitalization (including imaging, microbiology, ancillary and laboratory) are listed below for reference.     Microbiology: No results found for this or any previous visit (from the past 240 hours).   Labs: BNP (last 3 results) Recent Labs    03/04/23 1607 03/15/23 0040  BNP 325.0* 169.7*   Basic Metabolic Panel: Recent Labs  Lab 03/17/23 1832 03/18/23 0218 03/18/23 1602 03/19/23 0820 03/20/23 0547 03/22/23 0422  NA 137 141  --  140 144 144  K 4.9 5.5* 4.9 4.5 4.0 3.9  CL 102 104  --  105 105 112*  CO2 23 22  --  25 24 26   GLUCOSE 120* 131*  --  106* 93 93  BUN 100* 102*  --  104* 101* 78*  CREATININE 1.98* 2.39*  --  2.33* 2.23* 2.08*  CALCIUM 8.3* 8.2*  --  7.7* 8.0* 8.1*  MG 2.0  --   --  2.2  --   --    Liver Function Tests: No results for input(s): "AST", "ALT", "ALKPHOS", "BILITOT", "PROT", "ALBUMIN" in the last 168 hours. No results for input(s): "LIPASE", "AMYLASE" in the last 168 hours. No results for input(s): "AMMONIA" in the last 168 hours. CBC: Recent Labs  Lab 03/18/23 0218 03/19/23 0820 03/19/23 1646 03/20/23 0547 03/22/23 0422  WBC 22.8* 9.8 8.8 7.6 7.0  NEUTROABS 19.8* 8.0* 7.2 5.7 5.1  HGB 8.4* 7.5* 7.7* 7.4* 7.3*  HCT  26.5* 24.1* 25.1* 24.1* 24.7*  MCV 91.7 92.0 92.3 93.1 96.1  PLT 332 308 338 319 319   Cardiac Enzymes: No results for input(s): "CKTOTAL", "CKMB", "CKMBINDEX", "TROPONINI" in the last 168 hours. BNP: Invalid input(s): "POCBNP" CBG: Recent Labs  Lab 03/22/23 1649 03/22/23 1933 03/22/23 2345 03/23/23 0409 03/23/23 0755  GLUCAP 89 95 85 93 82   D-Dimer No results for input(s): "DDIMER" in the last 72 hours. Hgb A1c No results for input(s): "HGBA1C" in the last 72 hours. Lipid Profile No results for input(s): "CHOL", "HDL", "LDLCALC", "TRIG", "CHOLHDL", "LDLDIRECT" in the last 72 hours. Thyroid function studies No results for input(s): "TSH", "T4TOTAL", "T3FREE", "THYROIDAB" in the last 72 hours.  Invalid input(s): "FREET3" Anemia work up No results for input(s): "VITAMINB12", "FOLATE", "FERRITIN", "TIBC", "IRON", "RETICCTPCT" in the last 72 hours. Urinalysis    Component Value Date/Time   COLORURINE AMBER (A) 03/08/2023 1031   APPEARANCEUR CLOUDY (A) 03/08/2023 1031   LABSPEC 1.016 03/08/2023 1031   PHURINE 5.0 03/08/2023 1031   GLUCOSEU NEGATIVE 03/08/2023 1031   HGBUR LARGE (A) 03/08/2023 1031   BILIRUBINUR NEGATIVE 03/08/2023 1031   KETONESUR 20 (A) 03/08/2023 1031   PROTEINUR 100 (A) 03/08/2023 1031   UROBILINOGEN 0.2 05/20/2010 0549   NITRITE NEGATIVE 03/08/2023 1031   LEUKOCYTESUR SMALL (A) 03/08/2023 1031   Sepsis Labs Recent Labs  Lab 03/19/23 0820 03/19/23 1646 03/20/23 0547 03/22/23 0422  WBC 9.8 8.8 7.6 7.0   Microbiology No results found for this or any previous visit (from the past 240 hours).  FURTHER DISCHARGE INSTRUCTIONS:   Get Medicines reviewed and adjusted:  Please take all your medications with you for your next visit with your Primary MD   Laboratory/radiological data: Please request your Primary MD to go over all hospital tests and procedure/radiological results at the follow up, please ask your Primary MD to get all Hospital records  sent to his/her office.   In some cases, they will be blood work, cultures and biopsy results pending at the time of your discharge. Please request that your primary care M.D. goes through all the records of your hospital data and follows up on these results.   Also Note the following: If you experience worsening of your admission symptoms, develop shortness of breath, life threatening emergency, suicidal or homicidal thoughts you must seek medical attention immediately by calling 911 or calling your MD immediately  if symptoms less severe.   You must read complete instructions/literature along with all the possible adverse reactions/side effects for all the Medicines you take and that have been prescribed to you. Take any new Medicines after you have completely understood and accpet all the possible adverse reactions/side effects.    Do not drive when taking Pain medications or sleeping medications (Benzodaizepines)   Do not take more than prescribed Pain, Sleep and Anxiety Medications. It is not advisable to combine anxiety,sleep and pain medications without talking with your primary care practitioner   Special Instructions: If you have smoked or chewed Tobacco  in the last 2 yrs please stop smoking, stop any regular Alcohol  and or any Recreational drug use.   Wear Seat belts while driving.   Please note: You were cared for by a hospitalist during your hospital stay. Once you are discharged, your primary care physician will handle any further medical issues. Please note that NO REFILLS for any discharge medications will be authorized once you are discharged, as it is imperative that you return to your primary care physician (or establish a relationship with a primary care physician if you do not have one) for your post hospital discharge needs so that they can reassess your need for medications and monitor your lab values  Time coordinating discharge: Over 30 minutes  SIGNED:   Hughie Closs, MD  [facility] 03/23/2023, 11:06 AM *Please note that this is a verbal dictation therefore any spelling or grammatical errors are due to the "Dragon Medical One" system interpretation. If 7PM-7AM, please contact night-coverage [email or web address]

## 2023-03-23 NOTE — Progress Notes (Signed)
 Sharon Ramos 2W41[facility] Liaison Note: Notified by [facility] manager of patient/family request for [facility] Palliative services at home after discharge. Please call with any hospice or outpatient palliative care related questions. Thank you for the opportunity to participate in this patient's care. Henderson Newcomer, LPN [facility] Liaison [phone]

## 2023-03-23 NOTE — Progress Notes (Signed)
 Occupational Therapy Treatment Patient Details Name: Sharon Ramos MRN: [id number] DOB: 1940/04/11 Today's Date: 03/23/2023   History of present illness Patient is a 83 year old female presenting 3/2 with  ground-level fall with a right supracondylar periprosthetic distal femur fracture. S/p ORIF 3/3.  History of hypertension, CKD 4, anxiety, hallucination, Alzheimer's dementia, anemia, deaf, mute, OAB, and obesity.   OT comments  Pt tolerated seated EOB activity for at least 10 minutes. Pt able to feed self seated EOB with only min assist to maintain upright position. Pt able to shift wt in all directions seated EOB with mod loss of balance with feet and single upper extremity supported. Progressing well towards goals but continues to be limited by decreased balance, activity tolerance, and strength. OT will continue to follow acutely.      If plan is discharge home, recommend the following:  Two people to help with walking and/or transfers;Two people to help with bathing/dressing/bathroom;Assistance with cooking/housework;Assistance with feeding;Direct supervision/assist for medications management;Direct supervision/assist for financial management;Assist for transportation;Supervision due to cognitive status   Equipment Recommendations  BSC/3in1    Recommendations for Other Services      Precautions / Restrictions Precautions Precautions: Fall Recall of Precautions/Restrictions: Impaired Precaution/Restrictions Comments: Rectal Tube Restrictions Weight Bearing Restrictions Per Provider Order: Yes RLE Weight Bearing Per Provider Order: Weight bearing as tolerated       Mobility Bed Mobility Overal bed mobility: Needs Assistance Bed Mobility: Supine to Sit, Sit to Supine     Supine to sit: HOB elevated, Used rails, Total assist, +2 for physical assistance Sit to supine: Total assist, +2 for physical assistance   General bed mobility comments: Pt tries to assist during  supine to sit by walking LEs off bed but does <25%    Transfers Overall transfer level: Needs assistance                       Balance Overall balance assessment: Needs assistance Sitting-balance support: Feet supported, No upper extremity supported Sitting balance-Leahy Scale: Fair Sitting balance - Comments: Pt unable to maintain balance while shitfting wt, requiring min-mod A to maintain upright without UE support             ADL either performed or assessed with clinical judgement   ADL Overall ADL's : Needs assistance/impaired Eating/Feeding: Set up;Sitting;Bed level Eating/Feeding Details (indicate cue type and reason): Min assist to maintain upright, but able to self feed with set up and increased time d/t tremors          Extremity/Trunk Assessment Upper Extremity Assessment Upper Extremity Assessment: Generalized weakness;Right hand dominant;RUE deficits/detail;LUE deficits/detail RUE Coordination: decreased gross motor;decreased fine motor LUE Coordination: decreased fine motor;decreased gross motor   Lower Extremity Assessment Lower Extremity Assessment: Defer to PT evaluation        Vision   Vision Assessment?: No apparent visual deficits   Perception     Praxis     Communication Communication Communication: Impaired Factors Affecting Communication: Hearing impaired;Difficulty expressing self   Cognition Arousal: Alert Behavior During Therapy: Flat affect Cognition: History of cognitive impairments             OT - Cognition Comments: Daughter states mild dementia                 Following commands: Impaired Following commands impaired: Follows one step commands inconsistently, Follows one step commands with increased time      Cueing   Cueing Techniques: Verbal cues, Gestural cues,  Tactile cues, Visual cues             Pertinent Vitals/ Pain       Pain Assessment Pain Assessment: Faces Faces Pain Scale: Hurts little  more Pain Location: R LE with movement Pain Descriptors / Indicators: Grimacing, Guarding, Moaning, Discomfort, Operative site guarding Pain Intervention(s): Monitored during session, Repositioned   Frequency  Min 1X/week        Progress Toward Goals  OT Goals(current goals can now be found in the care plan section)  Progress towards OT goals: Progressing toward goals  Acute Rehab OT Goals OT Goal Formulation: With patient Time For Goal Achievement: 04/04/23 Potential to Achieve Goals: Good ADL Goals Pt Will Perform Eating: with min assist;bed level Pt Will Perform Grooming: with min assist;bed level Pt Will Perform Upper Body Dressing: with set-up;bed level Pt Will Perform Lower Body Dressing: with max assist Additional ADL Goal #1: Pt will complete bed mobility with mod assist to aid in ADLs  Plan      Co-evaluation                 AM-PAC OT "6 Clicks" Daily Activity     Outcome Measure   Help from another person eating meals?: A Little Help from another person taking care of personal grooming?: A Lot Help from another person toileting, which includes using toliet, bedpan, or urinal?: Total Help from another person bathing (including washing, rinsing, drying)?: Total Help from another person to put on and taking off regular upper body clothing?: A Lot Help from another person to put on and taking off regular lower body clothing?: Total 6 Click Score: 10    End of Session    OT Visit Diagnosis: Muscle weakness (generalized) (M62.81);History of falling (Z91.81);Feeding difficulties (R63.3);Other symptoms and signs involving cognitive function   Activity Tolerance Patient tolerated treatment well   Patient Left in bed;with call bell/phone within reach;with family/visitor present   Nurse Communication Mobility status        Time: 5621-3086 OT Time Calculation (min): 27 min  Charges: OT General Charges $OT Visit: 1 Visit OT Treatments $Self  Care/Home Management : 23-37 mins  Ivor Messier, OT  Acute Rehabilitation Services Office [phone] Secure chat preferred   [name] 03/23/2023, 11:45 AM

## 2023-03-23 NOTE — TOC Progression Note (Addendum)
 Transition of Care [facility]) - Progression Note    Patient Details  Name: Sharon Ramos MRN: [id number] Date of Birth: 1940/02/17  Transition of Care [facility]) CM/SW Contact  Delilah Shan, LCSWA Phone Number: 03/23/2023, 10:57 AM  Clinical Narrative:     CSW received insurance authorization approval for patient. Plan Auth ID# [id number]. Auth ID# [id number]. Insurance authorization approval from 3/21-3/25. Kia with [facility] infomed CSW that facility can accept patient today if medically ready. CSW informed MD. Patients daughter requested that Shawn with [facility] reach out to discuss outpatient palliative services. CSW informed Shawn with [facility].CSW will continue to follow.  Update- CSW spoke with Kia with [facility] who confirmed facility will follow up with patient to confirm if agreeable to outpatient palliative services referral. CSW informed patients daughter and Ines Bloomer with [facility].  Expected Discharge Plan: Home w Home Health Services Barriers to Discharge: Continued Medical Work up, SNF Pending bed offer  Expected Discharge Plan and Services In-house Referral: Clinical Social Work Discharge Planning Services: CM Consult Post Acute Care Choice: Home Health, Durable Medical Equipment Living arrangements for the past 2 months: Single Family Home                 DME Arranged: Hospital bed, High strength lightweight manual wheelchair with seat cushion, Bedside commode (hoyer lift) DME Agency: [employer] Date DME Agency Contacted: 03/12/23 Time DME Agency Contacted: 1330 Representative spoke with at DME Agency: Vaughan Basta HH Arranged: PT, OT, Nurse's Aide HH Agency: [facility] Date [facility] Agency Contacted: 03/12/23 Time HH Agency Contacted: 1335 Representative spoke with at [facility] Agency: Kandee Keen   Social Determinants of Health (SDOH) Interventions SDOH Screenings   Food Insecurity: No Food Insecurity (03/04/2023)  Housing: Low Risk  (03/04/2023)  Transportation Needs: No  Transportation Needs (03/04/2023)  Utilities: Not At Risk (03/04/2023)  Social Connections: Moderately Isolated (03/04/2023)  Tobacco Use: Low Risk  (03/05/2023)    Readmission Risk Interventions     No data to display

## 2023-03-23 NOTE — Plan of Care (Signed)
   Problem: Education: Goal: Knowledge of General Education information will improve Description Including pain rating scale, medication(s)/side effects and non-pharmacologic comfort measures Outcome: Progressing   Problem: Health Behavior/Discharge Planning: Goal: Ability to manage health-related needs will improve Outcome: Progressing

## 2023-03-23 NOTE — TOC Transition Note (Signed)
 Transition of Care [facility]) - Discharge Note   Patient Details  Name: Sharon Ramos MRN: [id number] Date of Birth: 1940/01/28  Transition of Care [facility]) CM/SW Contact:  Delilah Shan, LCSWA Phone Number: 03/23/2023, 1:03 PM   Clinical Narrative:     Patient will DC to: [facility]   Anticipated DC date: 03/23/2023  Family notified: Olegario Messier  Transport by: Sharin Mons  ?  Per MD patient ready for DC to [facility] . RN, patient, patient's family, and facility notified of DC. Discharge Summary sent to facility. RN given number for report [phone] [id number]. DC packet on chart. Ambulance transport requested for patient.  CSW signing off.   Final next level of care: Skilled Nursing Facility Barriers to Discharge: No Barriers Identified   Patient Goals and CMS Choice Patient states their goals for this hospitalization and ongoing recovery are:: SNF   Choice offered to / list presented to : Adult Children      Discharge Placement              Patient chooses bed at: [facility] Patient to be transferred to facility by: [facility] Name of family member notified: Olegario Messier Patient and family notified of of transfer: 03/23/23  Discharge Plan and Services Additional resources added to the After Visit Summary for   In-house Referral: Clinical Social Work Discharge Planning Services: CM Consult Post Acute Care Choice: Home Health, Durable Medical Equipment          DME Arranged: Hospital bed, High strength lightweight manual wheelchair with seat cushion, Bedside commode (hoyer lift) DME Agency: [employer] Date DME Agency Contacted: 03/12/23 Time DME Agency Contacted: 1330 Representative spoke with at DME Agency: Vaughan Basta HH Arranged: PT, OT, Nurse's Aide HH Agency: [facility] Date [facility] Agency Contacted: 03/12/23 Time HH Agency Contacted: 1335 Representative spoke with at [facility] Agency: Kandee Keen  Social Drivers of Health (SDOH) Interventions SDOH Screenings   Food  Insecurity: No Food Insecurity (03/04/2023)  Housing: Low Risk  (03/04/2023)  Transportation Needs: No Transportation Needs (03/04/2023)  Utilities: Not At Risk (03/04/2023)  Social Connections: Moderately Isolated (03/04/2023)  Tobacco Use: Low Risk  (03/05/2023)     Readmission Risk Interventions     No data to display

## 2023-03-23 NOTE — Progress Notes (Signed)
 Physical Therapy Wound Evaluation and Treatment Patient Details  Name: Sharon Ramos MRN: [id number] Date of Birth: March 25, 1940  Today's Date: 03/23/2023 Time: 1004-1100 Time Calculation (min): 56 min  Subjective  Subjective Assessment Subjective: Pt's daughter present initially and interpreting for [place] until medical interpreter arrived. Pt pleasant and agreeable to wound therapy session. Patient and Family Stated Goals: Rehab today Date of Onset:  (Unsure) Prior Treatments: Dressing changes  Pain Score:  Pt premedicated with IV Dilaudid and slept through most of session.   Wound Assessment  Pressure Injury 03/23/23 Sacrum Unstageable - Full thickness tissue loss in which the base of the injury is covered by slough (yellow, tan, gray, green or brown) and/or eschar (tan, brown or black) in the wound bed. (Active)  Wound Image  Before debridement  After Debridement 03/23/23 1106  Dressing Type Foam - Lift dressing to assess site every shift;Gauze (Comment);Normal saline moist dressing;Barrier Film (skin prep);Moist to moist 03/23/23 1106  Dressing Clean, Dry, Intact;Changed 03/23/23 1106  Dressing Change Frequency Daily 03/23/23 1106  State of Healing Eschar 03/23/23 1106  Site / Wound Assessment Yellow;Pink;Black 03/23/23 1106  % Wound base Red or Granulating 25% 03/23/23 1106  % Wound base Yellow/Fibrinous Exudate 60% 03/23/23 1106  % Wound base Black/Eschar 15% 03/23/23 1106  % Wound base Other/Granulation Tissue (Comment) 0% 03/23/23 1106  Peri-wound Assessment Intact 03/23/23 1106  Wound Length (cm) 10.8 cm 03/23/23 1106  Wound Width (cm) 11 cm 03/23/23 1106  Wound Depth (cm) 0.7 cm 03/23/23 1106  Wound Surface Area (cm^2) 118.8 cm^2 03/23/23 1106  Wound Volume (cm^3) 83.16 cm^3 03/23/23 1106  Tunneling (cm) 0 03/23/23 1106  Undermining (cm) 0 03/23/23 1106  Margins Unattached edges (unapproximated) 03/23/23 1106  Drainage Amount Minimal 03/23/23 1106  Drainage Description  Green;Purulent 03/23/23 1106  Treatment Debridement (Selective);Irrigation;Packing (Saline gauze) 03/23/23 1106   Selective Debridement (non-excisional) Selective Debridement (non-excisional) - Location: Sacrum Selective Debridement (non-excisional) - Tools Used: Forceps, Scalpel Selective Debridement (non-excisional) - Tissue Removed: Sacrum    Wound Assessment and Plan  Wound Therapy - Assess/Plan/Recommendations Wound Therapy - Clinical Statement: This patient presents to wound therapy with unstagable pressure injury to the sacrum. Debridement initiated. Noted blue/green drainage on dressing upon removal. Recommend Dakin's solution 1/4 strength for 3 days and transition to Medihoney or Santyl on necrotic areas until clean. Daughter reports the plan is for transfer to rehab today - recommend an air mattress upon arrival. This patient will benefit from continued wound therapy for selective removal of unviable tissue, to decrease bioburden, and promote wound bed healing. Wound Therapy - Functional Problem List: Decreased tolerance for position changes, RLE weakness, global weakness consistent with admitting diagnosis and prolonged hospital stay. Factors Delaying/Impairing Wound Healing: Immobility, Multiple medical problems, Incontinence Hydrotherapy Plan: Debridement, Dressing change, Patient/family education Wound Therapy - Frequency: 2X / week Wound Therapy - Follow Up Recommendations: dressing changes by RN  Wound Therapy Goals- Improve the function of patient's integumentary system by progressing the wound(s) through the phases of wound healing (inflammation - proliferation - remodeling) by: Wound Therapy Goals - Improve the function of patient's integumentary system by progressing the wound(s) through the phases of wound healing by: Decrease Necrotic Tissue to: 20% Decrease Necrotic Tissue - Progress: Goal set today Increase Granulation Tissue to: 80% Increase Granulation Tissue -  Progress: Goal set today Goals/treatment plan/discharge plan were made with and agreed upon by patient/family: Yes Time For Goal Achievement: 7 days Wound Therapy - Potential for Goals: Good  Goals will  be updated until maximal potential achieved or discharge criteria met.  Discharge criteria: when goals achieved, discharge from hospital, MD decision/surgical intervention, no progress towards goals, refusal/missing three consecutive treatments without notification or medical reason.  GP     Charges PT Wound Care Charges $Wound Debridement up to 20 cm: < or equal to 20 cm $ Wound Debridement each add'l 20 sqcm: 4 $PT Hydrotherapy Visit: 1 Visit       Marylynn Pearson 03/23/2023, 11:23 AM  Conni Slipper, PT, DPT Acute Rehabilitation Services Secure Chat Preferred Office: [phone]

## 2023-03-26 DIAGNOSIS — Z9289 Personal history of other medical treatment: Secondary | ICD-10-CM

## 2023-03-26 HISTORY — DX: Personal history of other medical treatment: Z92.89

## 2023-04-17 ENCOUNTER — Ambulatory Visit: Payer: Self-pay

## 2023-04-18 ENCOUNTER — Encounter (HOSPITAL_COMMUNITY): Payer: Self-pay | Admitting: Student

## 2023-04-19 ENCOUNTER — Other Ambulatory Visit: Payer: Self-pay

## 2023-04-19 ENCOUNTER — Encounter (HOSPITAL_COMMUNITY): Payer: Self-pay | Admitting: Student

## 2023-04-19 NOTE — Anesthesia Preprocedure Evaluation (Addendum)
 Anesthesia Evaluation  Patient identified by MRN, date of birth, ID band Patient awake    Reviewed: Allergy & Precautions, NPO status , Patient's Chart, lab work & pertinent test results  History of Anesthesia Complications Negative for: history of anesthetic complications  Airway Mallampati: III  TM Distance: >3 FB Neck ROM: Full    Dental  (+) Dental Advisory Given   Pulmonary neg pulmonary ROS   Pulmonary exam normal        Cardiovascular hypertension, Pt. on medications Normal cardiovascular exam   '25 TTE - EF 60 to 65%. There is mild left ventricular hypertrophy of the basal-septal segment. Grade II diastolic dysfunction (pseudonormalization). The right ventricular size is mildly enlarged. Left atrial size was severely dilated. No significant valvular d/o identified.    Neuro/Psych  PSYCHIATRIC DISORDERS Anxiety    Dementia  Hearing loss     GI/Hepatic negative GI ROS, Neg liver ROS,,,  Endo/Other   Obesity   Renal/GU CRFRenal disease Bladder dysfunction      Musculoskeletal  (+) Arthritis ,    Abdominal   Peds  Hematology  On eliquis     Anesthesia Other Findings   Reproductive/Obstetrics                             Anesthesia Physical Anesthesia Plan  ASA: 3  Anesthesia Plan: General   Post-op Pain Management: Tylenol  PO (pre-op)*   Induction: Intravenous  PONV Risk Score and Plan: 3 and Treatment may vary due to age or medical condition, Ondansetron  and TIVA  Airway Management Planned: LMA  Additional Equipment: None  Intra-op Plan:   Post-operative Plan: Extubation in OR  Informed Consent: I have reviewed the patients History and Physical, chart, labs and discussed the procedure including the risks, benefits and alternatives for the proposed anesthesia with the patient or authorized representative who has indicated his/her understanding and acceptance.      Dental advisory given, Consent reviewed with POA and Interpreter used for interview  Plan Discussed with: CRNA and Anesthesiologist  Anesthesia Plan Comments:        Anesthesia Quick Evaluation

## 2023-04-19 NOTE — Progress Notes (Signed)
 SDW CALL  Patient is deaf and has dementia. Pre-op instructions given to patient''s daughter,Kathy Raap, over the phone. The opportunity was given for the Ms. Kuras to ask questions. No further questions asked. Ms. Camerer verbalized understanding of instructions given.   PCP - Margretta Shi Combs,DO Cardiologist -deneis   PPM/ICD - denies Device Orders -  Rep Notified -   Chest x-ray - 03/17/23 EKG - 03/18/23 Stress Test - denies ECHO - 03/15/23 Cardiac Cath - denies  Sleep Study - denies CPAP -   Fasting Blood Sugar - na Checks Blood Sugar _____ times a day  Blood Thinner Instructions:na Aspirin Instructions:na  ERAS Protcol -no- pt's daughter states pt will not drink anything after midnight. She will be asleep. PRE-SURGERY Ensure or G2-   COVID TEST- na   Anesthesia review: no  Patient denies shortness of breath, fever, cough and chest pain over the phone call       Oral Hygiene is also important to reduce your risk of infection.  Remember - BRUSH YOUR TEETH THE MORNING OF SURGERY WITH YOUR REGULAR TOOTHPASTE

## 2023-04-19 NOTE — H&P (Addendum)
 [facility]opaedic Trauma Service (OTS) H&P  Patient ID: Sharon Ramos MRN: [id number] DOB/AGE: 1940/06/03 83 y.o.  Reason for surgery: Wound dehiscence RLE  HPI: Sharon Ramos is a 83 y.o. female with bilateral sensorineural hearing loss, dementia, hypertension presenting for surgery on right lower extremity.  Patient sustained a fall in March 2025, resulting in a right distal femur fracture.  Patient underwent ORIF of this fracture with Dr. Jena Gauss on 03/05/2023.  Patient had a prolonged hospital course complicated by pneumonia.  She was discharged to [facility] health rehab facility on 03/23/2023.  Patient presented to the OTS clinic on 04/17/2023 (6 weeks postoperatively) for her first postop appointment and was noted to have large wound dehiscence through the right distal femur incision.  Patient's daughter notes that the wound started to dehisce about 1 week ago.  About 11 days prior to my evaluation, daughter notes there was a small amount of drainage from the central portion of the incision after the right leg was presumably bumped on an object during transfer at the facility.  Patient was started on 10-day course of doxycycline at that time but due to baseline dementia Refusing the medication.  Per her daughter, she only received about 3 days of the medication.  Upon my evaluation, patient denies any significant pain to the right lower extremity.  Denies any fever or chills.  No nausea or vomiting.  Patient has been able to put some weight through the right leg but has primarily been mobilizing with a wheelchair.  Patient discharged from [facility] health rehab facility on 04/14/2023.  Prior to initial injury, patient was not on any anticoagulation.  Following surgery, she was discharged on Eliquis 2.5 mg twice daily  Past Medical History:  Diagnosis Date   Auditory hallucinations 11/17/2020   Dementia (HCC)    History of blood transfusion 03/26/2023   History of kidney stones    Hypertension     Sensorineural hearing loss (SNHL) of both ears 10/11/2022   in [place]   Past Surgical History:  Procedure Laterality Date   KNEE SURGERY     10-15 years ago   ORIF FEMUR FRACTURE Right 03/05/2023   Procedure: OPEN REDUCTION INTERNAL FIXATION (ORIF) DISTAL FEMUR FRACTURE;  Surgeon: Roby Lofts, MD;  Location: [facility] OR;  Service: Orthopedics;  Laterality: Right;   No family history on file.  Social History:  reports that she has never smoked. She has never used smokeless tobacco. She reports that she does not currently use alcohol after a past usage of about 21.0 standard drinks of alcohol per week. She reports that she does not use drugs.  Allergies: No Known Allergies  Medications: Prior to Admission medications   Medication Sig Start Date End Date Taking? Authorizing Provider  amLODipine (NORVASC) 5 MG tablet Take 5 mg by mouth daily. 04/21/20  Yes [provider]  Biotin 40981 MCG TABS Take 1 tablet by mouth daily.   Yes [provider]  brimonidine (ALPHAGAN) 0.2 % ophthalmic solution Place 1 drop into both eyes 2 (two) times daily.   Yes [provider]  CALCIUM-MAGNESIUM-ZINC PO Take 1 tablet by mouth daily.   Yes [provider]  clonazePAM (KLONOPIN) 0.5 MG tablet Take 1 tablet (0.5 mg total) by mouth daily. Patient taking differently: Take 0.5 mg by mouth at bedtime. 03/23/23  Yes Pahwani, Daleen Bo, MD  dorzolamide-timolol (COSOPT) 2-0.5 % ophthalmic solution Place 1 drop into both eyes 2 (two) times daily. 03/23/23  Yes Hughie Closs, MD  escitalopram (  LEXAPRO) 10 MG tablet Take 10 mg by mouth daily. 04/17/22  Yes [provider]  latanoprost (XALATAN) 0.005 % ophthalmic solution Place 1 drop into both eyes at bedtime.   Yes [provider]  memantine (NAMENDA) 10 MG tablet Take 1 tablet (10 mg total) by mouth 2 (two) times daily. Patient taking differently: Take 5 mg by mouth 2 (two) times daily. 07/26/22  Yes Marcos Eke, PA-C   mirabegron ER (MYRBETRIQ) 25 MG TB24 tablet Take 25 mg by mouth at bedtime.   Yes [provider]  sulfamethoxazole-trimethoprim (BACTRIM DS) 800-160 MG tablet Take 1 tablet by mouth 2 (two) times daily. 04/17/23  Yes [provider]  apixaban (ELIQUIS) 2.5 MG TABS tablet Take 1 tablet (2.5 mg total) by mouth 2 (two) times daily for 14 days. Patient not taking: Reported on 04/18/2023 03/23/23 04/06/23  Hughie Closs, MD  cholecalciferol (CHOLECALCIFEROL) 25 MCG tablet Take 1 tablet (1,000 Units total) by mouth daily. Patient not taking: Reported on 04/18/2023 03/24/23 06/22/23  Hughie Closs, MD  oxybutynin (DITROPAN) 5 MG tablet Take 1 tablet (5 mg total) by mouth 3 (three) times daily. Patient not taking: Reported on 04/18/2023 03/23/23 04/22/23  Hughie Closs, MD   I have reviewed the patient's current medications.  Positive ROS: All other systems have been reviewed and were otherwise negative with the exception of those mentioned in the HPI and as above.  Exam: There were no vitals taken for this visit. General: Alert and oriented, no acute distress Cardiovascular: No pedal edema Respiratory: No cyanosis, no use of accessory musculature GI: No organomegaly, abdomen is soft and non-tender Skin: + Wound dehiscence RLE Neurologic: Sensation intact distally Psychiatric: Patient is competent for consent with normal mood and affect  Musculoskeletal: Right lower extremity: Large amount of dehiscence to surgical wound.  Purulent drainage.  No exposed hardware.  Subcutaneous fat visible.  No significant surrounding redness or warmth.  No extreme tenderness with palpation around the incision or about the knee itself.  Patient tolerates gentle knee range of motion passively.  No significant calf tenderness.  Tolerates gentle ankle range of motion.  Motor/sensory function at baseline.  Neurovascularly intact.  Left lower extremity: Skin without lesions. No tenderness to palpation. Full  painless ROM, full strength in each muscle group without evidence of instability. Motor/sensory function at baseline. Neurovascularly intact.   Medical Decision Making: Data: Imaging: AP and lateral views of the right knee/femur show stable appearance to distal femur fracture fixation.  No signs of any hardware failure or loosening.  Fracture alignment remains appropriate.  Labs: No results found for this or any previous visit (from the past 24 hours).   Imaging or Labs ordered: No results found for this or any previous visit (from the past week).   Medical history and chart was reviewed and case discussed with attending provider.  Assessment/Plan: 83 year old female status post ORIF of right distal femur fracture 03/05/2023, presenting with wound dehiscence  The patient unfortunately has a large amount of wound dehiscence.  I do not feel that antibiotics alone will treat this.  I would recommend proceeding with formal irrigation and debridement of the right lower extremity with possible need for skin graft substitute placement versus closure of the wound.  Risks and benefits of the procedure have been discussed with the patient and her daughter.  They both agree to proceed with surgery.  Consent will be obtained.  Will plan to discharge the patient home postoperatively and allow her to continue  weightbearing as tolerated on the right lower extremity  Edilia Gordon PA-C [facility] [phone] (office) [email or web address]

## 2023-04-20 ENCOUNTER — Encounter (HOSPITAL_COMMUNITY): Payer: Self-pay | Admitting: Student

## 2023-04-20 ENCOUNTER — Other Ambulatory Visit: Payer: Self-pay

## 2023-04-20 ENCOUNTER — Ambulatory Visit (HOSPITAL_COMMUNITY): Admitting: Anesthesiology

## 2023-04-20 ENCOUNTER — Encounter (HOSPITAL_COMMUNITY)
Admission: RE | Disposition: A | Discharge status: Home / Self Care | Payer: Self-pay | Source: Home / Self Care | Attending: Student

## 2023-04-20 ENCOUNTER — Ambulatory Visit (HOSPITAL_BASED_OUTPATIENT_CLINIC_OR_DEPARTMENT_OTHER): Admitting: Anesthesiology

## 2023-04-20 ENCOUNTER — Ambulatory Visit: Payer: Self-pay | Admitting: Student

## 2023-04-20 ENCOUNTER — Ambulatory Visit (HOSPITAL_COMMUNITY)
Admission: RE | Admit: 2023-04-20 | Discharge: 2023-04-20 | Disposition: A | Discharge status: Home / Self Care | Attending: Student | Admitting: Student

## 2023-04-20 DIAGNOSIS — T8130XA Disruption of wound, unspecified, initial encounter: Secondary | ICD-10-CM

## 2023-04-20 DIAGNOSIS — N184 Chronic kidney disease, stage 4 (severe): Secondary | ICD-10-CM

## 2023-04-20 DIAGNOSIS — F0390 Unspecified dementia without behavioral disturbance: Secondary | ICD-10-CM | POA: Insufficient documentation

## 2023-04-20 DIAGNOSIS — Y838 Other surgical procedures as the cause of abnormal reaction of the patient, or of later complication, without mention of misadventure at the time of the procedure: Secondary | ICD-10-CM | POA: Diagnosis not present

## 2023-04-20 DIAGNOSIS — T8131XA Disruption of external operation (surgical) wound, not elsewhere classified, initial encounter: Secondary | ICD-10-CM | POA: Diagnosis present

## 2023-04-20 DIAGNOSIS — I10 Essential (primary) hypertension: Secondary | ICD-10-CM | POA: Diagnosis not present

## 2023-04-20 DIAGNOSIS — F419 Anxiety disorder, unspecified: Secondary | ICD-10-CM

## 2023-04-20 DIAGNOSIS — I129 Hypertensive chronic kidney disease with stage 1 through stage 4 chronic kidney disease, or unspecified chronic kidney disease: Secondary | ICD-10-CM

## 2023-04-20 DIAGNOSIS — H903 Sensorineural hearing loss, bilateral: Secondary | ICD-10-CM | POA: Insufficient documentation

## 2023-04-20 HISTORY — DX: Chronic kidney disease, unspecified: N18.9

## 2023-04-20 HISTORY — DX: Unspecified osteoarthritis, unspecified site: M19.90

## 2023-04-20 HISTORY — DX: Unspecified dementia, unspecified severity, without behavioral disturbance, psychotic disturbance, mood disturbance, and anxiety: F03.90

## 2023-04-20 HISTORY — PX: INCISION AND DRAINAGE OF WOUND: SHX1803

## 2023-04-20 LAB — COMPREHENSIVE METABOLIC PANEL WITH GFR
ALT: 10 U/L (ref 0–44)
AST: 21 U/L (ref 15–41)
Albumin: 2.5 g/dL — ABNORMAL LOW (ref 3.5–5.0)
Alkaline Phosphatase: 52 U/L (ref 38–126)
Anion gap: 11 (ref 5–15)
BUN: 42 mg/dL — ABNORMAL HIGH (ref 8–23)
CO2: 25 mmol/L (ref 22–32)
Calcium: 8.9 mg/dL (ref 8.9–10.3)
Chloride: 104 mmol/L (ref 98–111)
Creatinine, Ser: 2.02 mg/dL — ABNORMAL HIGH (ref 0.44–1.00)
GFR, Estimated: 24 mL/min — ABNORMAL LOW (ref >60)
Glucose, Bld: 115 mg/dL — ABNORMAL HIGH (ref 70–99)
Potassium: 5 mmol/L (ref 3.5–5.1)
Sodium: 140 mmol/L (ref 135–145)
Total Bilirubin: 0.9 mg/dL (ref 0.0–1.2)
Total Protein: 7 g/dL (ref 6.5–8.1)

## 2023-04-20 LAB — CBC WITH DIFFERENTIAL/PLATELET
Abs Immature Granulocytes: 0.14 10*3/uL — ABNORMAL HIGH (ref 0.00–0.07)
Basophils Absolute: 0 10*3/uL (ref 0.0–0.1)
Basophils Relative: 0 %
Eosinophils Absolute: 0.2 10*3/uL (ref 0.0–0.5)
Eosinophils Relative: 2 %
HCT: 30.4 % — ABNORMAL LOW (ref 36.0–46.0)
Hemoglobin: 9.2 g/dL — ABNORMAL LOW (ref 12.0–15.0)
Immature Granulocytes: 2 %
Lymphocytes Relative: 9 %
Lymphs Abs: 0.9 10*3/uL (ref 0.7–4.0)
MCH: 27.5 pg (ref 26.0–34.0)
MCHC: 30.3 g/dL (ref 30.0–36.0)
MCV: 91 fL (ref 80.0–100.0)
Monocytes Absolute: 0.9 10*3/uL (ref 0.1–1.0)
Monocytes Relative: 9 %
Neutro Abs: 7.4 10*3/uL (ref 1.7–7.7)
Neutrophils Relative %: 78 %
Platelets: 300 10*3/uL (ref 150–400)
RBC: 3.34 MIL/uL — ABNORMAL LOW (ref 3.87–5.11)
RDW: 19.4 % — ABNORMAL HIGH (ref 11.5–15.5)
WBC: 9.4 10*3/uL (ref 4.0–10.5)
nRBC: 0.2 % (ref 0.0–0.2)

## 2023-04-20 LAB — POCT I-STAT, CHEM 8
BUN: 42 mg/dL — ABNORMAL HIGH (ref 8–23)
Calcium, Ion: 1.06 mmol/L — ABNORMAL LOW (ref 1.15–1.40)
Chloride: 108 mmol/L (ref 98–111)
Creatinine, Ser: 2.4 mg/dL — ABNORMAL HIGH (ref 0.44–1.00)
Glucose, Bld: 113 mg/dL — ABNORMAL HIGH (ref 70–99)
HCT: 32 % — ABNORMAL LOW (ref 36.0–46.0)
Hemoglobin: 10.9 g/dL — ABNORMAL LOW (ref 12.0–15.0)
Potassium: 5.9 mmol/L — ABNORMAL HIGH (ref 3.5–5.1)
Sodium: 140 mmol/L (ref 135–145)
TCO2: 23 mmol/L (ref 22–32)

## 2023-04-20 SURGERY — IRRIGATION AND DEBRIDEMENT WOUND
Anesthesia: General | Laterality: Right

## 2023-04-20 MED ORDER — PHENYLEPHRINE 80 MCG/ML (10ML) SYRINGE FOR IV PUSH (FOR BLOOD PRESSURE SUPPORT)
PREFILLED_SYRINGE | INTRAVENOUS | Status: DC | PRN
Start: 2023-04-20 — End: 2023-04-20
  Administered 2023-04-20 (×2): 240 ug via INTRAVENOUS

## 2023-04-20 MED ORDER — PHENYLEPHRINE 80 MCG/ML (10ML) SYRINGE FOR IV PUSH (FOR BLOOD PRESSURE SUPPORT)
PREFILLED_SYRINGE | INTRAVENOUS | Status: AC
  Filled 2023-04-20: qty 10

## 2023-04-20 MED ORDER — FENTANYL CITRATE (PF) 250 MCG/5ML IJ SOLN
INTRAMUSCULAR | Status: DC | PRN
  Administered 2023-04-20: 25 ug via INTRAVENOUS

## 2023-04-20 MED ORDER — PROPOFOL 10 MG/ML IV BOLUS
INTRAVENOUS | Status: DC | PRN
Start: 2023-04-20 — End: 2023-04-20
  Administered 2023-04-20: 100 ug/kg/min via INTRAVENOUS
  Administered 2023-04-20: 30 mg via INTRAVENOUS
  Administered 2023-04-20: 100 mg via INTRAVENOUS

## 2023-04-20 MED ORDER — FENTANYL CITRATE (PF) 250 MCG/5ML IJ SOLN
INTRAMUSCULAR | Status: AC
  Filled 2023-04-20: qty 5

## 2023-04-20 MED ORDER — CEFAZOLIN SODIUM-DEXTROSE 2-4 GM/100ML-% IV SOLN
2.0000 g | INTRAVENOUS | Status: AC
  Administered 2023-04-20: 2 g via INTRAVENOUS
  Filled 2023-04-20: qty 100

## 2023-04-20 MED ORDER — VANCOMYCIN HCL 1000 MG IV SOLR
INTRAVENOUS | Status: AC
  Filled 2023-04-20: qty 20

## 2023-04-20 MED ORDER — SODIUM CHLORIDE 0.9 % IV SOLN: INTRAVENOUS | Status: DC

## 2023-04-20 MED ORDER — OXYCODONE HCL 5 MG/5ML PO SOLN: 5.0000 mg | Freq: Once | ORAL | Status: DC | PRN

## 2023-04-20 MED ORDER — MIDAZOLAM HCL 2 MG/2ML IJ SOLN
INTRAMUSCULAR | Status: AC
  Filled 2023-04-20: qty 2

## 2023-04-20 MED ORDER — PROPOFOL 10 MG/ML IV BOLUS
INTRAVENOUS | Status: AC
  Filled 2023-04-20: qty 20

## 2023-04-20 MED ORDER — LIDOCAINE 2% (20 MG/ML) 5 ML SYRINGE
INTRAMUSCULAR | Status: DC | PRN
  Administered 2023-04-20: 60 mg via INTRAVENOUS

## 2023-04-20 MED ORDER — ACETAMINOPHEN 500 MG PO TABS: 1000.0000 mg | ORAL_TABLET | Freq: Once | ORAL | Status: DC

## 2023-04-20 MED ORDER — OXYCODONE HCL 5 MG PO TABS: 5.0000 mg | ORAL_TABLET | Freq: Once | ORAL | Status: DC | PRN

## 2023-04-20 MED ORDER — PHENYLEPHRINE HCL-NACL 20-0.9 MG/250ML-% IV SOLN
INTRAVENOUS | Status: DC | PRN
  Administered 2023-04-20: 80 ug/min via INTRAVENOUS

## 2023-04-20 MED ORDER — CHLORHEXIDINE GLUCONATE 0.12 % MT SOLN: 15.0000 mL | Freq: Once | OROMUCOSAL | Status: AC

## 2023-04-20 MED ORDER — ONDANSETRON HCL 4 MG/2ML IJ SOLN
INTRAMUSCULAR | Status: DC | PRN
Start: 2023-04-20 — End: 2023-04-20
  Administered 2023-04-20: 4 mg via INTRAVENOUS

## 2023-04-20 MED ORDER — ONDANSETRON HCL 4 MG/2ML IJ SOLN
INTRAMUSCULAR | Status: AC
  Filled 2023-04-20: qty 2

## 2023-04-20 MED ORDER — 0.9 % SODIUM CHLORIDE (POUR BTL) OPTIME
TOPICAL | Status: DC | PRN
  Administered 2023-04-20: 1000 mL

## 2023-04-20 MED ORDER — FENTANYL CITRATE (PF) 100 MCG/2ML IJ SOLN: 25.0000 ug | INTRAMUSCULAR | Status: DC | PRN

## 2023-04-20 MED ORDER — CHLORHEXIDINE GLUCONATE 0.12 % MT SOLN
OROMUCOSAL | Status: AC
Start: 2023-04-20 — End: 2023-04-20
  Administered 2023-04-20: 15 mL via OROMUCOSAL
  Filled 2023-04-20: qty 15

## 2023-04-20 MED ORDER — ACETAMINOPHEN 500 MG PO TABS: 500.0000 mg | ORAL_TABLET | Freq: Four times a day (QID) | ORAL | Status: AC | PRN

## 2023-04-20 MED ORDER — ONDANSETRON HCL 4 MG/2ML IJ SOLN: 4.0000 mg | Freq: Once | INTRAMUSCULAR | Status: DC | PRN

## 2023-04-20 MED ORDER — VANCOMYCIN HCL 1000 MG IV SOLR
INTRAVENOUS | Status: DC | PRN
  Administered 2023-04-20: 1000 mg via TOPICAL

## 2023-04-20 MED ORDER — LIDOCAINE 2% (20 MG/ML) 5 ML SYRINGE
INTRAMUSCULAR | Status: AC
  Filled 2023-04-20: qty 5

## 2023-04-20 MED ORDER — ORAL CARE MOUTH RINSE: 15.0000 mL | Freq: Once | OROMUCOSAL | Status: AC

## 2023-04-20 MED ORDER — SODIUM CHLORIDE 0.9 % IR SOLN
Status: DC | PRN
  Administered 2023-04-20: 3000 mL

## 2023-04-20 MED ORDER — DOXYCYCLINE HYCLATE 100 MG PO CAPS: 100.0000 mg | ORAL_CAPSULE | Freq: Two times a day (BID) | ORAL | 0 refills | Status: AC

## 2023-04-20 SURGICAL SUPPLY — 42 items
BAG COUNTER SPONGE SURGICOUNT (BAG) ×1 IMPLANT
BNDG COHESIVE 4X5 TAN STRL LF (GAUZE/BANDAGES/DRESSINGS) ×1 IMPLANT
BNDG ELASTIC 6INX 5YD STR LF (GAUZE/BANDAGES/DRESSINGS) IMPLANT
BNDG GAUZE DERMACEA FLUFF 4 (GAUZE/BANDAGES/DRESSINGS) ×2 IMPLANT
BRUSH SCRUB EZ PLAIN DRY (MISCELLANEOUS) ×2 IMPLANT
CHLORAPREP W/TINT 26 (MISCELLANEOUS) ×1 IMPLANT
COVER MAYO STAND STRL (DRAPES) ×1 IMPLANT
COVER SURGICAL LIGHT HANDLE (MISCELLANEOUS) ×2 IMPLANT
DRAPE SURG 17X23 STRL (DRAPES) ×1 IMPLANT
DRAPE SURG ORHT 6 SPLT 77X108 (DRAPES) ×1 IMPLANT
DRAPE U-SHAPE 47X51 STRL (DRAPES) ×1 IMPLANT
DRSG ADAPTIC 3X8 NADH LF (GAUZE/BANDAGES/DRESSINGS) ×1 IMPLANT
DRSG MEPITEL 4X7.2 (GAUZE/BANDAGES/DRESSINGS) IMPLANT
ELECT REM PT RETURN 9FT ADLT (ELECTROSURGICAL) IMPLANT
ELECTRODE REM PT RTRN 9FT ADLT (ELECTROSURGICAL) IMPLANT
EVACUATOR 1/8 PVC DRAIN (DRAIN) IMPLANT
GAUZE SPONGE 4X4 12PLY STRL (GAUZE/BANDAGES/DRESSINGS) ×1 IMPLANT
GLOVE BIO SURGEON STRL SZ 6.5 (GLOVE) ×3 IMPLANT
GLOVE BIO SURGEON STRL SZ7.5 (GLOVE) ×4 IMPLANT
GLOVE BIOGEL PI IND STRL 6.5 (GLOVE) ×1 IMPLANT
GLOVE BIOGEL PI IND STRL 7.5 (GLOVE) ×1 IMPLANT
GOWN STRL REUS W/ TWL LRG LVL3 (GOWN DISPOSABLE) ×2 IMPLANT
KIT BASIN OR (CUSTOM PROCEDURE TRAY) ×1 IMPLANT
KIT TURNOVER KIT B (KITS) ×1 IMPLANT
MANIFOLD NEPTUNE II (INSTRUMENTS) ×1 IMPLANT
NS IRRIG 1000ML POUR BTL (IV SOLUTION) ×1 IMPLANT
PACK ORTHO EXTREMITY (CUSTOM PROCEDURE TRAY) ×1 IMPLANT
PAD ARMBOARD POSITIONER FOAM (MISCELLANEOUS) ×2 IMPLANT
PADDING CAST COTTON 6X4 STRL (CAST SUPPLIES) ×1 IMPLANT
SET HNDPC FAN SPRY TIP SCT (DISPOSABLE) IMPLANT
SPONGE T-LAP 18X18 ~~LOC~~+RFID (SPONGE) ×1 IMPLANT
SUT ETHILON 2 0 FS 18 (SUTURE) ×2 IMPLANT
SUT ETHILON 3 0 PS 1 (SUTURE) ×2 IMPLANT
SUT MON AB 2-0 CT1 36 (SUTURE) ×1 IMPLANT
SUT PDS AB 0 CT 36 (SUTURE) IMPLANT
SWAB CULTURE ESWAB REG 1ML (MISCELLANEOUS) IMPLANT
TOWEL GREEN STERILE (TOWEL DISPOSABLE) ×2 IMPLANT
TOWEL GREEN STERILE FF (TOWEL DISPOSABLE) ×1 IMPLANT
TUBE CONNECTING 12X1/4 (SUCTIONS) ×1 IMPLANT
UNDERPAD 30X36 HEAVY ABSORB (UNDERPADS AND DIAPERS) ×1 IMPLANT
WATER STERILE IRR 1000ML POUR (IV SOLUTION) ×1 IMPLANT
YANKAUER SUCT BULB TIP NO VENT (SUCTIONS) ×1 IMPLANT

## 2023-04-20 NOTE — Op Note (Signed)
 Orthopaedic Surgery Operative Note (CSN: [id number] ) Date of Surgery: 04/20/2023  Admit Date: 04/20/2023   Diagnoses: Pre-Op Diagnoses: Right knee wound dehiscence   Post-Op Diagnosis: Same  Procedures: CPT 10180-Irrigation and debridement of right leg postoperative infection CPT 11042-Irrigation and debridement of right leg subcutaneous tissue  Surgeons : Primary: Kendal Franky SQUIBB, MD  Assistant: Lauraine Moores, PA-C  Location: OR 3   Anesthesia: General   Antibiotics: Ancef  2g preop with 1 gm vancomycin  powder placed topically   Tourniquet time: None    Estimated Blood Loss: Minimal  Complications:* No complications entered in OR log *   Specimens: ID Type Source Tests Collected by Time Destination  A : RIGHT KNEE LEG WOUND Tissue Soft Tissue, Other AEROBIC/ANAEROBIC CULTURE W GRAM STAIN (SURGICAL/DEEP WOUND) Kendal Franky SQUIBB, MD 04/20/2023 0831      Implants: * No implants in log *   Indications for Surgery: 83 year old female who underwent open reduction internal fixation of right distal femur fracture on 03/05/2023.  She subsequently developed wound dehiscence and breakdown over the last week.  Due to the possibility of deep infection I recommend proceeding with irrigation debridement with primary closure of the wound.  Risk benefits were discussed with the patient and her daughter.  Risks include limited to bleeding, infection, persistent drainage, persistent wound breakdown, soft tissue coverage, need for hardware removal, and possible anesthetic complications.  They agreed to proceed with surgery consent was obtained.  Operative Findings: 1.  Irrigation and debridement of right leg wound dehiscence with subcutaneous debridement with no extension deep to the IT band.  Procedure: The patient was identified in the preoperative holding area. Consent was confirmed with the patient and their family and all questions were answered. The operative extremity was marked after  confirmation with the patient. she was then brought back to the operating room by our anesthesia colleagues.  She was placed under general anesthetic and carefully transferred over to radiolucent flattop table.  A bump was placed into her operative hip.  The right lower extremity was then prepped and draped in usual sterile fashion.  A timeout was performed to verify the patient, the procedure, and the extremity.  Preoperative antibiotics were dosed.  I proceeded to excise the eschar that was in place as well as the purulent subcutaneous fat and soft tissue layer.  I extended this proximally to excise the skin edges to get back to healthy skin.  I used a Cobb elevator to debride the subcutaneous area.  It did not probe deep to the IT band.  There was no active purulence.  I did send the soft tissue for culture.  I then used low-pressure pulsatile lavage to thoroughly irrigate the wound.  I then placed a gram vancomycin  powder.  I closed the wound with 2-0 nylon suture.  Sterile dressing was applied.  The patient was then awoke from anesthesia and taken to the PACU in stable condition.   Debridement type: Excisional Debridement  Side: right  Body Location: Knee  Tools used for debridement: scalpel, curette, and rongeur  Pre-debridement Wound size (cm):   Length: 6        Width: 2.5     Depth: 0.5   Post-debridement Wound size (cm):   N/A-closed  Debridement depth beyond dead/damaged tissue down to healthy viable tissue: yes  Tissue layer involved: skin, subcutaneous tissue  Nature of tissue removed: Necrotic and Devitalized Tissue  Irrigation volume: 1L     Irrigation fluid type: Normal Saline   Post  Op Plan/Instructions: Patient will continue to be weightbearing as tolerated to the right lower extremity.  To discharge home from the PACU.  Continue on oral antibiotics and follow-up the cultures.  Will plan to see her back in approximately 2 weeks for repeat x-rays.  I was present and  performed the entire surgery.  Lauraine Moores, PA-C did assist me throughout the case. An assistant was necessary given the difficulty in approach, maintenance of reduction and ability to instrument the fracture.   Franky Light, MD [facility]

## 2023-04-20 NOTE — Discharge Instructions (Addendum)
 Orthopaedic Trauma Service Discharge Instructions   General Discharge Instructions  WEIGHT BEARING STATUS:Weightbearing as tolerated  RANGE OF MOTION/ACTIVITY: Ok for knee range of motion as tolerated  Wound Care: You may remove your surgical dressing on Monday 04/23/23 Incisions can be left open to air if there is no drainage. Once the incision is completely dry and without drainage, it may be left open to air out.  Showering may begin Tuesday 04/24/23.  Clean incision gently with soap and water .  DVT/PE prophylaxis: None  Diet: as you were eating previously.  Can use over the counter stool softeners and bowel preparations, such as Miralax , to help with bowel movements.  Narcotics can be constipating.  Be sure to drink plenty of fluids  PAIN MEDICATION USE AND EXPECTATIONS  You have likely been given narcotic medications to help control your pain.  After a traumatic event that results in an fracture (broken bone) with or without surgery, it is ok to use narcotic pain medications to help control one's pain.  We understand that everyone responds to pain differently and each individual patient will be evaluated on a regular basis for the continued need for narcotic medications. Ideally, narcotic medication use should last no more than 6-8 weeks (coinciding with fracture healing).   As a patient it is your responsibility as well to monitor narcotic medication use and report the amount and frequency you use these medications when you come to your office visit.   We would also advise that if you are using narcotic medications, you should take a dose prior to therapy to maximize you participation.  IF YOU ARE ON NARCOTIC MEDICATIONS IT IS NOT PERMISSIBLE TO OPERATE A MOTOR VEHICLE (MOTORCYCLE/CAR/TRUCK/MOPED) OR HEAVY MACHINERY DO NOT MIX NARCOTICS WITH OTHER CNS (CENTRAL NERVOUS SYSTEM) DEPRESSANTS SUCH AS ALCOHOL  POST-OPERATIVE OPIOID TAPER INSTRUCTIONS: It is important to wean off of your  opioid medication as soon as possible. If you do not need pain medication after your surgery it is ok to stop day one. Opioids include: Codeine, Hydrocodone (Norco, Vicodin), Oxycodone (Percocet, oxycontin ) and hydromorphone  amongst others.  Long term and even short term use of opiods can cause: Increased pain response Dependence Constipation Depression Respiratory depression And more.  Withdrawal symptoms can include Flu like symptoms Nausea, vomiting And more Techniques to manage these symptoms Hydrate well Eat regular healthy meals Stay active Use relaxation techniques(deep breathing, meditating, yoga) Do Not substitute Alcohol to help with tapering If you have been on opioids for less than two weeks and do not have pain than it is ok to stop all together.  Plan to wean off of opioids This plan should start within one week post op of your fracture surgery  Maintain the same interval or time between taking each dose and first decrease the dose.  Cut the total daily intake of opioids by one tablet each day Next start to increase the time between doses. The last dose that should be eliminated is the evening dose.    STOP SMOKING OR USING NICOTINE PRODUCTS!!!!  As discussed nicotine severely impairs your body's ability to heal surgical and traumatic wounds but also impairs bone healing.  Wounds and bone heal by forming microscopic blood vessels (angiogenesis) and nicotine is a vasoconstrictor (essentially, shrinks blood vessels).  Therefore, if vasoconstriction occurs to these microscopic blood vessels they essentially disappear and are unable to deliver necessary nutrients to the healing tissue.  This is one modifiable factor that you can do to dramatically increase your chances of healing  your injury.  (This means no smoking, no nicotine gum, patches, etc)  DO NOT USE NONSTEROIDAL ANTI-INFLAMMATORY DRUGS (NSAID'S)  Using products such as Advil (ibuprofen), Aleve (naproxen), Motrin  (ibuprofen) for additional pain control during fracture healing can delay and/or prevent the healing response.  If you would like to take over the counter (OTC) medication, Tylenol  (acetaminophen ) is ok.  However, some narcotic medications that are given for pain control contain acetaminophen  as well. Therefore, you should not exceed more than 4000 mg of tylenol  in a day if you do not have liver disease.  Also note that there are may OTC medicines, such as cold medicines and allergy medicines that my contain tylenol  as well.  If you have any questions about medications and/or interactions please ask your doctor/PA or your pharmacist.      ICE AND ELEVATE INJURED/OPERATIVE EXTREMITY  Using ice and elevating the injured extremity above your heart can help with swelling and pain control.  Icing in a pulsatile fashion, such as 20 minutes on and 20 minutes off, can be followed.    Do not place ice directly on skin. Make sure there is a barrier between to skin and the ice pack.    Using frozen items such as frozen peas works well as the conform nicely to the are that needs to be iced.  USE AN ACE WRAP OR TED HOSE FOR SWELLING CONTROL  In addition to icing and elevation, Ace wraps or TED hose are used to help limit and resolve swelling.  It is recommended to use Ace wraps or TED hose until you are informed to stop.    When using Ace Wraps start the wrapping distally (farthest away from the body) and wrap proximally (closer to the body)   Example: If you had surgery on your leg or thing and you do not have a splint on, start the ace wrap at the toes and work your way up to the thigh        If you had surgery on your upper extremity and do not have a splint on, start the ace wrap at your fingers and work your way up to the upper arm  CALL THE OFFICE FOR MEDICATION REFILLS OR WITH ANY QUESTIONS/CONCERNS: [phone]   VISIT OUR WEBSITE FOR ADDITIONAL INFORMATION: [email or web address]   Discharge Wound Care  Instructions  Do NOT apply any ointments, solutions or lotions to pin sites or surgical wounds.  These prevent needed drainage and even though solutions like hydrogen peroxide kill bacteria, they also damage cells lining the pin sites that help fight infection.  Applying lotions or ointments can keep the wounds moist and can cause them to breakdown and open up as well. This can increase the risk for infection. When in doubt call the office.  Surgical incisions should be dressed daily.  If any drainage is noted, use one layer of adaptic or Mepitel, then gauze, Kerlix, and an ace wrap. - These dressing supplies should be available at local medical supply stores ([facility], [facility], etc) as well as Insurance claims handler ([facility], [facility], [place], etc)  Once the incision is completely dry and without drainage, it may be left open to air out.  Showering may begin 36-48 hours later.  Cleaning gently with soap and water .  Traumatic wounds should be dressed daily as well.    One layer of adaptic, gauze, Kerlix, then ace wrap.  The adaptic can be discontinued once the draining has ceased    If you have  a wet to dry dressing: wet the gauze with saline the squeeze as much saline out so the gauze is moist (not soaking wet), place moistened gauze over wound, then place a dry gauze over the moist one, followed by Kerlix wrap, then ace wrap.    Call office for the following: Temperature greater than 101F Persistent nausea and vomiting Severe uncontrolled pain Redness, tenderness, or signs of infection (pain, swelling, redness, odor or green/yellow discharge around the site) Difficulty breathing, headache or visual disturbances Hives Persistent dizziness or light-headedness Extreme fatigue Any other questions or concerns you may have after discharge  In an emergency, call 911 or go to an Emergency Department at a nearby hospital  OTHER HELPFUL INFORMATION  If you had a block, it will wear off  between 8-24 hrs postop typically.  This is period when your pain may go from nearly zero to the pain you would have had postop without the block.  This is an abrupt transition but nothing dangerous is happening.  You may take an extra dose of narcotic when this happens.  You should wean off your narcotic medicines as soon as you are able.  Most patients will be off or using minimal narcotics before their first postop appointment.   We suggest you use the pain medication the first night prior to going to bed, in order to ease any pain when the anesthesia wears off. You should avoid taking pain medications on an empty stomach as it will make you nauseous.  Do not drink alcoholic beverages or take illicit drugs when taking pain medications.  In most states it is against the law to drive while you are in a splint or sling.  And certainly against the law to drive while taking narcotics.  You may return to work/school in the next couple of days when you feel up to it.   Pain medication may make you constipated.  Below are a few solutions to try in this order: Decrease the amount of pain medication if you aren't having pain. Drink lots of decaffeinated fluids. Drink prune juice and/or each dried prunes  If the first 3 don't work start with additional solutions Take Colace - an over-the-counter stool softener Take Senokot - an over-the-counter laxative Take Miralax  - a stronger over-the-counter laxative

## 2023-04-20 NOTE — Anesthesia Postprocedure Evaluation (Signed)
 Anesthesia Post Note  Patient: Sharon Ramos  Procedure(s) Performed: IRRIGATION AND DEBRIDEMENT WOUND (Right)     Patient location during evaluation: PACU Anesthesia Type: General Level of consciousness: awake (mute, at baseline subjectively) Pain management: pain level controlled Vital Signs Assessment: post-procedure vital signs reviewed and stable Respiratory status: spontaneous breathing, nonlabored ventilation and respiratory function stable Cardiovascular status: stable and blood pressure returned to baseline Anesthetic complications: no  No notable events documented.  Last Vitals:  Vitals:   04/20/23 0915 04/20/23 0930  BP: 118/61 121/62  Pulse: 75 75  Resp: 20 18  Temp:  36.6 C  SpO2: 97% 95%    Last Pain:  Vitals:   04/20/23 0930  TempSrc:   PainSc: 0-No pain                 Juventino Oppenheim

## 2023-04-20 NOTE — Progress Notes (Signed)
 Dr. Glenetta Lane made aware of CMP result. Ok to proceed per Dr. Glenetta Lane.

## 2023-04-20 NOTE — Anesthesia Procedure Notes (Signed)
 Procedure Name: LMA Insertion Date/Time: 04/20/2023 8:09 AM  Performed by: Artemisa Bile D, CRNAPre-anesthesia Checklist: Patient identified, Emergency Drugs available, Suction available and Patient being monitored Patient Re-evaluated:Patient Re-evaluated prior to induction Oxygen Delivery Method: Circle System Utilized Preoxygenation: Pre-oxygenation with 100% oxygen Induction Type: IV induction Ventilation: Mask ventilation without difficulty LMA: LMA inserted LMA Size: 4.0 Number of attempts: 1 Airway Equipment and Method: Bite block Placement Confirmation: positive ETCO2 Tube secured with: Tape Dental Injury: Teeth and Oropharynx as per pre-operative assessment

## 2023-04-20 NOTE — Interval H&P Note (Signed)
 History and Physical Interval Note:  04/20/2023 7:23 AM  Sharon Ramos  has presented today for surgery, with the diagnosis of post operative wound dehisence.  The various methods of treatment have been discussed with the patient and family. After consideration of risks, benefits and other options for treatment, the patient has consented to  Procedure(s): IRRIGATION AND DEBRIDEMENT WOUND (Right) as a surgical intervention.  The patient's history has been reviewed, patient examined, no change in status, stable for surgery.  I have reviewed the patient's chart and labs.  Questions were answered to the patient's satisfaction.     [name]

## 2023-04-20 NOTE — Transfer of Care (Signed)
 Immediate Anesthesia Transfer of Care Note  Patient: Sharon Ramos  Procedure(s) Performed: IRRIGATION AND DEBRIDEMENT WOUND (Right)  Patient Location: PACU  Anesthesia Type:General  Level of Consciousness: drowsy  Airway & Oxygen Therapy: Patient Spontanous Breathing and Patient connected to face mask oxygen  Post-op Assessment: Report given to RN and Post -op Vital signs reviewed and stable  Post vital signs: Reviewed and stable  Last Vitals:  Vitals Value Taken Time  BP 102/54 04/20/23 0857  Temp 36.2 C 04/20/23 0857  Pulse 74 04/20/23 0857  Resp 20 04/20/23 0859  SpO2 100 % 04/20/23 0857  Vitals shown include unfiled device data.  Last Pain:  Vitals:   04/20/23 0718  TempSrc:   PainSc: 5          Complications: No notable events documented.

## 2023-04-21 ENCOUNTER — Encounter (HOSPITAL_COMMUNITY): Payer: Self-pay | Admitting: Student

## 2023-04-25 ENCOUNTER — Encounter (HOSPITAL_COMMUNITY): Payer: Self-pay | Admitting: Student

## 2023-04-25 LAB — AEROBIC/ANAEROBIC CULTURE W GRAM STAIN (SURGICAL/DEEP WOUND)

## 2023-05-01 ENCOUNTER — Telehealth (INDEPENDENT_AMBULATORY_CARE_PROVIDER_SITE_OTHER): Admitting: Physician Assistant

## 2023-05-01 DIAGNOSIS — F0280 Dementia in other diseases classified elsewhere without behavioral disturbance: Secondary | ICD-10-CM

## 2023-05-01 DIAGNOSIS — G309 Alzheimer's disease, unspecified: Secondary | ICD-10-CM
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
## 2023-05-01 NOTE — Patient Instructions (Addendum)
 Follow up Oct 30 at 11:30

## 2023-05-16 ENCOUNTER — Other Ambulatory Visit: Payer: Self-pay | Admitting: Physician Assistant

## 2023-06-03 DEATH — deceased
